# Patient Record
Sex: Female | Born: 1985
Health system: Southern US, Community
[De-identification: ages and names within clinical notes are randomized; demographics above are authoritative.]

## PROBLEM LIST (undated history)

## (undated) ENCOUNTER — Inpatient Hospital Stay (HOSPITAL_COMMUNITY): Payer: Self-pay

## (undated) DIAGNOSIS — T7840XA Allergy, unspecified, initial encounter: Secondary | ICD-10-CM

## (undated) DIAGNOSIS — L309 Dermatitis, unspecified: Secondary | ICD-10-CM

## (undated) DIAGNOSIS — H8110 Benign paroxysmal vertigo, unspecified ear: Secondary | ICD-10-CM

## (undated) DIAGNOSIS — N730 Acute parametritis and pelvic cellulitis: Secondary | ICD-10-CM

## (undated) DIAGNOSIS — F329 Major depressive disorder, single episode, unspecified: Secondary | ICD-10-CM

## (undated) DIAGNOSIS — K449 Diaphragmatic hernia without obstruction or gangrene: Secondary | ICD-10-CM

## (undated) DIAGNOSIS — D649 Anemia, unspecified: Secondary | ICD-10-CM

## (undated) DIAGNOSIS — K5090 Crohn's disease, unspecified, without complications: Secondary | ICD-10-CM

## (undated) DIAGNOSIS — N390 Urinary tract infection, site not specified: Secondary | ICD-10-CM

## (undated) DIAGNOSIS — K209 Esophagitis, unspecified without bleeding: Secondary | ICD-10-CM

## (undated) DIAGNOSIS — R011 Cardiac murmur, unspecified: Secondary | ICD-10-CM

## (undated) DIAGNOSIS — A549 Gonococcal infection, unspecified: Secondary | ICD-10-CM

## (undated) DIAGNOSIS — N200 Calculus of kidney: Secondary | ICD-10-CM

## (undated) DIAGNOSIS — A630 Anogenital (venereal) warts: Secondary | ICD-10-CM

## (undated) DIAGNOSIS — H9190 Unspecified hearing loss, unspecified ear: Secondary | ICD-10-CM

## (undated) DIAGNOSIS — A749 Chlamydial infection, unspecified: Secondary | ICD-10-CM

## (undated) DIAGNOSIS — O139 Gestational [pregnancy-induced] hypertension without significant proteinuria, unspecified trimester: Secondary | ICD-10-CM

## (undated) DIAGNOSIS — F419 Anxiety disorder, unspecified: Secondary | ICD-10-CM

## (undated) DIAGNOSIS — L509 Urticaria, unspecified: Secondary | ICD-10-CM

## (undated) DIAGNOSIS — K2970 Gastritis, unspecified, without bleeding: Secondary | ICD-10-CM

## (undated) DIAGNOSIS — F32A Depression, unspecified: Secondary | ICD-10-CM

## (undated) DIAGNOSIS — F1910 Other psychoactive substance abuse, uncomplicated: Secondary | ICD-10-CM

## (undated) HISTORY — DX: Anemia, unspecified: D64.9

## (undated) HISTORY — PX: OTHER SURGICAL HISTORY: SHX169

## (undated) HISTORY — DX: Urticaria, unspecified: L50.9

## (undated) HISTORY — DX: Allergy, unspecified, initial encounter: T78.40XA

## (undated) HISTORY — DX: Dermatitis, unspecified: L30.9

## (undated) HISTORY — DX: Other psychoactive substance abuse, uncomplicated: F19.10

---
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
## 2006-01-24 ENCOUNTER — Inpatient Hospital Stay (HOSPITAL_COMMUNITY): Admission: AD | Admit: 2006-01-24 | Discharge: 2006-01-24 | Payer: Self-pay | Admitting: Obstetrics

## 2006-02-17 ENCOUNTER — Inpatient Hospital Stay (HOSPITAL_COMMUNITY): Admission: AD | Admit: 2006-02-17 | Discharge: 2006-02-20 | Payer: Self-pay | Admitting: Obstetrics

## 2006-03-04 ENCOUNTER — Inpatient Hospital Stay (HOSPITAL_COMMUNITY): Admission: AD | Admit: 2006-03-04 | Discharge: 2006-03-05 | Payer: Self-pay | Admitting: Obstetrics

## 2006-03-18 ENCOUNTER — Inpatient Hospital Stay (HOSPITAL_COMMUNITY): Admission: AD | Admit: 2006-03-18 | Discharge: 2006-03-18 | Payer: Self-pay | Admitting: Obstetrics

## 2006-03-22 ENCOUNTER — Inpatient Hospital Stay (HOSPITAL_COMMUNITY): Admission: AD | Admit: 2006-03-22 | Discharge: 2006-03-24 | Payer: Self-pay | Admitting: Obstetrics

## 2006-05-21 ENCOUNTER — Inpatient Hospital Stay (HOSPITAL_COMMUNITY): Admission: AD | Admit: 2006-05-21 | Discharge: 2006-05-21 | Payer: Self-pay | Admitting: Obstetrics

## 2006-05-31 ENCOUNTER — Inpatient Hospital Stay (HOSPITAL_COMMUNITY): Admission: AD | Admit: 2006-05-31 | Discharge: 2006-05-31 | Payer: Self-pay | Admitting: Obstetrics

## 2006-06-05 ENCOUNTER — Inpatient Hospital Stay (HOSPITAL_COMMUNITY): Admission: AD | Admit: 2006-06-05 | Discharge: 2006-06-06 | Payer: Self-pay | Admitting: Obstetrics

## 2006-06-06 ENCOUNTER — Inpatient Hospital Stay (HOSPITAL_COMMUNITY): Admission: AD | Admit: 2006-06-06 | Discharge: 2006-06-06 | Payer: Self-pay | Admitting: Obstetrics

## 2006-06-12 ENCOUNTER — Inpatient Hospital Stay (HOSPITAL_COMMUNITY): Admission: AD | Admit: 2006-06-12 | Discharge: 2006-06-14 | Payer: Self-pay | Admitting: Obstetrics

## 2006-06-16 ENCOUNTER — Inpatient Hospital Stay (HOSPITAL_COMMUNITY): Admission: AD | Admit: 2006-06-16 | Discharge: 2006-06-16 | Payer: Self-pay | Admitting: Obstetrics

## 2006-08-10 ENCOUNTER — Emergency Department (HOSPITAL_COMMUNITY): Admission: EM | Admit: 2006-08-10 | Discharge: 2006-08-10 | Payer: Self-pay | Admitting: Emergency Medicine

## 2007-02-04 ENCOUNTER — Emergency Department (HOSPITAL_COMMUNITY): Admission: EM | Admit: 2007-02-04 | Discharge: 2007-02-05 | Payer: Self-pay | Admitting: Emergency Medicine

## 2007-05-18 ENCOUNTER — Emergency Department (HOSPITAL_COMMUNITY): Admission: EM | Admit: 2007-05-18 | Discharge: 2007-05-19 | Payer: Self-pay | Admitting: Emergency Medicine

## 2007-08-19 ENCOUNTER — Emergency Department (HOSPITAL_COMMUNITY): Admission: EM | Admit: 2007-08-19 | Discharge: 2007-08-19 | Payer: Self-pay | Admitting: *Deleted

## 2007-12-31 ENCOUNTER — Emergency Department (HOSPITAL_COMMUNITY): Admission: EM | Admit: 2007-12-31 | Discharge: 2007-12-31 | Payer: Self-pay | Admitting: Emergency Medicine

## 2008-02-10 ENCOUNTER — Emergency Department (HOSPITAL_COMMUNITY): Admission: EM | Admit: 2008-02-10 | Discharge: 2008-02-10 | Payer: Self-pay | Admitting: Emergency Medicine

## 2008-03-21 ENCOUNTER — Emergency Department (HOSPITAL_COMMUNITY): Admission: EM | Admit: 2008-03-21 | Discharge: 2008-03-21 | Payer: Self-pay | Admitting: Emergency Medicine

## 2008-04-30 ENCOUNTER — Emergency Department (HOSPITAL_COMMUNITY): Admission: EM | Admit: 2008-04-30 | Discharge: 2008-04-30 | Payer: Self-pay | Admitting: Emergency Medicine

## 2008-06-07 ENCOUNTER — Emergency Department (HOSPITAL_COMMUNITY): Admission: EM | Admit: 2008-06-07 | Discharge: 2008-06-07 | Payer: Self-pay | Admitting: Emergency Medicine

## 2008-06-10 ENCOUNTER — Emergency Department (HOSPITAL_COMMUNITY): Admission: EM | Admit: 2008-06-10 | Discharge: 2008-06-10 | Payer: Self-pay | Admitting: *Deleted

## 2008-07-22 ENCOUNTER — Emergency Department (HOSPITAL_COMMUNITY): Admission: EM | Admit: 2008-07-22 | Discharge: 2008-07-22 | Payer: Self-pay | Admitting: Emergency Medicine

## 2008-08-12 ENCOUNTER — Emergency Department (HOSPITAL_COMMUNITY): Admission: EM | Admit: 2008-08-12 | Discharge: 2008-08-12 | Payer: Self-pay | Admitting: Emergency Medicine

## 2008-11-22 ENCOUNTER — Emergency Department (HOSPITAL_COMMUNITY): Admission: EM | Admit: 2008-11-22 | Discharge: 2008-11-22 | Payer: Self-pay | Admitting: Emergency Medicine

## 2009-01-02 ENCOUNTER — Emergency Department (HOSPITAL_COMMUNITY): Admission: EM | Admit: 2009-01-02 | Discharge: 2009-01-02 | Payer: Self-pay | Admitting: Emergency Medicine

## 2009-06-17 ENCOUNTER — Emergency Department (HOSPITAL_COMMUNITY): Admission: EM | Admit: 2009-06-17 | Discharge: 2009-06-17 | Payer: Self-pay | Admitting: Emergency Medicine

## 2009-06-20 ENCOUNTER — Emergency Department (HOSPITAL_COMMUNITY): Admission: EM | Admit: 2009-06-20 | Discharge: 2009-06-21 | Payer: Self-pay | Admitting: Emergency Medicine

## 2009-07-19 ENCOUNTER — Inpatient Hospital Stay (HOSPITAL_COMMUNITY): Admission: AD | Admit: 2009-07-19 | Discharge: 2009-07-19 | Payer: Self-pay | Admitting: Obstetrics

## 2010-03-15 ENCOUNTER — Emergency Department (HOSPITAL_COMMUNITY): Admission: EM | Admit: 2010-03-15 | Discharge: 2010-03-15 | Payer: Self-pay | Admitting: Emergency Medicine

## 2010-04-01 ENCOUNTER — Inpatient Hospital Stay (HOSPITAL_COMMUNITY): Admission: EM | Admit: 2010-04-01 | Discharge: 2010-04-05 | Payer: Self-pay | Admitting: Emergency Medicine

## 2010-04-03 ENCOUNTER — Encounter (INDEPENDENT_AMBULATORY_CARE_PROVIDER_SITE_OTHER): Payer: Self-pay | Admitting: Internal Medicine

## 2010-04-06 ENCOUNTER — Emergency Department (HOSPITAL_COMMUNITY): Admission: EM | Admit: 2010-04-06 | Discharge: 2010-04-06 | Payer: Self-pay | Admitting: Emergency Medicine

## 2010-06-29 ENCOUNTER — Emergency Department (HOSPITAL_COMMUNITY): Admission: EM | Admit: 2010-06-29 | Discharge: 2010-03-26 | Payer: Self-pay | Admitting: Emergency Medicine

## 2010-08-13 ENCOUNTER — Encounter: Payer: Self-pay | Admitting: Obstetrics

## 2010-10-05 LAB — URINE CULTURE
Culture  Setup Time: 201109111102
Special Requests: NEGATIVE

## 2010-10-05 LAB — HIV ANTIBODY (ROUTINE TESTING W REFLEX): HIV: NONREACTIVE

## 2010-10-05 LAB — DIFFERENTIAL
Basophils Absolute: 0 10*3/uL (ref 0.0–0.1)
Basophils Absolute: 0 10*3/uL (ref 0.0–0.1)
Basophils Absolute: 0 10*3/uL (ref 0.0–0.1)
Basophils Absolute: 0 10*3/uL (ref 0.0–0.1)
Basophils Relative: 0 % (ref 0–1)
Basophils Relative: 1 % (ref 0–1)
Basophils Relative: 1 % (ref 0–1)
Eosinophils Absolute: 0.1 10*3/uL (ref 0.0–0.7)
Eosinophils Absolute: 0.1 10*3/uL (ref 0.0–0.7)
Eosinophils Relative: 2 % (ref 0–5)
Eosinophils Relative: 2 % (ref 0–5)
Eosinophils Relative: 3 % (ref 0–5)
Lymphocytes Relative: 33 % (ref 12–46)
Lymphocytes Relative: 35 % (ref 12–46)
Lymphocytes Relative: 38 % (ref 12–46)
Lymphs Abs: 1.2 10*3/uL (ref 0.7–4.0)
Lymphs Abs: 1.2 10*3/uL (ref 0.7–4.0)
Lymphs Abs: 2.1 10*3/uL (ref 0.7–4.0)
Monocytes Absolute: 0.4 10*3/uL (ref 0.1–1.0)
Monocytes Relative: 11 % (ref 3–12)
Monocytes Relative: 13 % — ABNORMAL HIGH (ref 3–12)
Neutro Abs: 2 10*3/uL (ref 1.7–7.7)
Neutro Abs: 2.3 10*3/uL (ref 1.7–7.7)
Neutro Abs: 3 10*3/uL (ref 1.7–7.7)
Neutrophils Relative %: 53 % (ref 43–77)
Neutrophils Relative %: 39 % — ABNORMAL LOW (ref 43–77)
Neutrophils Relative %: 46 % (ref 43–77)
Neutrophils Relative %: 49 % (ref 43–77)
Neutrophils Relative %: 50 % (ref 43–77)

## 2010-10-05 LAB — C-REACTIVE PROTEIN: CRP: 0.7 mg/dL — ABNORMAL HIGH (ref <0.6)

## 2010-10-05 LAB — CBC
HCT: 42.4 % (ref 36.0–46.0)
HCT: 35.4 % — ABNORMAL LOW (ref 36.0–46.0)
HCT: 36.2 % (ref 36.0–46.0)
HCT: 38.1 % (ref 36.0–46.0)
HCT: 40.7 % (ref 36.0–46.0)
Hemoglobin: 14 g/dL (ref 12.0–15.0)
Hemoglobin: 11.9 g/dL — ABNORMAL LOW (ref 12.0–15.0)
Hemoglobin: 13.3 g/dL (ref 12.0–15.0)
MCH: 27.2 pg (ref 26.0–34.0)
MCH: 27.1 pg (ref 26.0–34.0)
MCH: 27.6 pg (ref 26.0–34.0)
MCH: 28 pg (ref 26.0–34.0)
MCHC: 33 g/dL (ref 30.0–36.0)
MCHC: 32.7 g/dL (ref 30.0–36.0)
MCHC: 32.8 g/dL (ref 30.0–36.0)
MCHC: 33.3 g/dL (ref 30.0–36.0)
MCV: 82.5 fL (ref 78.0–100.0)
MCV: 82.8 fL (ref 78.0–100.0)
MCV: 83.1 fL (ref 78.0–100.0)
MCV: 83.4 fL (ref 78.0–100.0)
Platelets: 234 10*3/uL (ref 150–400)
Platelets: 216 10*3/uL (ref 150–400)
Platelets: 218 10*3/uL (ref 150–400)
RBC: 5.14 MIL/uL — ABNORMAL HIGH (ref 3.87–5.11)
RBC: 4.59 MIL/uL (ref 3.87–5.11)
RBC: 4.91 MIL/uL (ref 3.87–5.11)
RDW: 14.3 % (ref 11.5–15.5)
RDW: 13.4 % (ref 11.5–15.5)
RDW: 14.4 % (ref 11.5–15.5)
RDW: 14.4 % (ref 11.5–15.5)
WBC: 3.7 10*3/uL — ABNORMAL LOW (ref 4.0–10.5)
WBC: 3.2 10*3/uL — ABNORMAL LOW (ref 4.0–10.5)
WBC: 4.5 10*3/uL (ref 4.0–10.5)

## 2010-10-05 LAB — COMPREHENSIVE METABOLIC PANEL
ALT: 12 U/L (ref 0–35)
AST: 23 U/L (ref 0–37)
AST: 17 U/L (ref 0–37)
Albumin: 3.6 g/dL (ref 3.5–5.2)
Alkaline Phosphatase: 37 U/L — ABNORMAL LOW (ref 39–117)
Alkaline Phosphatase: 34 U/L — ABNORMAL LOW (ref 39–117)
BUN: 6 mg/dL (ref 6–23)
BUN: 1 mg/dL — ABNORMAL LOW (ref 6–23)
BUN: 2 mg/dL — ABNORMAL LOW (ref 6–23)
BUN: 6 mg/dL (ref 6–23)
CO2: 25 mEq/L (ref 19–32)
CO2: 24 mEq/L (ref 19–32)
CO2: 27 mEq/L (ref 19–32)
Calcium: 9.1 mg/dL (ref 8.4–10.5)
Calcium: 8.4 mg/dL (ref 8.4–10.5)
Calcium: 8.7 mg/dL (ref 8.4–10.5)
Calcium: 8.9 mg/dL (ref 8.4–10.5)
Chloride: 108 mEq/L (ref 96–112)
Chloride: 108 mEq/L (ref 96–112)
Chloride: 110 mEq/L (ref 96–112)
Chloride: 112 mEq/L (ref 96–112)
Creatinine, Ser: 1.03 mg/dL (ref 0.4–1.2)
Creatinine, Ser: 1.02 mg/dL (ref 0.4–1.2)
Creatinine, Ser: 1.16 mg/dL (ref 0.4–1.2)
Creatinine, Ser: 1.23 mg/dL — ABNORMAL HIGH (ref 0.4–1.2)
GFR calc Af Amer: >60 mL/min (ref >60)
GFR calc Af Amer: >60 mL/min (ref >60)
GFR calc non Af Amer: >60 mL/min (ref >60)
GFR calc non Af Amer: 54 mL/min — ABNORMAL LOW (ref >60)
GFR calc non Af Amer: 58 mL/min — ABNORMAL LOW (ref >60)
GFR calc non Af Amer: >60 mL/min (ref >60)
Glucose, Bld: 79 mg/dL (ref 70–99)
Glucose, Bld: 102 mg/dL — ABNORMAL HIGH (ref 70–99)
Glucose, Bld: 82 mg/dL (ref 70–99)
Glucose, Bld: 89 mg/dL (ref 70–99)
Glucose, Bld: 91 mg/dL (ref 70–99)
Potassium: 3.4 mEq/L — ABNORMAL LOW (ref 3.5–5.1)
Potassium: 3.7 mEq/L (ref 3.5–5.1)
Potassium: 3.7 mEq/L (ref 3.5–5.1)
Sodium: 140 mEq/L (ref 135–145)
Total Bilirubin: 0.7 mg/dL (ref 0.3–1.2)
Total Bilirubin: 0.4 mg/dL (ref 0.3–1.2)
Total Bilirubin: 0.5 mg/dL (ref 0.3–1.2)
Total Protein: 6.5 g/dL (ref 6.0–8.3)
Total Protein: 5.1 g/dL — ABNORMAL LOW (ref 6.0–8.3)
Total Protein: 5.4 g/dL — ABNORMAL LOW (ref 6.0–8.3)
Total Protein: 5.5 g/dL — ABNORMAL LOW (ref 6.0–8.3)

## 2010-10-05 LAB — HEMOGLOBIN A1C
Hgb A1c MFr Bld: 5.2 % (ref <5.7)
Mean Plasma Glucose: 103 mg/dL (ref <117)

## 2010-10-05 LAB — GLUCOSE, CAPILLARY
Glucose-Capillary: 110 mg/dL — ABNORMAL HIGH (ref 70–99)
Glucose-Capillary: 76 mg/dL (ref 70–99)
Glucose-Capillary: 85 mg/dL (ref 70–99)

## 2010-10-05 LAB — MAGNESIUM
Magnesium: 1.7 mg/dL (ref 1.5–2.5)
Magnesium: 1.8 mg/dL (ref 1.5–2.5)
Magnesium: 1.8 mg/dL (ref 1.5–2.5)
Magnesium: 1.9 mg/dL (ref 1.5–2.5)

## 2010-10-05 LAB — URINALYSIS, ROUTINE W REFLEX MICROSCOPIC
Bilirubin Urine: NEGATIVE
Bilirubin Urine: NEGATIVE
Bilirubin Urine: NEGATIVE
Hgb urine dipstick: NEGATIVE
Ketones, ur: NEGATIVE mg/dL
Nitrite: NEGATIVE
Nitrite: NEGATIVE
Nitrite: NEGATIVE
Specific Gravity, Urine: 1.034 — ABNORMAL HIGH (ref 1.005–1.030)
Urobilinogen, UA: 0.2 mg/dL (ref 0.0–1.0)
pH: 6 (ref 5.0–8.0)
pH: 6.5 (ref 5.0–8.0)

## 2010-10-05 LAB — URINE MICROSCOPIC-ADD ON

## 2010-10-05 LAB — CLOSTRIDIUM DIFFICILE EIA
C difficile Toxins A+B, EIA: NEGATIVE
C difficile Toxins A+B, EIA: NEGATIVE

## 2010-10-05 LAB — OVA AND PARASITE EXAMINATION

## 2010-10-05 LAB — PHOSPHORUS
Phosphorus: 3.6 mg/dL (ref 2.3–4.6)
Phosphorus: 3.7 mg/dL (ref 2.3–4.6)
Phosphorus: 4 mg/dL (ref 2.3–4.6)

## 2010-10-05 LAB — BASIC METABOLIC PANEL
CO2: 25 mEq/L (ref 19–32)
Calcium: 8.9 mg/dL (ref 8.4–10.5)
Creatinine, Ser: 0.91 mg/dL (ref 0.4–1.2)
Glucose, Bld: 105 mg/dL — ABNORMAL HIGH (ref 70–99)

## 2010-10-05 LAB — STOOL CULTURE

## 2010-10-05 LAB — POCT PREGNANCY, URINE: Preg Test, Ur: NEGATIVE

## 2010-10-05 LAB — PREGNANCY, URINE: Preg Test, Ur: NEGATIVE

## 2010-10-05 LAB — LIPASE, BLOOD: Lipase: 57 U/L (ref 11–59)

## 2010-10-05 LAB — LACTIC ACID, PLASMA: Lactic Acid, Venous: 0.6 mmol/L (ref 0.5–2.2)

## 2010-10-05 LAB — TSH: TSH: 1.591 u[IU]/mL (ref 0.350–4.500)

## 2010-10-06 LAB — URINALYSIS, ROUTINE W REFLEX MICROSCOPIC
Glucose, UA: NEGATIVE mg/dL
Protein, ur: NEGATIVE mg/dL

## 2010-10-06 LAB — URINE MICROSCOPIC-ADD ON

## 2010-10-06 LAB — WET PREP, GENITAL

## 2010-10-23 LAB — URINALYSIS, ROUTINE W REFLEX MICROSCOPIC
Glucose, UA: NEGATIVE mg/dL
Ketones, ur: 15 mg/dL — AB
pH: 6.5 (ref 5.0–8.0)

## 2010-10-23 LAB — URINE CULTURE: Colony Count: 95000

## 2010-10-23 LAB — WET PREP, GENITAL

## 2010-10-23 LAB — CBC
HCT: 37.2 % (ref 36.0–46.0)
Hemoglobin: 12.2 g/dL (ref 12.0–15.0)
RBC: 4.33 MIL/uL (ref 3.87–5.11)
WBC: 2.9 10*3/uL — ABNORMAL LOW (ref 4.0–10.5)

## 2010-10-23 LAB — URINE MICROSCOPIC-ADD ON

## 2010-10-25 LAB — POCT PREGNANCY, URINE: Preg Test, Ur: NEGATIVE

## 2010-10-25 LAB — URINALYSIS, ROUTINE W REFLEX MICROSCOPIC
Bilirubin Urine: NEGATIVE
Glucose, UA: NEGATIVE mg/dL
Ketones, ur: NEGATIVE mg/dL
Nitrite: NEGATIVE
Protein, ur: NEGATIVE mg/dL
Specific Gravity, Urine: 1.029 (ref 1.005–1.030)
Urobilinogen, UA: 1 mg/dL (ref 0.0–1.0)

## 2010-10-25 LAB — URINE MICROSCOPIC-ADD ON

## 2010-12-08 NOTE — Op Note (Signed)
Kristen Chung, HACK NO.:  [id number]   MEDICAL RECORD NO.:  [id number]          PATIENT TYPE:  AMB   LOCATION:  [place]                           FACILITY:  [facility]   PHYSICIAN:  Frederico Hamman, M.D.DATE OF BIRTH:  1985-09-30   DATE OF PROCEDURE:  12/28/2005  DATE OF DISCHARGE:                                 OPERATIVE REPORT   PREOPERATIVE DIAGNOSIS:  Incompetent cervix.   PROCEDURE:  Cervical cerclage.   SURGEON:  Frederico Hamman, M.D.   DESCRIPTION OF PROCEDURE:  After spinal anesthetic was administered, the  patient was placed in the lithotomy position.  The peritoneum and vagina  were prepped and draped.  A straight catheter with speculum placed into the  vagina.  The cervix was grasped at 12 o'clock with a sponge forceps.  A #5  Mersilene band used to place a cervical cerclage in the usual manner.  It  was tied at 6 o'clock.   The patient tolerated the procedure well.  She was taken to the recovery  room in good condition.           ______________________________  Frederico Hamman, M.D.     BAM/MEDQ  D:  12/28/2005  T:  12/28/2005  Job:  [id number]

## 2010-12-08 NOTE — Discharge Summary (Signed)
NAMENASIM, GAROFANO NO.:  [id number]   MEDICAL RECORD NO.:  [id number]          PATIENT TYPE:  INP   LOCATION:  9159                          FACILITY:  [facility]   PHYSICIAN:  Frederico Hamman, M.D.DATE OF BIRTH:  11-09-1985   DATE OF ADMISSION:  02/17/2006  DATE OF DISCHARGE:  02/20/2006                                 DISCHARGE SUMMARY   SUMMARY:  This patient is a 25 year old gravida 4, para 1, 0, 2, 1 with an  EDC of June 22, 2006.  The patient is admitted because of lower  abdominal pain and contractions.  She also has a cerclage. She received  terbutaline, but that did not stop her contractions. She was started  magnesium sulfate, 4 gram loading dose and at 22 grams per hour.  By 7:31  she had stopped contracting.  The magnesium sulfate was discontinued and she  was started Procardia 60 XL.   The patient was discharged to home on February 20, 2006 ambulatory and on a  regular diet.   DISCHARGE INSTRUCTIONS:  The patient is on total bedrest.  She is to take  Procardia 60 XL.  The patient is to see me in four weeks.   DISCHARGE DIAGNOSES:  Status post therapy for premature contractions.           ______________________________  Frederico Hamman, M.D.     BAM/MEDQ  D:  05/22/2006  T:  05/22/2006  Job:  [id number]

## 2010-12-08 NOTE — Discharge Summary (Signed)
[place]  Patient:    Kristen Chung, Kristen Chung Visit Number: [id number] MRN: [id number]          Service Type: OBS Location: 910A 9110 01 Attending Physician:  Renda Rolls Dictated by:   Bonna Gains, C.N.M. Admit Date:  07/07/2001 Discharge Date: 08/03/2001                             Discharge Summary  HISTORY OF PRESENT ILLNESS:   This is a 25 year old primigravida who presented at 66 weeks with LMP dating consistent with an 18-week scan, stating that she had spontaneous rupture of membranes with a moderate amount of clear fluid on the day of admission.  She also was experiencing some cramping.  She denied bleeding.  She had good fetal movement.  Prenatal care was at [facility] and essentially uncomplicated, but apparently had a lapse of care prior to admission for several weeks.  PAST OBSTETRICAL HISTORY:     Primigravida.  PAST GYNECOLOGICAL HISTORY:   Negative for STDs.  PAST MEDICAL HISTORY:         Noncontributory.  PAST SURGICAL HISTORY:        Noncontributory.  ALLERGIES:                    None.  MEDICATIONS:                  Prenatal vitamins.  SOCIAL HISTORY:               An adolescent, 9th grader at [employer]. Negative for substance abuse.  Significant family discord.  PRENATAL LABORATORY DATA:     A+.  Antibody screen negative.  Hemoglobin 11, platelets 227.  Rubella immune.  Hepatitis B surface antigen negative.  RPR negative.  HIV not done.  Gonorrhea negative.  Chlamydia negative.  PHYSICAL EXAMINATION:         Vital signs with temperature 98 degrees, pulse 77, and BP 122/64.  ABDOMEN:                      Soft and nontender consistent with 26-week size. No fundal tenderness.  EXTREMITIES:                  1+ DTRs.  No edema.  LABORATORY DATA:              Wet prep with a few bacteria and a few wbcs, otherwise negative.  Toco with no uterine contractions.  EFM:  The fetal heart rate was in the  140s without significant decelerations.  ASSESSMENT:                   A 25 year old primigravida at 62 weeks with spontaneous rupture of membranes, not laboring.  PLAN:                         Hospitalization.  GC, chlamydia, and GBS were done.  The plan was to give steroids, have social services consult, get her on a homebound teaching protocol, and see a nutritionist.  HOSPITAL COURSE:              On hospital day #1, the chlamydia culture came back positive.  She was treated with azithromycin 1 g p.o.  Her ultrasound showed an AFI of 4.0.  The cervical length was 4.6.  Of note, the follow-up ultrasound done  on July 30, 2001, showed AFI down to 2.4 with a cervical length of 1.7.  She received betamethasone 12.5 mg repeated once in 24 hours and was started on Unasyn IV.  Her beta streptococcus culture came back positive.  Because of the prolonged antibiotics, she was started on a PICC line for her IV antibiotics.  She received 11 days of Unasyn and was changed to ampicillin on day #12.  She remained afebrile and did not have abdominal tenderness.  Toco revealed only a rare uterine contraction.  The cervix exam was deferred.  During the hospitalization, she saw the nutritionist and the social worker and had a tour of the NICU.  On August 01, 2001, she began reporting significant lower abdominal pain.  On exam, her cervix was noted to be 4, 90, and 0 station.  That night, she went on to deliver spontaneously a female with Apgars of 2 at one minute and 7 at five minutes by Agnes Lawrence, M.D.  The infant went to the NICU as he was [redacted] weeks gestational age.  Her postpartum course was normal.  DISPOSITION:                  She was discharged on August 02, 2001.  DISCHARGE MEDICATIONS:        Prenatal vitamins one a day and iron one p.o. q.d.  FOLLOW-UP:                    Her follow-up was to be at six weeks a [employer]. Dictated by:   Bonna Gains, C.N.M. Attending  Physician:  Renda Rolls DD:  09/01/01 TD:  09/01/01 Job: [id number]

## 2010-12-08 NOTE — Group Therapy Note (Signed)
NAME:  Kristen Chung, Kristen Chung NO.:  [id number]   MEDICAL RECORD NO.:  [id number]          PATIENT TYPE:  WOC   LOCATION:  [facility]                   FACILITY:  [facility]   PHYSICIAN:  Darron Doom, MD        DATE OF BIRTH:  [birth year]   DATE OF SERVICE:  06/29/2004                                    CLINIC NOTE   CHIEF COMPLAINT:  Left breast lump.   HISTORY OF PRESENT ILLNESS:  The patient is a 25 year old G2 P0-1-1-1 who  began having some nipple pain bilaterally approximately a week ago.  Since  that time she has noted increasing pain with stretching; some onset of  dizziness, shortness of breath, and sweating; as well as new breast lump in  the left breast.  The patient was seen in the ED for this approximately 2  days ago, had a negative chest x-ray, normal pulse oximetry, normal  electrolytes, normal hemoglobin, and negative urine pregnancy test.  She was  noted to have a left breast mass at the time and she is referred here for  follow-up.   PAST MEDICAL HISTORY:  Negative.   PAST SURGICAL HISTORY:  Negative.   MEDICATIONS:  She is on naproxen.   ALLERGIES:  None known.   OBSTETRICAL HISTORY:  She is a G2 P1 with a one termination, one vaginal  delivery at 7 months after PPROM at 6 months.   GYNECOLOGICAL HISTORY:  Menarche at age 10.  Cycles every month, last 3-4  days.  She is on no birth control currently.  Her LMP is June 06, 2004.  Last Pap smear was at the [facility] in July 2005.  She does have a  history of abnormal Pap with no significant treatment done.   FAMILY HISTORY:  Significant for coronary artery disease and diabetes.   SOCIAL HISTORY:  The patient lives with her father, step-mother, foster  brother, and her 64-year-old son.  The patient also is going to school at  night, back to [employer].  She has been out of school for approximately 2  years.  She expresses a lot of anger and frustration at her current living  situation and a  long discussion was had with this patient regarding choices  and options for her.   PHYSICAL EXAMINATION:  VITAL SIGNS:  Temperature is 98.1, pulse 82, blood  pressure 132/73, weight 122.9.  GENERAL:  She is a well-developed, well-nourished black female in no acute  distress  BREASTS:  She has Tanner stage 4 breasts and she has some cystic changes.  She has everted nipples and a scant amount of clear nipple discharge is  noted on the left.  There is no supraclavicular or axillary adenopathy.  The  right breast is within normal limits.  The left breast has some cystic  change noted, as well as a firm nodule at the 12 o'clock position.  The  nodule is very small, less than a centimeter by about a half a centimeter.   IMPRESSION:  1.  Breast mass.  2.  Acute anxiety attacks.  3.  Contraceptive counseling.  PLAN:  1.  Will refer her to surgeon for evaluation of breast mass.  2.  Have given her NuvaRing samples, information, and prescription to be      used as needed for birth control.  3.  Follow up as needed.      TP/MEDQ  D:  06/29/2004  T:  06/30/2004  Job:  [id number]

## 2011-02-21 ENCOUNTER — Emergency Department (HOSPITAL_COMMUNITY)
Admission: EM | Admit: 2011-02-21 | Discharge: 2011-02-21 | Disposition: A | Discharge status: Home / Self Care | Payer: Self-pay | Attending: Emergency Medicine | Admitting: Emergency Medicine

## 2011-02-21 ENCOUNTER — Emergency Department (HOSPITAL_COMMUNITY): Payer: Self-pay

## 2011-02-21 ENCOUNTER — Encounter (HOSPITAL_COMMUNITY): Payer: Self-pay

## 2011-02-21 DIAGNOSIS — K5289 Other specified noninfective gastroenteritis and colitis: Secondary | ICD-10-CM | POA: Insufficient documentation

## 2011-02-21 DIAGNOSIS — R197 Diarrhea, unspecified: Secondary | ICD-10-CM | POA: Insufficient documentation

## 2011-02-21 DIAGNOSIS — R109 Unspecified abdominal pain: Secondary | ICD-10-CM | POA: Insufficient documentation

## 2011-02-21 LAB — URINALYSIS, ROUTINE W REFLEX MICROSCOPIC
Glucose, UA: NEGATIVE mg/dL
Hgb urine dipstick: NEGATIVE
Protein, ur: NEGATIVE mg/dL
Specific Gravity, Urine: 1.009 (ref 1.005–1.030)
pH: 6.5 (ref 5.0–8.0)

## 2011-02-21 LAB — COMPREHENSIVE METABOLIC PANEL
ALT: 17 U/L (ref 0–35)
AST: 27 U/L (ref 0–37)
Albumin: 3.6 g/dL (ref 3.5–5.2)
Alkaline Phosphatase: 59 U/L (ref 39–117)
Calcium: 9.6 mg/dL (ref 8.4–10.5)
Potassium: 3.3 mEq/L — ABNORMAL LOW (ref 3.5–5.1)
Sodium: 138 mEq/L (ref 135–145)
Total Protein: 7.2 g/dL (ref 6.0–8.3)

## 2011-02-21 LAB — URINE MICROSCOPIC-ADD ON

## 2011-02-21 LAB — CBC
Hemoglobin: 12.6 g/dL (ref 12.0–15.0)
MCH: 27.3 pg (ref 26.0–34.0)
MCHC: 31.8 g/dL (ref 30.0–36.0)
Platelets: 252 10*3/uL (ref 150–400)
RDW: 13.7 % (ref 11.5–15.5)

## 2011-02-21 LAB — DIFFERENTIAL
Basophils Absolute: 0 10*3/uL (ref 0.0–0.1)
Basophils Relative: 1 % (ref 0–1)
Eosinophils Absolute: 0.1 10*3/uL (ref 0.0–0.7)
Eosinophils Relative: 1 % (ref 0–5)
Monocytes Absolute: 0.5 10*3/uL (ref 0.1–1.0)
Neutro Abs: 2.9 10*3/uL (ref 1.7–7.7)

## 2011-02-21 MED ORDER — IOHEXOL 300 MG/ML  SOLN
100.0000 mL | Freq: Once | INTRAMUSCULAR | Status: AC | PRN
  Administered 2011-02-21: 100 mL via INTRAVENOUS

## 2011-04-12 LAB — CBC
HCT: 35.1 — ABNORMAL LOW
Hemoglobin: 11.4 — ABNORMAL LOW
MCV: 80.4
Platelets: 251
RDW: 13.2

## 2011-04-12 LAB — DIFFERENTIAL
Basophils Absolute: 0.1
Basophils Relative: 1
Lymphocytes Relative: 47 — ABNORMAL HIGH
Monocytes Absolute: 0.6
Neutro Abs: 2.6
Neutrophils Relative %: 40 — ABNORMAL LOW

## 2011-04-12 LAB — URINALYSIS, ROUTINE W REFLEX MICROSCOPIC
Nitrite: NEGATIVE
Specific Gravity, Urine: 1.021
Urobilinogen, UA: 1
pH: 7.5

## 2011-04-12 LAB — COMPREHENSIVE METABOLIC PANEL
Albumin: 3.6
Alkaline Phosphatase: 50
BUN: 10
Creatinine, Ser: 1.05
Glucose, Bld: 95
Potassium: 3.3 — ABNORMAL LOW
Total Bilirubin: 0.3
Total Protein: 6.9

## 2011-04-12 LAB — URINE MICROSCOPIC-ADD ON

## 2011-04-19 LAB — URINALYSIS, ROUTINE W REFLEX MICROSCOPIC
Glucose, UA: NEGATIVE
Ketones, ur: NEGATIVE
Nitrite: NEGATIVE
Specific Gravity, Urine: >1.03 — ABNORMAL HIGH
pH: 6

## 2011-04-19 LAB — URINE MICROSCOPIC-ADD ON

## 2011-04-19 LAB — POCT PREGNANCY, URINE
Operator id: 29502
Preg Test, Ur: NEGATIVE

## 2011-04-20 LAB — URINE MICROSCOPIC-ADD ON

## 2011-04-20 LAB — URINALYSIS, ROUTINE W REFLEX MICROSCOPIC
Bilirubin Urine: NEGATIVE
Ketones, ur: NEGATIVE
Nitrite: NEGATIVE
Protein, ur: 30 — AB
pH: 6

## 2011-04-20 LAB — PREGNANCY, URINE: Preg Test, Ur: NEGATIVE

## 2011-04-23 LAB — URINE MICROSCOPIC-ADD ON

## 2011-04-23 LAB — URINALYSIS, ROUTINE W REFLEX MICROSCOPIC
Ketones, ur: NEGATIVE
Nitrite: NEGATIVE
Protein, ur: NEGATIVE
pH: 7

## 2011-04-24 LAB — URINE MICROSCOPIC-ADD ON

## 2011-04-24 LAB — URINALYSIS, ROUTINE W REFLEX MICROSCOPIC
Bilirubin Urine: NEGATIVE
Glucose, UA: NEGATIVE
Hgb urine dipstick: NEGATIVE
Hgb urine dipstick: NEGATIVE
Ketones, ur: NEGATIVE
Protein, ur: NEGATIVE
Specific Gravity, Urine: 1.019
Urobilinogen, UA: 0.2

## 2011-04-24 LAB — RPR: RPR Ser Ql: NONREACTIVE

## 2011-04-24 LAB — POCT PREGNANCY, URINE: Preg Test, Ur: NEGATIVE

## 2011-04-24 LAB — GC/CHLAMYDIA PROBE AMP, GENITAL: Chlamydia, DNA Probe: NEGATIVE

## 2011-04-26 LAB — DIFFERENTIAL
Basophils Relative: 1 % (ref 0–1)
Eosinophils Absolute: 0 10*3/uL (ref 0.0–0.7)
Eosinophils Relative: 0 % (ref 0–5)
Monocytes Absolute: 0.2 10*3/uL (ref 0.1–1.0)
Monocytes Relative: 4 % (ref 3–12)

## 2011-04-26 LAB — POCT I-STAT, CHEM 8
Creatinine, Ser: 0.8 mg/dL (ref 0.4–1.2)
Glucose, Bld: 99 mg/dL (ref 70–99)
Hemoglobin: 13.9 g/dL (ref 12.0–15.0)
Potassium: 3.3 mEq/L — ABNORMAL LOW (ref 3.5–5.1)

## 2011-04-26 LAB — CBC
HCT: 39.2 % (ref 36.0–46.0)
Hemoglobin: 12.6 g/dL (ref 12.0–15.0)
MCHC: 32 g/dL (ref 30.0–36.0)
MCV: 82.8 fL (ref 78.0–100.0)
RBC: 4.74 MIL/uL (ref 3.87–5.11)

## 2011-04-26 LAB — POCT CARDIAC MARKERS: CKMB, poc: <1 ng/mL — ABNORMAL LOW (ref 1.0–8.0)

## 2011-05-02 ENCOUNTER — Emergency Department (HOSPITAL_COMMUNITY)
Admission: EM | Admit: 2011-05-02 | Discharge: 2011-05-03 | Disposition: A | Discharge status: Home / Self Care | Payer: Self-pay | Attending: Emergency Medicine | Admitting: Emergency Medicine

## 2011-05-02 DIAGNOSIS — Z79899 Other long term (current) drug therapy: Secondary | ICD-10-CM | POA: Insufficient documentation

## 2011-05-02 DIAGNOSIS — B3731 Acute candidiasis of vulva and vagina: Secondary | ICD-10-CM | POA: Insufficient documentation

## 2011-05-02 DIAGNOSIS — R112 Nausea with vomiting, unspecified: Secondary | ICD-10-CM | POA: Insufficient documentation

## 2011-05-02 DIAGNOSIS — B373 Candidiasis of vulva and vagina: Secondary | ICD-10-CM | POA: Insufficient documentation

## 2011-05-02 DIAGNOSIS — K6289 Other specified diseases of anus and rectum: Secondary | ICD-10-CM | POA: Insufficient documentation

## 2011-05-02 DIAGNOSIS — M545 Low back pain, unspecified: Secondary | ICD-10-CM | POA: Insufficient documentation

## 2011-05-02 DIAGNOSIS — R1084 Generalized abdominal pain: Secondary | ICD-10-CM | POA: Insufficient documentation

## 2011-05-02 DIAGNOSIS — K5090 Crohn's disease, unspecified, without complications: Secondary | ICD-10-CM | POA: Insufficient documentation

## 2011-05-02 LAB — DIFFERENTIAL
Basophils Absolute: 0.1
Basophils Relative: 2 — ABNORMAL HIGH
Eosinophils Absolute: 0.2
Eosinophils Relative: 3
Lymphocytes Relative: 43
Lymphs Abs: 2.5
Monocytes Absolute: 0.5
Monocytes Relative: 9
Neutro Abs: 2.5
Neutrophils Relative %: 44

## 2011-05-02 LAB — URINE MICROSCOPIC-ADD ON

## 2011-05-02 LAB — URINALYSIS, ROUTINE W REFLEX MICROSCOPIC
Glucose, UA: NEGATIVE
Protein, ur: NEGATIVE
Urobilinogen, UA: 1

## 2011-05-02 LAB — GC/CHLAMYDIA PROBE AMP, GENITAL
Chlamydia, DNA Probe: NEGATIVE
GC Probe Amp, Genital: NEGATIVE

## 2011-05-02 LAB — WET PREP, GENITAL
Clue Cells Wet Prep HPF POC: NONE SEEN
Trich, Wet Prep: NONE SEEN
Yeast Wet Prep HPF POC: NONE SEEN

## 2011-05-02 LAB — CBC
HCT: 34.6 — ABNORMAL LOW
Hemoglobin: 11.4 — ABNORMAL LOW
MCHC: 33
MCV: 81.6
Platelets: 260
RBC: 4.24
RDW: 13.3
WBC: 5.8

## 2011-05-03 ENCOUNTER — Encounter (HOSPITAL_COMMUNITY): Payer: Self-pay

## 2011-05-03 ENCOUNTER — Emergency Department (HOSPITAL_COMMUNITY): Payer: Self-pay

## 2011-05-03 LAB — CBC
HCT: 37.5 % (ref 36.0–46.0)
MCV: 88.2 fL (ref 78.0–100.0)
Platelets: 191 10*3/uL (ref 150–400)
RBC: 4.25 MIL/uL (ref 3.87–5.11)
WBC: 6.1 10*3/uL (ref 4.0–10.5)

## 2011-05-03 LAB — COMPREHENSIVE METABOLIC PANEL
ALT: 14 U/L (ref 0–35)
AST: 21 U/L (ref 0–37)
Albumin: 3.2 g/dL — ABNORMAL LOW (ref 3.5–5.2)
Alkaline Phosphatase: 50 U/L (ref 39–117)
CO2: 26 mEq/L (ref 19–32)
Chloride: 102 mEq/L (ref 96–112)
Creatinine, Ser: 0.79 mg/dL (ref 0.50–1.10)
GFR calc Af Amer: >90 mL/min (ref >90)
Potassium: 3.5 mEq/L (ref 3.5–5.1)

## 2011-05-03 LAB — DIFFERENTIAL
Basophils Absolute: 0 10*3/uL (ref 0.0–0.1)
Eosinophils Absolute: 0.1 10*3/uL (ref 0.0–0.7)
Lymphocytes Relative: 40 % (ref 12–46)
Lymphs Abs: 2.4 10*3/uL (ref 0.7–4.0)
Neutrophils Relative %: 52 % (ref 43–77)

## 2011-05-03 LAB — URINALYSIS, ROUTINE W REFLEX MICROSCOPIC
Glucose, UA: NEGATIVE mg/dL
Leukocytes, UA: NEGATIVE
Nitrite: NEGATIVE
pH: 8.5 — ABNORMAL HIGH (ref 5.0–8.0)

## 2011-05-03 LAB — WET PREP, GENITAL: Trich, Wet Prep: NONE SEEN

## 2011-05-03 LAB — LIPASE, BLOOD: Lipase: 45 U/L (ref 11–59)

## 2011-05-03 LAB — GC/CHLAMYDIA PROBE AMP, GENITAL: GC Probe Amp, Genital: NEGATIVE

## 2011-05-03 MED ORDER — IOHEXOL 300 MG/ML  SOLN
100.0000 mL | Freq: Once | INTRAMUSCULAR | Status: AC | PRN
  Administered 2011-05-03: 100 mL via INTRAVENOUS

## 2011-08-22 ENCOUNTER — Encounter (HOSPITAL_COMMUNITY): Payer: Self-pay | Admitting: *Deleted

## 2011-08-22 ENCOUNTER — Emergency Department (HOSPITAL_COMMUNITY)
Admission: EM | Admit: 2011-08-22 | Discharge: 2011-08-22 | Disposition: A | Discharge status: Home / Self Care | Payer: Self-pay | Attending: Emergency Medicine | Admitting: Emergency Medicine

## 2011-08-22 DIAGNOSIS — K612 Anorectal abscess: Secondary | ICD-10-CM | POA: Insufficient documentation

## 2011-08-22 DIAGNOSIS — K6289 Other specified diseases of anus and rectum: Secondary | ICD-10-CM | POA: Insufficient documentation

## 2011-08-22 DIAGNOSIS — K5090 Crohn's disease, unspecified, without complications: Secondary | ICD-10-CM | POA: Insufficient documentation

## 2011-08-22 DIAGNOSIS — K610 Anal abscess: Secondary | ICD-10-CM

## 2011-08-22 HISTORY — DX: Crohn's disease, unspecified, without complications: K50.90

## 2011-08-22 MED ORDER — AMOXICILLIN-POT CLAVULANATE 875-125 MG PO TABS: 1.0000 | ORAL_TABLET | Freq: Two times a day (BID) | ORAL | Status: AC

## 2011-08-22 MED ORDER — HYDROCODONE-ACETAMINOPHEN 5-325 MG PO TABS: 2.0000 | ORAL_TABLET | ORAL | Status: AC | PRN

## 2011-08-22 MED ORDER — DOCUSATE SODIUM 100 MG PO CAPS: 100.0000 mg | ORAL_CAPSULE | Freq: Two times a day (BID) | ORAL | Status: AC

## 2011-08-22 NOTE — ED Provider Notes (Addendum)
History     CSN: [id number]  Arrival date & time 08/22/11  1735   First MD Initiated Contact with Patient 08/22/11 1816      Chief Complaint  Patient presents with  . Recurrent Skin Infections    (Consider location/radiation/quality/duration/timing/severity/associated sxs/prior treatment) HPI Comments: Patient reports painful lesion the buttocks for the past 3 weeks. The pain comes and goes. It is near her anus. She denies any bleeding, discharge, fever, vomiting. No abdominal pain.  Patient reports history of Crohn's disease but on record review she had a nonspecific colitis in the past. No history of fitulas or abscesses. No abdominal surgeries. No blood in the stools  The history is provided by the patient.    Past Medical History  Diagnosis Date  . Crohn disease     History reviewed. No pertinent past surgical history.  History reviewed. No pertinent family history.  History  Substance Use Topics  . Smoking status: Current Everyday Smoker    Types: Cigarettes  . Smokeless tobacco: Not on file  . Alcohol Use: Yes     occ    OB History    Grav Para Term Preterm Abortions TAB SAB Ect Mult Living                  Review of Systems  Constitutional: Negative for fever, activity change and appetite change.  HENT: Negative for congestion and rhinorrhea.   Respiratory: Negative for chest tightness.   Gastrointestinal: Positive for rectal pain. Negative for nausea, vomiting, blood in stool and anal bleeding.  Genitourinary: Negative for dysuria, vaginal bleeding and vaginal discharge.  Musculoskeletal: Negative for back pain.  Skin: Negative for rash.  Neurological: Negative for headaches.    Allergies  Dilaudid  Home Medications   Current Outpatient Rx  Name Route Sig Dispense Refill  . ACETAMINOPHEN 500 MG PO TABS Oral Take 1,000 mg by mouth every 6 (six) hours as needed. For pain      BP 97/70  Pulse 79  Temp(Src) 97.7 F (36.5 C) (Oral)  Resp 18  Ht  5' 3"  (1.6 m)  SpO2 100%  Physical Exam  Constitutional: She is oriented to person, place, and time. She appears well-developed and well-nourished. No distress.  HENT:  Head: Normocephalic and atraumatic.  Mouth/Throat: Oropharynx is clear and moist. No oropharyngeal exudate.  Eyes: Conjunctivae are normal. Pupils are equal, round, and reactive to light.  Neck: Normal range of motion. Neck supple.  Cardiovascular: Normal rate, regular rhythm and normal heart sounds.   Pulmonary/Chest: Effort normal and breath sounds normal. No respiratory distress.  Abdominal: Soft. There is no tenderness. There is no rebound and no guarding.       Nurse Margarita Grizzle chaperone Small area of induration and 10:00 position perianally No fluctuance, no external hemorrhoids no fissures Area tender to palpation  Musculoskeletal: Normal range of motion.  Neurological: She is alert and oriented to person, place, and time. No cranial nerve deficit.  Skin: Skin is warm.    ED Course  Procedures (including critical care time)  Labs Reviewed - No data to display No results found.   No diagnosis found.    MDM  Perianal pain with area of induration. No fluctuance at this time.  We'll treat as early perianal abscess.  No abscess or fluid collections to drain.  Abdomen soft and nontender.  Have recheck in urgent care in 2 days  Patient left before receiving discharge instructions or prescriptions.      Annie Main  Jonanthony Nahar, MD 08/22/11 1839  Ezequiel Essex, MD 08/22/11 5198

## 2011-08-22 NOTE — ED Notes (Signed)
Reports having boil on buttock, causing severe pain. No acute distress noted at triage.

## 2011-10-10 ENCOUNTER — Encounter (HOSPITAL_COMMUNITY): Payer: Self-pay | Admitting: Adult Health

## 2011-10-10 ENCOUNTER — Emergency Department (HOSPITAL_COMMUNITY)
Admission: EM | Admit: 2011-10-10 | Discharge: 2011-10-11 | Disposition: A | Discharge status: Home / Self Care | Payer: Self-pay | Attending: Emergency Medicine | Admitting: Emergency Medicine

## 2011-10-10 DIAGNOSIS — K612 Anorectal abscess: Secondary | ICD-10-CM | POA: Insufficient documentation

## 2011-10-10 DIAGNOSIS — K5090 Crohn's disease, unspecified, without complications: Secondary | ICD-10-CM | POA: Insufficient documentation

## 2011-10-10 DIAGNOSIS — L0291 Cutaneous abscess, unspecified: Secondary | ICD-10-CM

## 2011-10-10 DIAGNOSIS — F17200 Nicotine dependence, unspecified, uncomplicated: Secondary | ICD-10-CM | POA: Insufficient documentation

## 2011-10-10 MED ORDER — LIDOCAINE HCL 2 % IJ SOLN
5.0000 mL | Freq: Once | INTRAMUSCULAR | Status: AC
  Administered 2011-10-11: 100 mg via INTRADERMAL
  Filled 2011-10-10: qty 1

## 2011-10-10 MED ORDER — IBUPROFEN 800 MG PO TABS
800.0000 mg | ORAL_TABLET | Freq: Once | ORAL | Status: AC
  Administered 2011-10-10: 800 mg via ORAL
  Filled 2011-10-10: qty 1

## 2011-10-10 NOTE — ED Notes (Signed)
Noticed a boil inbetween buttocks one month ago, seen at [facility] and sent home. Today has pain and states the boil is larger. Unable to sit down.

## 2011-10-11 MED ORDER — IBUPROFEN 800 MG PO TABS: 800.0000 mg | ORAL_TABLET | Freq: Four times a day (QID) | ORAL | Status: AC | PRN

## 2011-10-11 MED ORDER — OXYCODONE-ACETAMINOPHEN 5-325 MG PO TABS: 2.0000 | ORAL_TABLET | ORAL | Status: AC | PRN

## 2011-10-11 MED ORDER — SULFAMETHOXAZOLE-TRIMETHOPRIM 800-160 MG PO TABS: 1.0000 | ORAL_TABLET | Freq: Two times a day (BID) | ORAL | Status: AC

## 2011-10-11 NOTE — ED Provider Notes (Signed)
History     CSN: [id number]  Arrival date & time 10/10/11  1924   First MD Initiated Contact with Patient 10/10/11 2344      Chief Complaint  Patient presents with  . Recurrent Skin Infections    (Consider location/radiation/quality/duration/timing/severity/associated sxs/prior treatment) Patient is a 26 y.o. female presenting with abscess. The history is provided by the patient. No language interpreter was used.  Abscess  This is a recurrent problem. The current episode started more than one week ago. The onset was gradual. The problem has been gradually worsening. Affected Location: center buttuck. The abscess is characterized by redness, painfulness and swelling. Pertinent negatives include no fever and no vomiting. Recently, medical care has been given at this facility.   Patient reports a boil in her buttock one month ago. States that she came to [facility] and they did not drain the abscess but put her on antibiotics. Patient states that she never got the antibiotics filled.  The boil has gotten bigger and she is having trouble sitting now.   Past Medical History  Diagnosis Date  . Crohn disease     History reviewed. No pertinent past surgical history.  History reviewed. No pertinent family history.  History  Substance Use Topics  . Smoking status: Current Everyday Smoker    Types: Cigarettes  . Smokeless tobacco: Not on file  . Alcohol Use: Yes     occ    OB History    Grav Para Term Preterm Abortions TAB SAB Ect Mult Living                  Review of Systems  Constitutional: Negative for fever and diaphoresis.  Respiratory: Negative for shortness of breath.   Gastrointestinal: Negative for nausea and vomiting.  Skin:       Recurring abscess to buttocks  Neurological: Negative for dizziness, weakness and light-headedness.    Allergies  Dilaudid  Home Medications   Current Outpatient Rx  Name Route Sig Dispense Refill  . ACETAMINOPHEN 500 MG PO TABS  Oral Take 1,000 mg by mouth every 6 (six) hours as needed. For pain      BP 140/99  Pulse 56  Temp(Src) 98.8 F (37.1 C) (Oral)  Resp 16  SpO2 100%  Physical Exam  Constitutional: She is oriented to person, place, and time. She appears well-developed and well-nourished. No distress.  HENT:  Head: Normocephalic.  Eyes: Pupils are equal, round, and reactive to light.  Neck: Normal range of motion.  Cardiovascular: Normal rate.   Pulmonary/Chest: Effort normal.  Neurological: She is alert and oriented to person, place, and time.  Skin: Skin is warm and dry.       Peri-anal abscess 3cm tender with no drainage  Psychiatric: She has a normal mood and affect.    ED Course  INCISION AND DRAINAGE Date/Time: 10/10/2011 11:55 PM Performed by: Sheryle Hail Authorized by: Sheryle Hail Consent: Verbal consent obtained. Written consent not obtained. Risks and benefits: risks, benefits and alternatives were discussed Consent given by: patient Patient understanding: patient states understanding of the procedure being performed Imaging studies: imaging studies not available Patient identity confirmed: verbally with patient, arm band, provided demographic data and hospital-assigned identification number Time out: Immediately prior to procedure a "time out" was called to verify the correct patient, procedure, equipment, support staff and site/side marked as required. Type: abscess Location: peri-anal. Anesthetic total: 4 ml Scalpel size: 11 Needle gauge: 22 Incision type: single straight Complexity: simple  Drainage: purulent and serosanguinous Drainage amount: moderate Wound treatment: wound left open Packing material: 1/4 in iodoform gauze Patient tolerance: Patient tolerated the procedure well with no immediate complications.   (including critical care time)  Labs Reviewed - No data to display No results found.   No diagnosis found.    MDM  I & D perianal abscess 3cm  with serosang drainage.  Local infiltration of lidocaine. Percocet for pain.  Bactrim DS rx.  Patient will return in 2 days for recheck.          Sheryle Hail, NP 10/12/11 1037

## 2011-10-11 NOTE — Discharge Instructions (Signed)
Kristen Chung is hot compresses and squeezed this abscess 3 times a day. Come back in 2 days to have the packing removed although it might fall out in the meantime. Start antibiotics as soon as possible and finished all of them. If abscess comes back and gets larger we may have to have a surgeon to remove the abscess.    Cellulitis Cellulitis is an infection of the tissue under the skin. The infected area is usually red and tender. This is caused by germs. These germs enter the body through cuts or sores. This usually happens in the arms or lower legs. HOME CARE   Take your medicine as told. Finish it even if you start to feel better.   If the infection is on the arm or leg, keep it raised (elevated).   Use a warm cloth on the infected area several times a day.   See your doctor for a follow-up visit as told.  GET HELP RIGHT AWAY IF:   You are tired or confused.   You throw up (vomit).   You have watery poop (diarrhea).   You feel ill and have muscle aches.   You have a fever.  MAKE SURE YOU:   Understand these instructions.   Will watch your condition.   Will get help right away if you are not doing well or get worse.  Document Released: 12/26/2007 Document Revised: 06/28/2011 Document Reviewed: 06/10/2009 [facility] Patient Information 2012 [place].

## 2011-10-13 NOTE — ED Provider Notes (Signed)
Medical screening examination/treatment/procedure(s) were performed by non-physician practitioner and as supervising physician I was immediately available for consultation/collaboration.  Veryl Speak, MD 10/13/11 1145

## 2011-11-19 ENCOUNTER — Telehealth: Payer: Self-pay

## 2011-11-19 NOTE — Telephone Encounter (Signed)
Opened in error

## 2011-12-24 ENCOUNTER — Encounter (HOSPITAL_COMMUNITY): Payer: Self-pay | Admitting: *Deleted

## 2011-12-24 ENCOUNTER — Inpatient Hospital Stay (HOSPITAL_COMMUNITY)
Admission: AD | Admit: 2011-12-24 | Discharge: 2011-12-24 | Disposition: A | Discharge status: Home / Self Care | Payer: Self-pay | Source: Ambulatory Visit | Attending: Obstetrics | Admitting: Obstetrics

## 2011-12-24 DIAGNOSIS — N63 Unspecified lump in unspecified breast: Secondary | ICD-10-CM | POA: Insufficient documentation

## 2011-12-24 DIAGNOSIS — N949 Unspecified condition associated with female genital organs and menstrual cycle: Secondary | ICD-10-CM | POA: Insufficient documentation

## 2011-12-24 DIAGNOSIS — N6320 Unspecified lump in the left breast, unspecified quadrant: Secondary | ICD-10-CM

## 2011-12-24 DIAGNOSIS — N766 Ulceration of vulva: Secondary | ICD-10-CM

## 2011-12-24 HISTORY — DX: Anogenital (venereal) warts: A63.0

## 2011-12-24 HISTORY — DX: Anxiety disorder, unspecified: F41.9

## 2011-12-24 HISTORY — DX: Major depressive disorder, single episode, unspecified: F32.9

## 2011-12-24 HISTORY — DX: Gonococcal infection, unspecified: A54.9

## 2011-12-24 HISTORY — DX: Urinary tract infection, site not specified: N39.0

## 2011-12-24 HISTORY — DX: Chlamydial infection, unspecified: A74.9

## 2011-12-24 HISTORY — DX: Calculus of kidney: N20.0

## 2011-12-24 HISTORY — DX: Gestational (pregnancy-induced) hypertension without significant proteinuria, unspecified trimester: O13.9

## 2011-12-24 HISTORY — DX: Acute parametritis and pelvic cellulitis: N73.0

## 2011-12-24 HISTORY — DX: Depression, unspecified: F32.A

## 2011-12-24 HISTORY — DX: Cardiac murmur, unspecified: R01.1

## 2011-12-24 MED ORDER — LIDOCAINE 5 % EX OINT
TOPICAL_OINTMENT | Freq: Once | CUTANEOUS | Status: AC
  Administered 2011-12-24: 19:00:00 via TOPICAL
  Filled 2011-12-24: qty 35.44

## 2011-12-24 NOTE — Discharge Instructions (Signed)
Fibrocystic Breast Changes Fibrocystic breast changes is a non-cancerous(benign) condition that about half of all women have at some time in their life. It is also called benign breast disease and mammary dysplasia. It may also be called fibrocystic breast disease, but it is not really a disease. It is a common condition that occurs when women go through the hormonal changes during their menstrual cycle, between the ages of 63 to 96. Menopausal women do not have this problem, unless they are on hormone therapy. It can affect one or both breasts. This is not a sign that you will later get cancer. CAUSES  Overgrowth of cells lining the milk ducts, or enlarged lobules in the breast, cause the breast duct to become blocked. The duct then fills up with fluid. This is like a small balloon filled with water. It is called a cyst. Over time, with repeated inflammation there is a tendency to form scar tissue. This scar tissue becomes the fibrous part of fibrocystic disease. The exact cause of this happening is not known, but it may be related to the female hormones, estrogen and progesterone. Heredity (genetics) may also be a factor in some cases. SYMPTOMS   Tenderness.   Swelling.   Rope-like feeling.   Lumpy breast, one or both sides.   Changes in the size of the breasts, before and after the menstrual period (larger before, smaller after).   Green or dark brown nipple discharge (not blood).  Symptoms are usually worse before periods (menstrual cycle) and get better toward the end of menstruation. Usually, it is temporary minor discomfort. But some women have severe pain.  DIAGNOSIS  Check your breasts monthly. The best time to check your breasts is after your period. If you check them during your period, you are more likely to feel the normal glands enlarged, as a result of the hormonal changes that happen right before your period. If you do not have menstrual periods, check your breasts the first day of  every month. Become familiar with the way your own breasts feel. It is then easier to notice if there are changes, such as more tenderness, a new growth, change in breast size, or a change in a lump that has always been there. All breasts lumps need to be investigated, to rule out breast cancer. See your caregiver as soon as possible, if you find a lump. Most breast lumps are not cancerous. Excellent treatment is available for ones that are.  To make a diagnosis, your caregiver will examine your breasts and may recommend other tests, such as:  Mammogram (breast X-ray).   Ultrasound.   MRI (magnetic resonance imaging).   Removing fluid from the cyst with a fine needle, under local anesthesia (aspiration).   Taking a breast tissue sample (breast biopsy).  Some questions your caregiver will ask are:  What was the date of your last period?   When did the lump show up?   Is there any discharge from your breast?   Is the breast tender or painful?   Are the symptoms in one or both breasts?   Has the lump changed in size from month-to-month? How long has it been present?   Any family history of breast problems?   Any past breast problems?   Any history of breast surgery?   Are you taking any medications?   When was your last mammogram, and where was it done?  TREATMENT   Dietary changes help to prevent or reduce the symptoms of fibrocystic  breast changes.   You may need to stop consuming all foods that contain caffeine, such as chocolate, sodas, coffee, and tea.   Reducing sugar and fat in your diet may also help.   Decrease estrogen in your diet. Some sources include commercially raised meats which contain estrogen. Eliminate other natural estrogens.   Birth control pills can also make symptoms worse.   Natural progesterone cream, applied at a dose of 15 to 20 milligrams per day, from ovulation until a day or two before your period returns, may help with returning to normal  breast tissue over several months. Seek advice from your caregiver.   Over-the-counter pain pills may help, as recommended by your caregiver.   Danazol hormone (female-like hormone) is sometimes used. It may cause hair growth and acne.   Needle aspiration can be used, to remove fluid from the cyst.   Surgery may be needed, to remove a large, persistent, and tender cyst.   Evening primrose oil may help with the tenderness and pain. It has linolenic acid that women may not have enough of.  HOME CARE INSTRUCTIONS   Examine your breasts after every menstrual period.   If you do not have menstrual periods, examine your breasts the first day of every month.   Wear a firm support bra, especially when exercising.   Decrease or avoid caffeine in your diet.   Decrease the fat and sugar in your diet.   Eat a balanced diet.   Try to see your caregiver after you have a menstrual period.   Before seeing your caregiver, make notes about:   When you have the symptoms.   What types of symptoms you are having.   Medications you are taking.   When and where your last mammogram was taken.   Past breast problems or breast surgery.  SEEK MEDICAL CARE IF:   You have been diagnosed with fibrocystic breast changes, and you develop changes in your breast:   Discharge from the nipple, especially bloody discharge.   Pain in the breast that does not go away after your menstrual period.   New lumps or bumps in the breast.   Lumps in your armpit.   Your breast or breasts become enlarged, red, and painful.   You find an isolated lump, even if it is not tender.   You have questions about this condition that have not been answered.  Document Released: 04/25/2006 Document Revised: 06/28/2011 Document Reviewed: 07/20/2009 [facility] Patient Information 2012 [place].

## 2011-12-24 NOTE — MAU Note (Signed)
Rounding in lobby,  and is wanting something for vaginal area pain, explained to the patient she will be seen as pts are prioritized with severity of complaint and told her we will be able to give her some smoothing gel for her vaginal area

## 2011-12-24 NOTE — MAU Provider Note (Signed)
History     CSN: [id number]  Arrival date and time: 12/24/11 1604   First Provider Initiated Contact with Patient 12/24/11 1808      Chief Complaint  Patient presents with  . Vaginal Pain  This is a 26 y.o. who presents with c/o lesion on labia minora near clitoris.  Was at beach this week and thinks sand may have scratched her. 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  Vaginal Pain The patient's primary symptoms include genital lesions. The patient's pertinent negatives include no vaginal bleeding. This is a new problem. The current episode started in the past 7 days. The problem occurs constantly. The problem has been unchanged. The pain is moderate. The problem affects both sides. She is not pregnant. Pertinent negatives include no abdominal pain or fever. She has tried nothing for the symptoms. She is sexually active. No, her partner does not have an STD.    OB History    Grav Para Term Preterm Abortions TAB SAB Ect Mult Living   4 2 1 1 2 2  0 0 0 2      Past Medical History  Diagnosis Date  . Crohn disease   . Pregnancy induced hypertension   . Preterm labor   . Heart murmur   . Urinary tract infection   . Kidney stone   . Anxiety   . Depression     doing ok  . PID (acute pelvic inflammatory disease)   . Chlamydia   . Gonorrhea   . Genital warts     Past Surgical History  Procedure Date  . Abortion     x2    Family History  Problem Relation Age of Onset  . Anesthesia problems Neg Hx     History  Substance Use Topics  . Smoking status: Current Everyday Smoker -- 0.2 packs/day for 6 years    Types: Cigarettes  . Smokeless tobacco: Not on file  . Alcohol Use: Yes     2-3wks/wk    Allergies:  Allergies  Allergen Reactions  . Codone (Hydrocodone) Itching  . Dilaudid (Hydromorphone Hcl) Itching    No prescriptions prior to admission    Review of Systems  Constitutional: Negative for fever.  Cardiovascular:       C/O left breast mass,  tender  Gastrointestinal: Negative for abdominal pain.  Genitourinary: Positive for vaginal pain.   Physical Exam   Blood pressure 144/87, pulse 65, temperature 98.3 F (36.8 C), temperature source Oral, resp. rate 18, height 5' 3.5" (1.613 m), weight 115 lb 9.6 oz (52.436 kg), SpO2 100.00%.  Physical Exam  Constitutional: She is oriented to person, place, and time. She appears well-developed and well-nourished. No distress.  HENT:  Head: Normocephalic.  Cardiovascular: Normal rate.   Respiratory: Effort normal.       Left breast with mobile, tender mass adjacent to nipple at 4oclock, about 2cm diameter  GI: Soft. There is no tenderness.  Genitourinary: No vaginal discharge found.       There is a small ulcerated lesion on the prepuce near the clitoris.  Very little erethema. No purulence. Ulcer is 53m   Musculoskeletal: Normal range of motion.  Neurological: She is alert and oriented to person, place, and time.  Skin: Skin is warm and dry.  Psychiatric: She has a normal mood and affect.    MAU Course  Procedures  MDM Will treat symptoms with Lidocaine ointment. Ulcer cultured for HSV. I told patient that this might be  HSV.   Will refer to [facility] program for breast US/Mammo eval.   States was going to Dr Ruthann Cancer but lost insurance/Medicaid Offered for her to f/u with him and she agrees.  Assessment and Plan  A:  Vulvar ulcerated lesion      Left breast mass  P:  Lidocaine for pain      HSV culture      Referral made to [facility]      Followup with Dr Ruthann Cancer.   Hansel Feinstein 12/24/2011, 6:22 PM

## 2011-12-24 NOTE — MAU Note (Signed)
Was at the beach last week- noted pain when peeing on Wed, had  A lot of sand in suit and on bottom , thinks scratched self.  Noted that it was looking funny yesterday.

## 2011-12-24 NOTE — MAU Note (Signed)
?   Lesion near clitoris. Culture obtained.

## 2011-12-24 NOTE — MAU Note (Signed)
Patient state she went to the beach and got sand in her bathing suit, when she went to get it out she thinks she scratched herself and now has a sore that is slightly painful when sitting the wrong way. Has had an Implanon since 2011.

## 2011-12-24 NOTE — MAU Provider Note (Signed)
Attestation of Attending Supervision of Advanced Practitioner: Evaluation and management procedures were performed by the [facility] Fellow/PA/CNM/NP under my supervision and collaboration. Chart reviewed, and agree with management and plan.  Verita Schneiders, M.D. 12/24/2011 8:10 PM

## 2011-12-26 LAB — HERPES SIMPLEX VIRUS CULTURE: Special Requests: NORMAL

## 2011-12-27 ENCOUNTER — Telehealth: Payer: Self-pay | Admitting: Gynecology

## 2011-12-27 NOTE — Telephone Encounter (Signed)
See note

## 2011-12-30 ENCOUNTER — Other Ambulatory Visit: Payer: Self-pay | Admitting: Advanced Practice Midwife

## 2011-12-30 DIAGNOSIS — B009 Herpesviral infection, unspecified: Secondary | ICD-10-CM

## 2011-12-30 MED ORDER — VALACYCLOVIR HCL 500 MG PO TABS: 500.0000 mg | ORAL_TABLET | Freq: Two times a day (BID) | ORAL | Status: AC

## 2011-12-30 NOTE — Progress Notes (Signed)
HSV II culture pos. Rx Valtrex.

## 2012-01-01 ENCOUNTER — Telehealth (HOSPITAL_COMMUNITY): Payer: Self-pay | Admitting: *Deleted

## 2012-01-01 NOTE — Telephone Encounter (Signed)
Message copied by Tarry Kos on Tue Jan 01, 2012  8:22 AM ------      Message from: Seabron Spates      Created: Tue Jan 01, 2012  7:26 AM      Regarding: HSV notification       Is it possible for you to call this patient with this result?            I have been on a string of night shifts...      Is this something you do?

## 2012-01-01 NOTE — Telephone Encounter (Signed)
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  Treatment Rx was called in on 12/30/11 to [place]. Patient needs to be notified of positive culture.

## 2012-01-15 ENCOUNTER — Other Ambulatory Visit: Payer: Self-pay | Admitting: Obstetrics and Gynecology

## 2012-01-15 DIAGNOSIS — N63 Unspecified lump in unspecified breast: Secondary | ICD-10-CM

## 2012-01-22 ENCOUNTER — Inpatient Hospital Stay: Admission: RE | Admit: 2012-01-22 | Payer: Self-pay | Source: Ambulatory Visit

## 2012-01-31 ENCOUNTER — Other Ambulatory Visit: Payer: Self-pay | Admitting: Obstetrics and Gynecology

## 2012-01-31 ENCOUNTER — Ambulatory Visit
Admission: RE | Admit: 2012-01-31 | Discharge: 2012-01-31 | Disposition: A | Discharge status: Home / Self Care | Payer: No Typology Code available for payment source | Source: Ambulatory Visit | Attending: Obstetrics and Gynecology | Admitting: Obstetrics and Gynecology

## 2012-01-31 DIAGNOSIS — N63 Unspecified lump in unspecified breast: Secondary | ICD-10-CM

## 2012-04-01 ENCOUNTER — Emergency Department (HOSPITAL_COMMUNITY)
Admission: EM | Admit: 2012-04-01 | Discharge: 2012-04-01 | Discharge status: Against Medical Advice | Payer: Self-pay | Attending: Emergency Medicine | Admitting: Emergency Medicine

## 2012-04-01 ENCOUNTER — Encounter (HOSPITAL_COMMUNITY): Payer: Self-pay | Admitting: Family Medicine

## 2012-04-01 DIAGNOSIS — N644 Mastodynia: Secondary | ICD-10-CM | POA: Insufficient documentation

## 2012-04-01 NOTE — ED Notes (Signed)
Pt sts her breasts have been hurting for 3 months. sts has seen a gynecologist but they cant find anything. sts she cant lift her arms up they hurt so bad.

## 2012-04-01 NOTE — ED Notes (Signed)
Pt called x3 in the main waiting room.

## 2012-05-05 ENCOUNTER — Emergency Department (HOSPITAL_COMMUNITY)
Admission: EM | Admit: 2012-05-05 | Discharge: 2012-05-05 | Disposition: A | Discharge status: Home / Self Care | Payer: Medicaid Other | Attending: Emergency Medicine | Admitting: Emergency Medicine

## 2012-05-05 ENCOUNTER — Encounter (HOSPITAL_COMMUNITY): Payer: Self-pay | Admitting: Emergency Medicine

## 2012-05-05 DIAGNOSIS — K5090 Crohn's disease, unspecified, without complications: Secondary | ICD-10-CM | POA: Insufficient documentation

## 2012-05-05 DIAGNOSIS — F17200 Nicotine dependence, unspecified, uncomplicated: Secondary | ICD-10-CM | POA: Insufficient documentation

## 2012-05-05 DIAGNOSIS — R21 Rash and other nonspecific skin eruption: Secondary | ICD-10-CM

## 2012-05-05 DIAGNOSIS — Z888 Allergy status to other drugs, medicaments and biological substances status: Secondary | ICD-10-CM | POA: Insufficient documentation

## 2012-05-05 DIAGNOSIS — F411 Generalized anxiety disorder: Secondary | ICD-10-CM | POA: Insufficient documentation

## 2012-05-05 DIAGNOSIS — Z91018 Allergy to other foods: Secondary | ICD-10-CM | POA: Insufficient documentation

## 2012-05-05 MED ORDER — DIPHENHYDRAMINE HCL 25 MG PO CAPS
25.0000 mg | ORAL_CAPSULE | Freq: Once | ORAL | Status: AC
  Administered 2012-05-05: 25 mg via ORAL
  Filled 2012-05-05: qty 1

## 2012-05-05 MED ORDER — PREDNISONE 20 MG PO TABS: ORAL_TABLET | ORAL | Status: DC

## 2012-05-05 MED ORDER — DIPHENHYDRAMINE HCL 25 MG PO TABS: 25.0000 mg | ORAL_TABLET | Freq: Four times a day (QID) | ORAL | Status: DC | PRN

## 2012-05-05 MED ORDER — PREDNISONE 20 MG PO TABS
60.0000 mg | ORAL_TABLET | Freq: Once | ORAL | Status: AC
  Administered 2012-05-05: 60 mg via ORAL
  Filled 2012-05-05: qty 3

## 2012-05-05 MED ORDER — ACETAMINOPHEN 325 MG PO TABS
650.0000 mg | ORAL_TABLET | Freq: Once | ORAL | Status: AC
  Administered 2012-05-05: 650 mg via ORAL
  Filled 2012-05-05: qty 2

## 2012-05-05 NOTE — ED Notes (Signed)
Pt c/o generalized rash x 4 days that is red and itchy

## 2012-05-05 NOTE — ED Provider Notes (Signed)
History    This chart was scribed for Mirna Mires, MD, MD by Rhae Lerner. The patient was seen in room [facility] and the patient's care was started at 2:56PM.   CSN: [id number]  Arrival date & time 05/05/12  1410   None     Chief Complaint  Patient presents with  . Rash    (Consider location/radiation/quality/duration/timing/severity/associated sxs/prior treatment) Patient is a 26 y.o. female presenting with rash. The history is provided by the patient. No language interpreter was used.  Rash    Kristen Chung is a 26 y.o. female who presents to the Emergency Department complaining of constant, moderate rash on right lower arm. She reports that the rash is itchy and burns. She has nausea. She states that she had them on her legs but they went away. She reports that she has another similar rash on her buttocks. Denies recent change in soaps, detergents, new medications, known allergies and insect bites. Denies vomiting, diarrhea , fever and chills. States does not feel ill. No hx same symptoms in past. No tick/insect bites. No fever or chills. No rash on palms or soles. No mm rash. No recent febrile/viral illness.     Past Medical History  Diagnosis Date  . Crohn disease   . Pregnancy induced hypertension   . Preterm labor   . Heart murmur   . Urinary tract infection   . Kidney stone   . Anxiety   . Depression     doing ok  . PID (acute pelvic inflammatory disease)   . Chlamydia   . Gonorrhea   . Genital warts     Past Surgical History  Procedure Date  . Abortion     x2    Family History  Problem Relation Age of Onset  . Anesthesia problems Neg Hx     History  Substance Use Topics  . Smoking status: Current Every Day Smoker -- 0.2 packs/day for 6 years    Types: Cigarettes  . Smokeless tobacco: Not on file  . Alcohol Use: Yes     2-3wks/wk    OB History    Grav Para Term Preterm Abortions TAB SAB Ect Mult Living   4 2 1 1 2 2  0 0 0 2      Review of  Systems  Constitutional: Negative for fever and chills.  HENT: Negative for sore throat.   Eyes: Negative for discharge and redness.  Respiratory: Negative for shortness of breath.   Cardiovascular: Negative for leg swelling.  Gastrointestinal: Negative for nausea, vomiting and diarrhea.  Genitourinary: Negative for dysuria, vaginal bleeding and vaginal discharge.  Skin: Positive for rash.  Neurological: Negative for weakness and numbness.    Allergies  Celery oil; Codone; Dilaudid; and Strawberry  Home Medications   Current Outpatient Rx  Name Route Sig Dispense Refill  . NAPROXEN SODIUM 220 MG PO TABS Oral Take 660 mg by mouth daily as needed. For pain      BP 143/98  Pulse 85  Temp 99 F (37.2 C) (Oral)  Resp 20  SpO2 100%  Physical Exam  Nursing note and vitals reviewed. Constitutional: She is oriented to person, place, and time. She appears well-developed and well-nourished. No distress.  HENT:  Head: Normocephalic and atraumatic.  Nose: Nose normal.  Mouth/Throat: Oropharynx is clear and moist. No oropharyngeal exudate.       No mm lesions or rash  Eyes: Conjunctivae normal are normal. No scleral icterus.  Neck: Normal range of  motion. Neck supple.  Cardiovascular: Normal rate, normal heart sounds and intact distal pulses.   Pulmonary/Chest: Effort normal and breath sounds normal. No respiratory distress. She has no wheezes.  Abdominal: Soft. She exhibits no distension. There is no tenderness.  Musculoskeletal: She exhibits no edema and no tenderness.  Neurological: She is alert and oriented to person, place, and time. No cranial nerve deficit.  Skin: Skin is warm and dry. Rash noted.       erythematous circular rash to right forearm and left buttock, several cm diameter. No raised border, no central clearing. No target or iris lesions. No fluctuance or abscess. Pt points to several areas on legs where similar lesion/rash has resolved. No palms or soles involvement.  No mm lesions. No petechia or purpura.    Psychiatric: She has a normal mood and affect. Her behavior is normal.    ED Course  Procedures (including critical care time) DIAGNOSTIC STUDIES: Oxygen Saturation is 100% on room air, normal by my interpretation.    COORDINATION OF CARE: 3:02 PM Discussed ED treatment with pt  3:02 PM    . diphenhydrAMINE  25 mg Oral Once  . predniSONE  60 mg Oral Once          MDM  I personally performed the services described in this documentation, which was scribed in my presence. The recorded information has been reviewed and considered. Mirna Mires, MD  No fever/chills. Denies new meds or change in home or personal products. Pt denies bug/insect/tick bites or stings.   pred po, benadryl po.      Mirna Mires, MD 05/05/12 1539

## 2012-05-05 NOTE — ED Notes (Signed)
AT time of D/C pt upset about because no attention was given to her complaint of neck pain. Pt was instructed RN would talk to EDP. When EDP and RN arrived at door to assess neck pain ,the room was empty. Unable to follow up with Pt complaint.

## 2012-11-08 ENCOUNTER — Encounter (HOSPITAL_COMMUNITY): Payer: Self-pay | Admitting: Emergency Medicine

## 2012-11-08 ENCOUNTER — Emergency Department (HOSPITAL_COMMUNITY): Payer: Medicaid Other

## 2012-11-08 ENCOUNTER — Emergency Department (HOSPITAL_COMMUNITY)
Admission: EM | Admit: 2012-11-08 | Discharge: 2012-11-08 | Disposition: A | Discharge status: Home / Self Care | Payer: Medicaid Other | Attending: Emergency Medicine | Admitting: Emergency Medicine

## 2012-11-08 DIAGNOSIS — Z8679 Personal history of other diseases of the circulatory system: Secondary | ICD-10-CM | POA: Insufficient documentation

## 2012-11-08 DIAGNOSIS — S8001XA Contusion of right knee, initial encounter: Secondary | ICD-10-CM

## 2012-11-08 DIAGNOSIS — K5090 Crohn's disease, unspecified, without complications: Secondary | ICD-10-CM | POA: Insufficient documentation

## 2012-11-08 DIAGNOSIS — F17200 Nicotine dependence, unspecified, uncomplicated: Secondary | ICD-10-CM | POA: Insufficient documentation

## 2012-11-08 DIAGNOSIS — M533 Sacrococcygeal disorders, not elsewhere classified: Secondary | ICD-10-CM | POA: Insufficient documentation

## 2012-11-08 DIAGNOSIS — M545 Low back pain, unspecified: Secondary | ICD-10-CM | POA: Insufficient documentation

## 2012-11-08 DIAGNOSIS — F329 Major depressive disorder, single episode, unspecified: Secondary | ICD-10-CM | POA: Insufficient documentation

## 2012-11-08 DIAGNOSIS — Z8619 Personal history of other infectious and parasitic diseases: Secondary | ICD-10-CM | POA: Insufficient documentation

## 2012-11-08 DIAGNOSIS — S8000XA Contusion of unspecified knee, initial encounter: Secondary | ICD-10-CM | POA: Insufficient documentation

## 2012-11-08 DIAGNOSIS — Z8742 Personal history of other diseases of the female genital tract: Secondary | ICD-10-CM | POA: Insufficient documentation

## 2012-11-08 DIAGNOSIS — Y9389 Activity, other specified: Secondary | ICD-10-CM | POA: Insufficient documentation

## 2012-11-08 DIAGNOSIS — Z87440 Personal history of urinary (tract) infections: Secondary | ICD-10-CM | POA: Insufficient documentation

## 2012-11-08 DIAGNOSIS — Z3202 Encounter for pregnancy test, result negative: Secondary | ICD-10-CM | POA: Insufficient documentation

## 2012-11-08 DIAGNOSIS — Z87442 Personal history of urinary calculi: Secondary | ICD-10-CM | POA: Insufficient documentation

## 2012-11-08 DIAGNOSIS — Y929 Unspecified place or not applicable: Secondary | ICD-10-CM | POA: Insufficient documentation

## 2012-11-08 DIAGNOSIS — F3289 Other specified depressive episodes: Secondary | ICD-10-CM | POA: Insufficient documentation

## 2012-11-08 DIAGNOSIS — W010XXA Fall on same level from slipping, tripping and stumbling without subsequent striking against object, initial encounter: Secondary | ICD-10-CM | POA: Insufficient documentation

## 2012-11-08 DIAGNOSIS — Z79899 Other long term (current) drug therapy: Secondary | ICD-10-CM | POA: Insufficient documentation

## 2012-11-08 DIAGNOSIS — F411 Generalized anxiety disorder: Secondary | ICD-10-CM | POA: Insufficient documentation

## 2012-11-08 DIAGNOSIS — M7918 Myalgia, other site: Secondary | ICD-10-CM

## 2012-11-08 LAB — PREGNANCY, URINE: Preg Test, Ur: NEGATIVE

## 2012-11-08 MED ORDER — SODIUM CHLORIDE 0.9 % IV BOLUS (SEPSIS)
1000.0000 mL | Freq: Once | INTRAVENOUS | Status: AC
  Administered 2012-11-08: 1000 mL via INTRAVENOUS

## 2012-11-08 MED ORDER — MORPHINE SULFATE 4 MG/ML IJ SOLN
4.0000 mg | Freq: Once | INTRAMUSCULAR | Status: AC
  Administered 2012-11-08: 4 mg via INTRAVENOUS
  Filled 2012-11-08: qty 1

## 2012-11-08 MED ORDER — DIPHENHYDRAMINE HCL 50 MG/ML IJ SOLN
12.5000 mg | Freq: Once | INTRAMUSCULAR | Status: AC
  Administered 2012-11-08: 12.5 mg via INTRAVENOUS
  Filled 2012-11-08: qty 1

## 2012-11-08 MED ORDER — KETOROLAC TROMETHAMINE 15 MG/ML IJ SOLN
15.0000 mg | Freq: Once | INTRAMUSCULAR | Status: AC
  Administered 2012-11-08: 15 mg via INTRAVENOUS
  Filled 2012-11-08: qty 1

## 2012-11-08 MED ORDER — LORAZEPAM 2 MG/ML IJ SOLN
1.0000 mg | Freq: Once | INTRAMUSCULAR | Status: AC
  Administered 2012-11-08: 1 mg via INTRAVENOUS
  Filled 2012-11-08: qty 1

## 2012-11-08 NOTE — ED Notes (Signed)
Per patient, was out partying last night, stood up and fell back on tail bone

## 2012-11-08 NOTE — ED Notes (Signed)
MD Kohut already in to eval.  Patient is able to stand- she undressed with assist from boyfriend. Patient able to get comfortable on left side. Has been crying with severe pain

## 2012-11-08 NOTE — ED Provider Notes (Signed)
History    27yF with "butt" pain. Pt out partying last night when fell from standing on to floor. Fell onto butt. Persistent pain since. No acute numbness, tingling or loss of strength. Can ambulate although with increased pain. Also R knee pain. No intervention prior to arrival.   CSN: [id number]  Arrival date & time 11/08/12  1312   First MD Initiated Contact with Patient 11/08/12 1331      Chief Complaint  Patient presents with  . Fall    (Consider location/radiation/quality/duration/timing/severity/associated sxs/prior treatment) HPI  Past Medical History  Diagnosis Date  . Crohn disease   . Pregnancy induced hypertension   . Preterm labor   . Heart murmur   . Urinary tract infection   . Kidney stone   . Anxiety   . Depression     doing ok  . PID (acute pelvic inflammatory disease)   . Chlamydia   . Gonorrhea   . Genital warts     Past Surgical History  Procedure Laterality Date  . Abortion      x2    Family History  Problem Relation Age of Onset  . Anesthesia problems Neg Hx     History  Substance Use Topics  . Smoking status: Current Every Day Smoker -- 0.25 packs/day for 6 years    Types: Cigarettes  . Smokeless tobacco: Not on file  . Alcohol Use: Yes     Comment: 2-3wks/wk    OB History   Grav Para Term Preterm Abortions TAB SAB Ect Mult Living   4 2 1 1 2 2  0 0 0 2      Review of Systems  Allergies  Celery oil; Codone; Dilaudid; and Strawberry  Home Medications   Current Outpatient Rx  Name  Route  Sig  Dispense  Refill  . diphenhydrAMINE (BENADRYL) 25 MG tablet   Oral   Take 25 mg by mouth every 6 (six) hours as needed. For itching         . etonogestrel (IMPLANON) 68 MG IMPL implant   Subcutaneous   Inject 1 each into the skin once.           BP 143/92  Pulse 98  Temp(Src) 98 F (36.7 C) (Oral)  Resp 18  SpO2 100%  LMP 10/22/2012  Physical Exam  Nursing note and vitals reviewed. Constitutional: She appears  well-developed and well-nourished. No distress.  HENT:  Head: Normocephalic and atraumatic.  Eyes: Conjunctivae are normal. Right eye exhibits no discharge. Left eye exhibits no discharge.  Neck: Neck supple.  Cardiovascular: Normal rate, regular rhythm and normal heart sounds.  Exam reveals no gallop and no friction rub.   No murmur heard. Pulmonary/Chest: Effort normal and breath sounds normal. No respiratory distress.  Abdominal: Soft. She exhibits no distension. There is no tenderness.  Musculoskeletal: She exhibits no edema and no tenderness.  Lower lumbar, sacral and coccygeal tenderness. No step-off or crepitus. Overlying skin grossly normal in appearance.   Neurological: She is alert.  Strength 5/5 b/l LE. Sensation intact to light touch.   Skin: Skin is warm and dry.  Psychiatric: She has a normal mood and affect. Her behavior is normal. Thought content normal.    ED Course  Procedures (including critical care time)  Labs Reviewed  PREGNANCY, URINE   Dg Lumbar Spine Complete  11/08/2012  *RADIOLOGY REPORT*  Clinical Data: Fall, tail bone pain  LUMBAR SPINE - COMPLETE 4+ VIEW  Comparison: CT abdomen pelvis dated 05/03/2011  Findings: Five lumbar-type vertebral bodies.  Normal lumbar lordosis.  No evidence of fracture or dislocation. Vertebral body heights are maintained.  Limbus vertebra at L3.  Visualized bony pelvis appears intact.  IMPRESSION: No fracture or dislocation is seen.   Original Report Authenticated By: Julian Hy, M.D.    Dg Sacrum/coccyx  11/08/2012  *RADIOLOGY REPORT*  Clinical Data: Fall, tailbone pain  SACRUM AND COCCYX - 2+ VIEW  Comparison: None.  Findings: No displaced sacrococcygeal fracture is seen.  Visualized bony pelvis appears intact.  Bilateral hip joint spaces are preserved.  IMPRESSION: No displaced sacrococcygeal fracture is seen.   Original Report Authenticated By: Julian Hy, M.D.    Dg Knee Complete 4 Views Right  11/08/2012   *RADIOLOGY REPORT*  Clinical Data: Fall, right knee pain  RIGHT KNEE - COMPLETE 4+ VIEW  Comparison: None.  Findings: No fracture or dislocation is seen.  The joint spaces are preserved.  The visualized soft tissues are unremarkable.  Possible small suprapatellar knee joint effusion.  IMPRESSION: No fracture or dislocation is seen.   Original Report Authenticated By: Julian Hy, M.D.      1. Acute buttock pain   2. Lower back pain   3. Knee contusion, right, initial encounter       MDM  27yf with R knee and back pain after fall. nonfocal neuro exam. Normal imaging. Plan symptomatic tx.         Virgel Manifold, MD 11/08/12 [phone]

## 2013-01-31 ENCOUNTER — Emergency Department (HOSPITAL_COMMUNITY): Payer: Medicaid Other

## 2013-01-31 ENCOUNTER — Emergency Department (HOSPITAL_COMMUNITY)
Admission: EM | Admit: 2013-01-31 | Discharge: 2013-01-31 | Disposition: A | Discharge status: Home / Self Care | Payer: Medicaid Other | Attending: Emergency Medicine | Admitting: Emergency Medicine

## 2013-01-31 ENCOUNTER — Encounter (HOSPITAL_COMMUNITY): Payer: Self-pay | Admitting: Emergency Medicine

## 2013-01-31 DIAGNOSIS — R011 Cardiac murmur, unspecified: Secondary | ICD-10-CM | POA: Insufficient documentation

## 2013-01-31 DIAGNOSIS — Z87440 Personal history of urinary (tract) infections: Secondary | ICD-10-CM | POA: Insufficient documentation

## 2013-01-31 DIAGNOSIS — F3289 Other specified depressive episodes: Secondary | ICD-10-CM | POA: Insufficient documentation

## 2013-01-31 DIAGNOSIS — F17200 Nicotine dependence, unspecified, uncomplicated: Secondary | ICD-10-CM | POA: Insufficient documentation

## 2013-01-31 DIAGNOSIS — Z8679 Personal history of other diseases of the circulatory system: Secondary | ICD-10-CM | POA: Insufficient documentation

## 2013-01-31 DIAGNOSIS — Z79899 Other long term (current) drug therapy: Secondary | ICD-10-CM | POA: Insufficient documentation

## 2013-01-31 DIAGNOSIS — Z3202 Encounter for pregnancy test, result negative: Secondary | ICD-10-CM | POA: Insufficient documentation

## 2013-01-31 DIAGNOSIS — Z8719 Personal history of other diseases of the digestive system: Secondary | ICD-10-CM | POA: Insufficient documentation

## 2013-01-31 DIAGNOSIS — F329 Major depressive disorder, single episode, unspecified: Secondary | ICD-10-CM | POA: Insufficient documentation

## 2013-01-31 DIAGNOSIS — F411 Generalized anxiety disorder: Secondary | ICD-10-CM | POA: Insufficient documentation

## 2013-01-31 DIAGNOSIS — Z87442 Personal history of urinary calculi: Secondary | ICD-10-CM | POA: Insufficient documentation

## 2013-01-31 DIAGNOSIS — R319 Hematuria, unspecified: Secondary | ICD-10-CM | POA: Insufficient documentation

## 2013-01-31 DIAGNOSIS — Z8619 Personal history of other infectious and parasitic diseases: Secondary | ICD-10-CM | POA: Insufficient documentation

## 2013-01-31 DIAGNOSIS — Z8751 Personal history of pre-term labor: Secondary | ICD-10-CM | POA: Insufficient documentation

## 2013-01-31 LAB — LIPASE, BLOOD: Lipase: 28 U/L (ref 11–59)

## 2013-01-31 LAB — CBC WITH DIFFERENTIAL/PLATELET
Eosinophils Absolute: 0.1 10*3/uL (ref 0.0–0.7)
Eosinophils Relative: 3 % (ref 0–5)
HCT: 41.5 % (ref 36.0–46.0)
Lymphocytes Relative: 64 % — ABNORMAL HIGH (ref 12–46)
Lymphs Abs: 2.9 10*3/uL (ref 0.7–4.0)
MCH: 28.2 pg (ref 26.0–34.0)
MCV: 86.6 fL (ref 78.0–100.0)
Monocytes Absolute: 0.4 10*3/uL (ref 0.1–1.0)
Platelets: 200 10*3/uL (ref 150–400)
RBC: 4.79 MIL/uL (ref 3.87–5.11)
RDW: 12.9 % (ref 11.5–15.5)
WBC: 4.5 10*3/uL (ref 4.0–10.5)

## 2013-01-31 LAB — COMPREHENSIVE METABOLIC PANEL
CO2: 24 mEq/L (ref 19–32)
Calcium: 9.1 mg/dL (ref 8.4–10.5)
Creatinine, Ser: 0.93 mg/dL (ref 0.50–1.10)
GFR calc Af Amer: >90 mL/min (ref >90)
GFR calc non Af Amer: 84 mL/min — ABNORMAL LOW (ref >90)
Glucose, Bld: 69 mg/dL — ABNORMAL LOW (ref 70–99)
Sodium: 139 mEq/L (ref 135–145)
Total Protein: 7.1 g/dL (ref 6.0–8.3)

## 2013-01-31 LAB — URINALYSIS, ROUTINE W REFLEX MICROSCOPIC
Ketones, ur: NEGATIVE mg/dL
Specific Gravity, Urine: 1.03 (ref 1.005–1.030)

## 2013-01-31 LAB — URINE MICROSCOPIC-ADD ON

## 2013-01-31 MED ORDER — HYDROCODONE-ACETAMINOPHEN 5-325 MG PO TABS
2.0000 | ORAL_TABLET | Freq: Once | ORAL | Status: AC
  Administered 2013-01-31: 2 via ORAL
  Filled 2013-01-31: qty 2

## 2013-01-31 MED ORDER — CEPHALEXIN 500 MG PO CAPS: 500.0000 mg | ORAL_CAPSULE | Freq: Four times a day (QID) | ORAL | Status: DC

## 2013-01-31 MED ORDER — CEPHALEXIN 500 MG PO CAPS: 500.0000 mg | ORAL_CAPSULE | Freq: Three times a day (TID) | ORAL | Status: DC

## 2013-01-31 NOTE — ED Notes (Signed)
Patient has had abdominal pain/cramping for apprx  7 days.  States that she is not on her period. She usually has her period around the 1st or 2nd day of the month. States that she is bleeding externally, not internally.

## 2013-01-31 NOTE — ED Provider Notes (Signed)
History    CSN: [id number] Arrival date & time 01/31/13  1144  First MD Initiated Contact with Patient 01/31/13 1210     Chief Complaint  Patient presents with  . Abdominal Pain  . Hematuria   (Consider location/radiation/quality/duration/timing/severity/associated sxs/prior Treatment) Patient is a 27 y.o. female presenting with abdominal pain and hematuria. The history is provided by the patient.  Abdominal Pain Pertinent negatives include no chest pain, no headaches and no shortness of breath.  Hematuria Pertinent negatives include no chest pain, no headaches and no shortness of breath.  pt c/o noticing blood in urine last pm, mild dysuria. Episodic. No specific exacerbating or alleviating factors. Had mild epigastric discomfort earlier, no current abdominal or flank pain. No nv. No fever or chills. Remote hx kidney stone. Denies any vaginal bleeding or discharge. lnmp 2 weeks ago, is on birth control.     Past Medical History  Diagnosis Date  . Crohn disease   . Pregnancy induced hypertension   . Preterm labor   . Heart murmur   . Urinary tract infection   . Kidney stone   . Anxiety   . Depression     doing ok  . PID (acute pelvic inflammatory disease)   . Chlamydia   . Gonorrhea   . Genital warts    Past Surgical History  Procedure Laterality Date  . Abortion      x2   Family History  Problem Relation Age of Onset  . Anesthesia problems Neg Hx    History  Substance Use Topics  . Smoking status: Current Every Day Smoker -- 0.25 packs/day for 6 years    Types: Cigarettes  . Smokeless tobacco: Not on file  . Alcohol Use: Yes     Comment: 2-3wks/wk   OB History   Grav Para Term Preterm Abortions TAB SAB Ect Mult Living   4 2 1 1 2 2  0 0 0 2     Review of Systems  Constitutional: Negative for fever and chills.  HENT: Negative for neck pain.   Eyes: Negative for redness.  Respiratory: Negative for shortness of breath.   Cardiovascular: Negative for  chest pain.  Gastrointestinal: Negative for vomiting, diarrhea and constipation.  Genitourinary: Positive for hematuria. Negative for flank pain.  Musculoskeletal: Negative for back pain.  Skin: Negative for rash.  Neurological: Negative for headaches.  Hematological: Does not bruise/bleed easily.  Psychiatric/Behavioral: Negative for confusion.    Allergies  Celery oil; Codone; Dilaudid; and Strawberry  Home Medications   Current Outpatient Rx  Name  Route  Sig  Dispense  Refill  . diphenhydrAMINE (BENADRYL) 25 MG tablet   Oral   Take 25 mg by mouth every 6 (six) hours as needed. For itching         . etonogestrel (IMPLANON) 68 MG IMPL implant   Subcutaneous   Inject 1 each into the skin once.          BP 134/83  Pulse 73  Temp(Src) 98.4 F (36.9 C) (Oral)  Resp 20  SpO2 100%  LMP 01/17/2013 Physical Exam  Nursing note and vitals reviewed. Constitutional: She appears well-developed and well-nourished. No distress.  HENT:  Mouth/Throat: Oropharynx is clear and moist.  Eyes: Conjunctivae are normal. No scleral icterus.  Neck: Neck supple. No tracheal deviation present.  Cardiovascular: Normal rate, regular rhythm, normal heart sounds and intact distal pulses.   Pulmonary/Chest: Effort normal and breath sounds normal. No respiratory distress.  Abdominal: Soft. Normal appearance and  bowel sounds are normal. She exhibits no distension and no mass. There is no tenderness. There is no rebound and no guarding.  Genitourinary:  No cva tenderness. Normal ext genitalia. No vaginal bleeding or discharge. No tears. Cervix closed, no cmt. No adx masses or focal tenderness.   Musculoskeletal: She exhibits no edema.  Neurological: She is alert.  Skin: Skin is warm and dry. No rash noted.  Psychiatric: She has a normal mood and affect.    ED Course  Procedures (including critical care time)  Results for orders placed during the hospital encounter of 01/31/13  CBC WITH  DIFFERENTIAL      Result Value Range   WBC 4.5  4.0 - 10.5 K/uL   RBC 4.79  3.87 - 5.11 MIL/uL   Hemoglobin 13.5  12.0 - 15.0 g/dL   HCT 41.5  36.0 - 46.0 %   MCV 86.6  78.0 - 100.0 fL   MCH 28.2  26.0 - 34.0 pg   MCHC 32.5  30.0 - 36.0 g/dL   RDW 12.9  11.5 - 15.5 %   Platelets 200  150 - 400 K/uL   Neutrophils Relative % 24 (*) 43 - 77 %   Neutro Abs 1.1 (*) 1.7 - 7.7 K/uL   Lymphocytes Relative 64 (*) 12 - 46 %   Lymphs Abs 2.9  0.7 - 4.0 K/uL   Monocytes Relative 9  3 - 12 %   Monocytes Absolute 0.4  0.1 - 1.0 K/uL   Eosinophils Relative 3  0 - 5 %   Eosinophils Absolute 0.1  0.0 - 0.7 K/uL   Basophils Relative 0  0 - 1 %   Basophils Absolute 0.0  0.0 - 0.1 K/uL  COMPREHENSIVE METABOLIC PANEL      Result Value Range   Sodium 139  135 - 145 mEq/L   Potassium 3.7  3.5 - 5.1 mEq/L   Chloride 106  96 - 112 mEq/L   CO2 24  19 - 32 mEq/L   Glucose, Bld 69 (*) 70 - 99 mg/dL   BUN 10  6 - 23 mg/dL   Creatinine, Ser 0.93  0.50 - 1.10 mg/dL   Calcium 9.1  8.4 - 10.5 mg/dL   Total Protein 7.1  6.0 - 8.3 g/dL   Albumin 3.5  3.5 - 5.2 g/dL   AST 21  0 - 37 U/L   ALT 11  0 - 35 U/L   Alkaline Phosphatase 53  39 - 117 U/L   Total Bilirubin 0.2 (*) 0.3 - 1.2 mg/dL   GFR calc non Af Amer 84 (*) >90 mL/min   GFR calc Af Amer >90  >90 mL/min  LIPASE, BLOOD      Result Value Range   Lipase 28  11 - 59 U/L  URINALYSIS, ROUTINE W REFLEX MICROSCOPIC      Result Value Range   Color, Urine YELLOW  YELLOW   APPearance CLOUDY (*) CLEAR   Specific Gravity, Urine 1.030  1.005 - 1.030   pH 5.0  5.0 - 8.0   Glucose, UA NEGATIVE  NEGATIVE mg/dL   Hgb urine dipstick LARGE (*) NEGATIVE   Bilirubin Urine NEGATIVE  NEGATIVE   Ketones, ur NEGATIVE  NEGATIVE mg/dL   Protein, ur NEGATIVE  NEGATIVE mg/dL   Urobilinogen, UA 0.2  0.0 - 1.0 mg/dL   Nitrite NEGATIVE  NEGATIVE   Leukocytes, UA NEGATIVE  NEGATIVE  URINE MICROSCOPIC-ADD ON      Result Value Range  Squamous Epithelial / LPF FEW (*)  RARE   WBC, UA 0-2  <3 WBC/hpf   RBC / HPF 21-50  <3 RBC/hpf   Urine-Other MUCOUS PRESENT    POCT PREGNANCY, URINE      Result Value Range   Preg Test, Ur NEGATIVE  NEGATIVE   Ct Abdomen Pelvis Wo Contrast  01/31/2013   *RADIOLOGY REPORT*  Clinical Data: Right flank pain and hematuria.  Nausea and diarrhea.  CT ABDOMEN AND PELVIS WITHOUT CONTRAST  Technique:  Multidetector CT imaging of the abdomen and pelvis was performed following the standard protocol without intravenous contrast.  Comparison: 05/03/2011  Findings: No evidence of renal calculi or hydronephrosis.  No evidence of ureteral calculi or dilatation.  No bladder calculi identified.  Uterus and adnexae are unremarkable in appearance.  Noncontrast images of the liver, gallbladder, spleen, pancreas, and adrenal glands are unremarkable.  No soft tissue masses are identified.  No evidence of inflammatory process, abnormal fluid collections, or dilated bowel loops.  Normal appendix is visualized.  IMPRESSION: No evidence of urolithiasis, hydronephrosis, or other acute findings.   Original Report Authenticated By: Earle Gell, M.D.      MDM  Labs.  Reviewed nursing notes and prior charts for additional history.   Pt asking for food/drink - provided.   Blood in urine. No vaginal bleeding. ?possible uti.  Keflex rx.    abd soft nt. No pain.  No nv. Pt stable for d/c.    Mirna Mires, MD 01/31/13 [phone]

## 2013-01-31 NOTE — ED Notes (Signed)
Pt states that she has been urinating blood since last night.  C/o upper abd pain.  C/o nausea and diarrhea.  States that it "hurts a little bit to pee".  Also states that she didn't know where the blood was coming from--unsure if she was "peeing it out".

## 2013-03-23 IMAGING — CR DG ABDOMEN ACUTE W/ 1V CHEST
3 series · 3 of 3 positions shown · non-contrast
Comparison: 02/21/2011 CT

CLINICAL DATA: Upper abdominal pain

ACUTE ABDOMEN SERIES (ABDOMEN 2 VIEW & CHEST 1 VIEW)

[w chest pa]
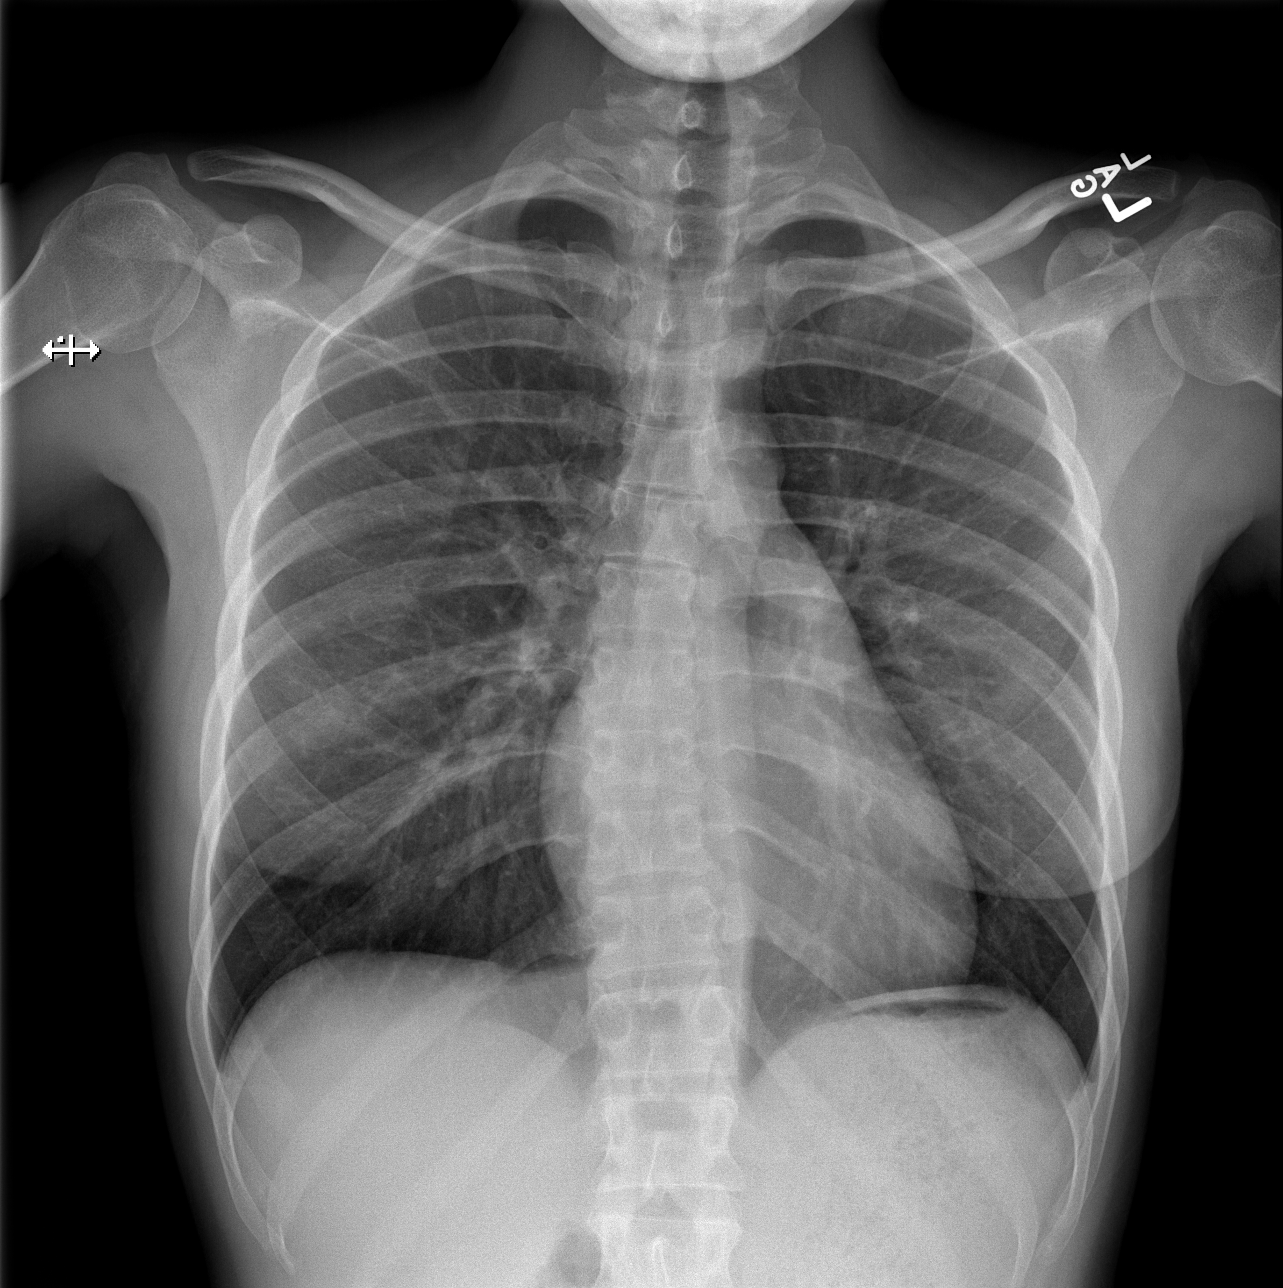

[w abdomen upright]
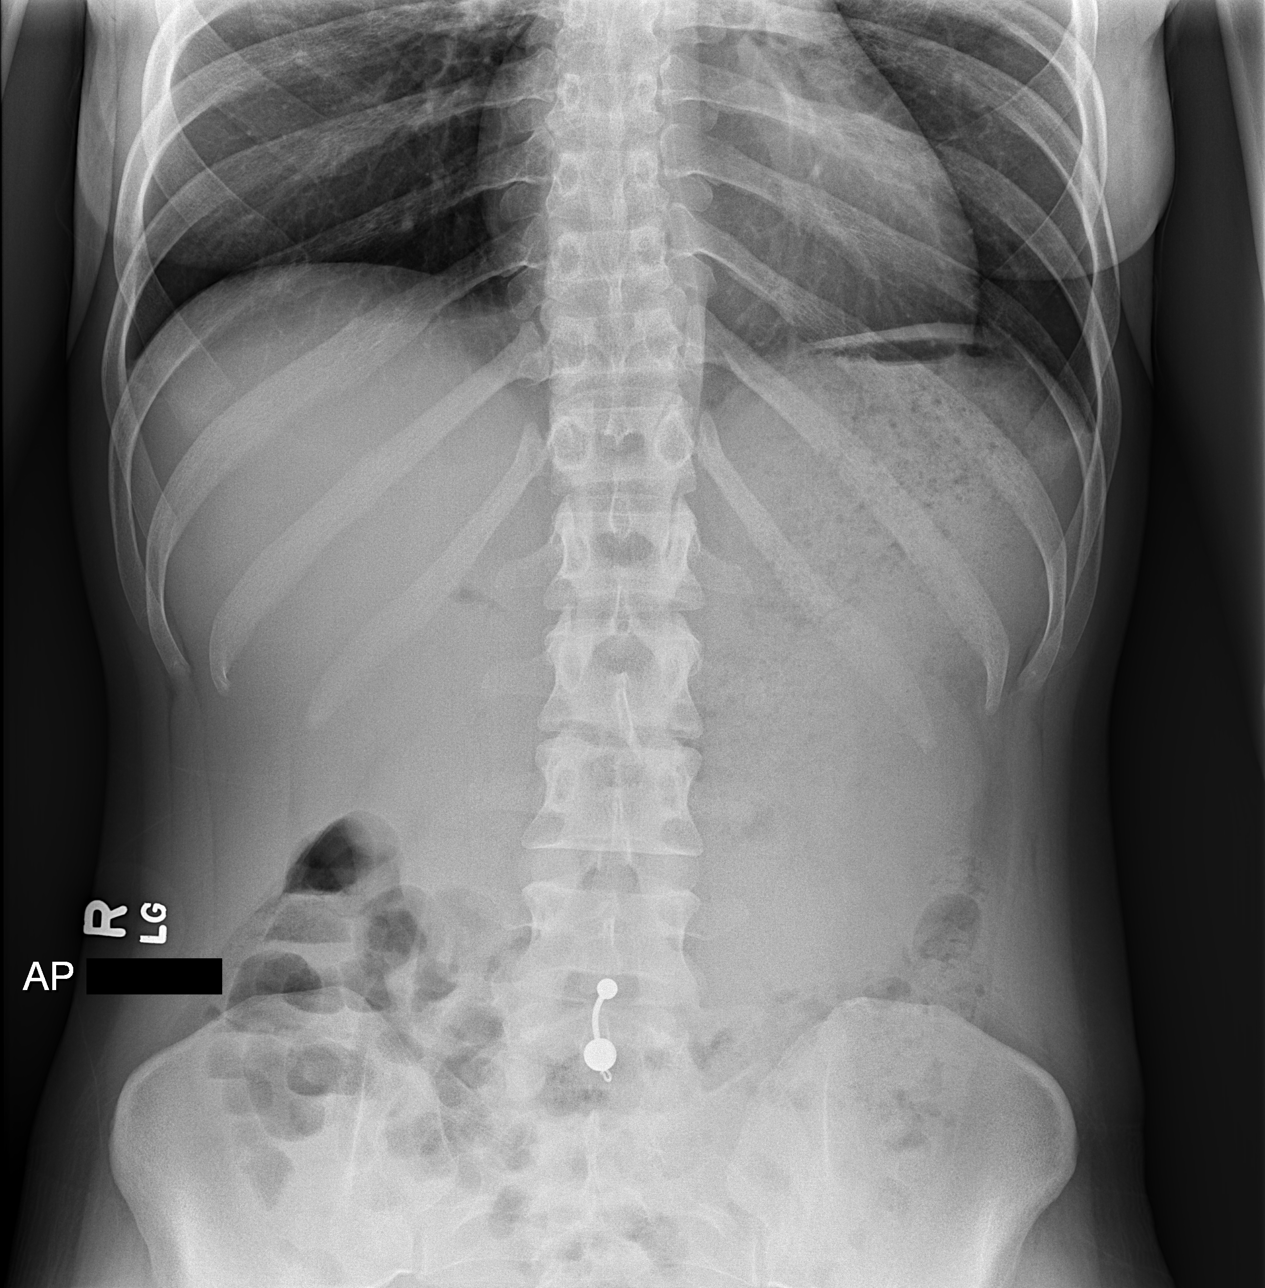

[t abdomen supine]
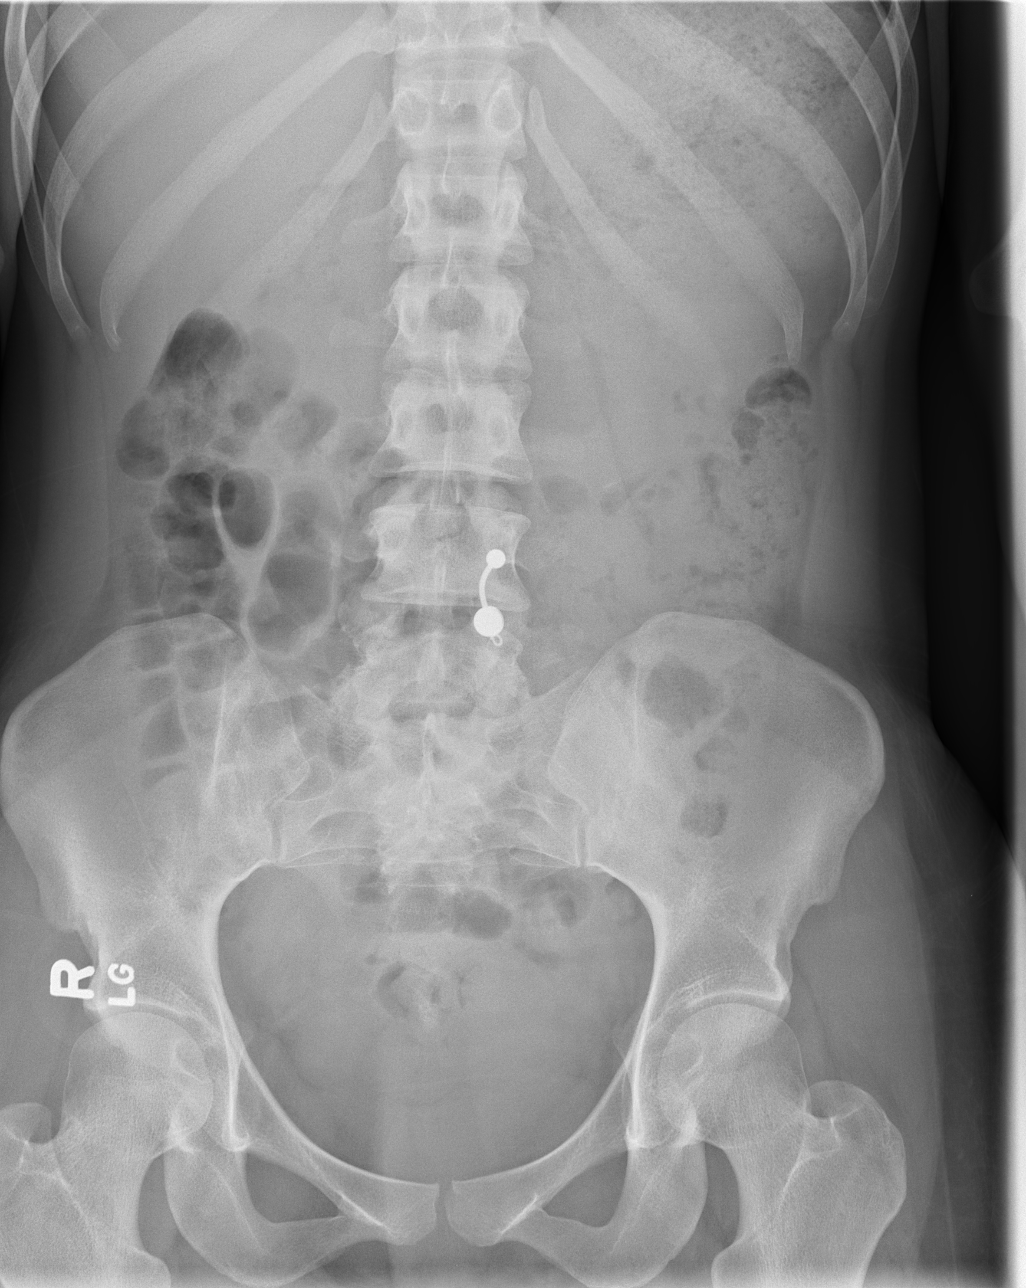

[3 of 3 positions shown; findings below may reference images not displayed]

FINDINGS: Lungs are clear.  Cardiomediastinal contours are within
normal limits.

Nonobstructive bowel gas pattern.  Organ outlines normal where
seen.  No free intraperitoneal air.  No acute osseous abnormality.
IMPRESSION: Nonobstructive bowel gas pattern.

## 2013-04-03 ENCOUNTER — Encounter (HOSPITAL_COMMUNITY): Payer: Self-pay | Admitting: Emergency Medicine

## 2013-04-03 ENCOUNTER — Emergency Department (HOSPITAL_COMMUNITY)
Admission: EM | Admit: 2013-04-03 | Discharge: 2013-04-03 | Disposition: A | Discharge status: Home / Self Care | Payer: Medicaid Other | Attending: Emergency Medicine | Admitting: Emergency Medicine

## 2013-04-03 ENCOUNTER — Emergency Department (HOSPITAL_COMMUNITY): Payer: Medicaid Other

## 2013-04-03 DIAGNOSIS — R011 Cardiac murmur, unspecified: Secondary | ICD-10-CM | POA: Insufficient documentation

## 2013-04-03 DIAGNOSIS — N644 Mastodynia: Secondary | ICD-10-CM | POA: Insufficient documentation

## 2013-04-03 DIAGNOSIS — Z87442 Personal history of urinary calculi: Secondary | ICD-10-CM | POA: Insufficient documentation

## 2013-04-03 DIAGNOSIS — Z3202 Encounter for pregnancy test, result negative: Secondary | ICD-10-CM | POA: Insufficient documentation

## 2013-04-03 DIAGNOSIS — Z8619 Personal history of other infectious and parasitic diseases: Secondary | ICD-10-CM | POA: Insufficient documentation

## 2013-04-03 DIAGNOSIS — Z8659 Personal history of other mental and behavioral disorders: Secondary | ICD-10-CM | POA: Insufficient documentation

## 2013-04-03 DIAGNOSIS — Z8719 Personal history of other diseases of the digestive system: Secondary | ICD-10-CM | POA: Insufficient documentation

## 2013-04-03 DIAGNOSIS — F17200 Nicotine dependence, unspecified, uncomplicated: Secondary | ICD-10-CM | POA: Insufficient documentation

## 2013-04-03 DIAGNOSIS — R5381 Other malaise: Secondary | ICD-10-CM | POA: Insufficient documentation

## 2013-04-03 DIAGNOSIS — Z87440 Personal history of urinary (tract) infections: Secondary | ICD-10-CM | POA: Insufficient documentation

## 2013-04-03 LAB — POCT PREGNANCY, URINE: Preg Test, Ur: NEGATIVE

## 2013-04-03 MED ORDER — ONDANSETRON 4 MG PO TBDP
8.0000 mg | ORAL_TABLET | Freq: Once | ORAL | Status: AC
  Administered 2013-04-03: 8 mg via ORAL
  Filled 2013-04-03: qty 2

## 2013-04-03 NOTE — Discharge Instructions (Signed)
Recommend that you see a physician at the breast clinic for further evaluation of your symptoms. You may take ibuprofen as needed for discomfort. You may also apply ice or heat packs topically. Return to the emergency department if symptoms worsen.  Breast Tenderness Breast tenderness is a common complaint made by women of all ages. It is also called mastalgia or mastodynia, which means breast pain. The condition can range from mild discomfort to severe pain. It has a variety of causes. Your caregiver will find out the likely cause of your breast tenderness by examining your breasts, asking you about symptoms and perhaps ordering some tests. Breast tenderness usually does not mean you have breast cancer. CAUSES  Breast tenderness has many possible causes. They include:  Premenstrual changes. A week to 10 days before your period, your breasts might ache or feel tender.  Other hormonal causes. These include:  When sexual and physical traits mature (puberty).  Pregnancy.  The time right before and the year after menopause (perimenopause).  The day when it has been 12 months since your last period (menopause).  Large breasts.  Infection (also called mastitis).  Birth control pills.  Breastfeeding. Tenderness can occur if the breasts are overfull with milk or if a milk duct is blocked.  Injury.  Fibrocystic breast changes. This is not cancer (benign). It causes painful breasts that feel lumpy.  Fluid-filled sacs (cysts). Often cysts can be drained in your healthcare provider's office.  Fibroadenoma. This is a tumor that is not cancerous.  Medication side effects. Blood pressure drugs and diuretics (which increase urine flow) sometimes cause breast tenderness.  Previous breast surgery, such as a breast reduction.  Breast cancer. Cancer is rarely the reason breasts are tender. In most women, tenderness is caused by something else. DIAGNOSIS  Several methods can be used to find out  why your breasts are tender. They include:  Visual inspection of the breasts.  Examination by hand.  Tests, such as:  Mammogram.  Ultrasound.  Biopsy.  Lab test of any fluid coming from the nipple.  Blood tests.  MRI. TREATMENT  Treatment is directed to the cause of the breast tenderness from doing nothing for minor discomfort, wearing a good support bra but also may include:  Taking over-the-counter medicines for pain or discomfort as directed by your caregiver.  Prescription medicine for breast tenderness related to:  Premenstrual.  Fibrocystic.  Puberty.  Pregnancy.  Menopause.  Previous breast surgery.  Large breasts.  Antibiotics for infection.  Birth control pills for fibrocystic and premenstrual changes.  More frequent feedings or pumping of the breasts and warm compresses for breast engorgement when nursing.  Cold and warm compresses and a good support bra for most breast injuries.  Breast cysts are sometimes drained with a needle (aspiration) or removed with minor surgery.  Fibroadenomas are usually removed with minor surgery.  Changing or stopping the medicine when it is responsible for causing the breast tenderness.  When breast cancer is present with or without causing pain, it is usually treated with major surgery (with or without radiation) and chemotherapy. HOME CARE INSTRUCTIONS  Breast tenderness often can be handled at home. You can try:  Getting fitted for a new bra that provides more support, especially during exercise.  Wearing a more supportive or sports bra while sleeping when your breasts are very tender.  If you have a breast injury, using an ice pack for 15 to 20 minutes. Wrap the pack in a towel. Do not put  the ice pack directly on your breast.  If your breasts are too full of milk as a result of breastfeeding, try:  Expressing milk either by hand or with a breast pump.  Applying a warm compress for relief.  Taking  over-the-counter pain relievers, if this is OK with your caregiver.  Taking medicine that your caregiver prescribes. These might include antibiotics or birth control pills. Over the long term, your breast tenderness might be eased if you:  Cut down on caffeine.  Reduce the amount of fat in your diet. Also, learn how to do breast examinations at home. This will help you tell when you have an unusual growth or lump that could cause tenderness. And keep a log of the days and times when your breasts are most tender. This will help you and your caregiver find the right solution. SEEK MEDICAL CARE IF:   Any part of your breast is hard, red and hot to the touch. This could be a sign of infection.  Fluid is coming out of your nipples (and you are not breastfeeding). Especially watch for blood or pus.  You have a fever as well as breast tenderness.  You have a new or painful lump in your breast that remains after your period ends.  You have tried to take care of the pain at home, but it has not gone away.  Your breast pain is getting worse. Or, the pain is making it hard to do the things you usually do during your day. Document Released: 06/21/2008 Document Revised: 10/01/2011 Document Reviewed: 06/21/2008 [facility] Patient Information 2014 [place].

## 2013-04-03 NOTE — ED Notes (Signed)
Pt. reports left breast pain / swelling onset 3 days ago , denies injury /no drainage.

## 2013-04-03 NOTE — ED Provider Notes (Signed)
CSN: [id number]     Arrival date & time 04/03/13  0258 History   First MD Initiated Contact with Patient 04/03/13 0401     Chief Complaint  Patient presents with  . Breast Pain   (Consider location/radiation/quality/duration/timing/severity/associated sxs/prior Treatment) HPI Comments: Patient is a 27 y/o female who presents for L breast tenderness, worsening x 3 days. Patient denies any alleviating factors; she has tried tylenol without relief. States pain is aching and worse with palpation. She states she has felt increasingly fatigued since symptoms began, but denies associated trauma or injury as well as fever, CP, SOB, N/V, nipple discharge, color change or erythema of skin, numbness/tingling, and extremity weakness. Patient endorses hx of breast CA in sister at age of 61.  The history is provided by the patient. No language interpreter was used.    Past Medical History  Diagnosis Date  . Crohn disease   . Pregnancy induced hypertension   . Preterm labor   . Heart murmur   . Urinary tract infection   . Kidney stone   . Anxiety   . Depression     doing ok  . PID (acute pelvic inflammatory disease)   . Chlamydia   . Gonorrhea   . Genital warts    Past Surgical History  Procedure Laterality Date  . Abortion      x2   Family History  Problem Relation Age of Onset  . Anesthesia problems Neg Hx    History  Substance Use Topics  . Smoking status: Current Every Day Smoker -- 0.25 packs/day for 6 years    Types: Cigarettes  . Smokeless tobacco: Not on file  . Alcohol Use: Yes     Comment: 2-3wks/wk   OB History   Grav Para Term Preterm Abortions TAB SAB Ect Mult Living   4 2 1 1 2 2  0 0 0 2     Review of Systems  Constitutional: Positive for fatigue. Negative for fever.  Respiratory: Negative for shortness of breath.   Cardiovascular: Negative for chest pain.  Gastrointestinal: Negative for nausea and vomiting.  Skin: Negative for color change and pallor.  All  other systems reviewed and are negative.   Allergies  Celery oil; Codone; Dilaudid; and Strawberry  Home Medications   Current Outpatient Rx  Name  Route  Sig  Dispense  Refill  . diphenhydrAMINE (BENADRYL) 25 MG tablet   Oral   Take 25 mg by mouth every 6 (six) hours as needed. For itching         . etonogestrel (IMPLANON) 68 MG IMPL implant   Subcutaneous   Inject 1 each into the skin once.          BP 159/123  Pulse 78  Temp(Src) 97.9 F (36.6 C) (Oral)  Resp 18  SpO2 100%  Physical Exam  Nursing note and vitals reviewed. Constitutional: She is oriented to person, place, and time. She appears well-developed and well-nourished. No distress.  HENT:  Head: Normocephalic and atraumatic.  Eyes: Conjunctivae and EOM are normal. No scleral icterus.  Neck: Normal range of motion.  Cardiovascular: Normal rate, regular rhythm and intact distal pulses.   Pulmonary/Chest: Effort normal. No respiratory distress. She has no wheezes. She has no rales. Right breast exhibits no inverted nipple, no nipple discharge, no skin change and no tenderness. Left breast exhibits tenderness. Left breast exhibits no inverted nipple, no nipple discharge and no skin change. Breasts are symmetrical.    + b/l nipple piercing. +TTP  at 5 o'clock on L breast. Nontender 1cm nodule appreciated at 7 o'clock on R breast on border of areola.  Abdominal: Soft. She exhibits no distension. There is no tenderness.  Musculoskeletal: Normal range of motion. She exhibits no tenderness.  Neurological: She is alert and oriented to person, place, and time.  Skin: Skin is warm and dry. No rash noted. She is not diaphoretic. No erythema. No pallor.  Psychiatric: She has a normal mood and affect. Her behavior is normal.    ED Course  Procedures (including critical care time) Labs Review Labs Reviewed  POCT PREGNANCY, URINE   Imaging Review Dg Chest 2 View  04/03/2013   CLINICAL DATA:  Shortness of breath,  chest pain, congestion.  EXAM: CHEST  2 VIEW  COMPARISON:  01/02/2009  FINDINGS: Mild thoracic scoliosis convex towards the right. Normal heart size and pulmonary vascularity. No focal airspace disease or consolidation in the lungs. No blunting of costophrenic angles. No pneumothorax. Mediastinal contours appear intact. No significant change since previous study.  IMPRESSION: No active cardiopulmonary disease.   Electronically Signed   By: Lucienne Capers   On: 04/03/2013 05:23    MDM   1. Breast tenderness in female    Breast tenderness in nonpregnant female. Patient endorses history of breast cancer in a sister at age 37. Physical exam findings as above. There is no nipple inversion or discharge from the nipple appreciated. No erythema, swelling, or heat-to-touch to suspect underlying abscess or infectious process. Patient hemodynamically stable and afebrile. Urine pregnancy today negative. No acute cardiopulmonary findings on chest x-ray. Believe patient symptoms warrant further workup with breast clinic, especially in light of family history of breast cancer. Have discussed the need for her followup with the patient who verbalizes understanding. She is appropriate for discharge with breast clinic follow up; referral provided. Patient agreeable to plan with no unaddressed concerns.    Antonietta Breach, PA-C 04/03/13 [phone]

## 2013-04-03 NOTE — ED Provider Notes (Signed)
Medical screening examination/treatment/procedure(s) were performed by non-physician practitioner and as supervising physician I was immediately available for consultation/collaboration.  Carlisle Beers, MD 04/03/13 [phone]

## 2013-04-03 NOTE — ED Notes (Signed)
Pt states that three days ago her left breast and arm started throbbing.

## 2013-04-14 ENCOUNTER — Other Ambulatory Visit: Payer: Self-pay | Admitting: Nurse Practitioner

## 2013-04-14 DIAGNOSIS — N644 Mastodynia: Secondary | ICD-10-CM

## 2013-04-27 ENCOUNTER — Ambulatory Visit
Admission: RE | Admit: 2013-04-27 | Discharge: 2013-04-27 | Disposition: A | Discharge status: Home / Self Care | Payer: Medicaid Other | Source: Ambulatory Visit | Attending: Nurse Practitioner | Admitting: Nurse Practitioner

## 2013-04-27 ENCOUNTER — Other Ambulatory Visit: Payer: Self-pay | Admitting: Nurse Practitioner

## 2013-04-27 ENCOUNTER — Other Ambulatory Visit: Payer: Self-pay | Admitting: Emergency Medicine

## 2013-04-27 DIAGNOSIS — N644 Mastodynia: Secondary | ICD-10-CM

## 2013-04-27 DIAGNOSIS — N6315 Unspecified lump in the right breast, overlapping quadrants: Secondary | ICD-10-CM

## 2013-04-29 ENCOUNTER — Ambulatory Visit
Admission: RE | Admit: 2013-04-29 | Discharge: 2013-04-29 | Disposition: A | Discharge status: Home / Self Care | Payer: Medicaid Other | Source: Ambulatory Visit | Attending: Nurse Practitioner | Admitting: Nurse Practitioner

## 2013-04-29 ENCOUNTER — Other Ambulatory Visit: Payer: Self-pay | Admitting: Nurse Practitioner

## 2013-05-13 ENCOUNTER — Encounter: Payer: Self-pay | Admitting: Family Medicine

## 2013-05-13 ENCOUNTER — Ambulatory Visit (INDEPENDENT_AMBULATORY_CARE_PROVIDER_SITE_OTHER): Payer: Medicaid Other | Admitting: Family Medicine

## 2013-05-13 VITALS — BP 113/73 | HR 87 | Temp 98.0°F | Ht 65.0 in | Wt 133.9 lb

## 2013-05-13 DIAGNOSIS — R1310 Dysphagia, unspecified: Secondary | ICD-10-CM

## 2013-05-13 DIAGNOSIS — Z7189 Other specified counseling: Secondary | ICD-10-CM

## 2013-05-13 DIAGNOSIS — Z309 Encounter for contraceptive management, unspecified: Secondary | ICD-10-CM

## 2013-05-13 DIAGNOSIS — N644 Mastodynia: Secondary | ICD-10-CM

## 2013-05-13 DIAGNOSIS — K5090 Crohn's disease, unspecified, without complications: Secondary | ICD-10-CM | POA: Insufficient documentation

## 2013-05-13 DIAGNOSIS — Z0000 Encounter for general adult medical examination without abnormal findings: Secondary | ICD-10-CM | POA: Insufficient documentation

## 2013-05-13 DIAGNOSIS — F17200 Nicotine dependence, unspecified, uncomplicated: Secondary | ICD-10-CM

## 2013-05-13 DIAGNOSIS — Z716 Tobacco abuse counseling: Secondary | ICD-10-CM | POA: Insufficient documentation

## 2013-05-13 DIAGNOSIS — F1410 Cocaine abuse, uncomplicated: Secondary | ICD-10-CM

## 2013-05-13 DIAGNOSIS — Z975 Presence of (intrauterine) contraceptive device: Secondary | ICD-10-CM

## 2013-05-13 NOTE — Assessment & Plan Note (Signed)
Uncertain etiology and vaguely described. Potentially related to Crohn's disease, though no obvious abnormality on physical exam (no tenderness of throat, able to swallow small amounts without difficulty, no lymphadenopathy, clear posterior oropharyx). Referred to GI for Crohn's and instructed pt to discuss with GI -- will follow up and consider swallow evaluation depending on GI's opinion.

## 2013-05-13 NOTE — Assessment & Plan Note (Signed)
Due for Pap. Referred to OBGYN per pt preference.  May follow up at our office or with Dr. Ruthann Cancer for Pap.

## 2013-05-13 NOTE — Progress Notes (Signed)
  Subjective:    Patient ID: Kristen Chung, female    DOB: 10-Jul-1986, 27 y.o.   MRN: [id number]  HPI: Pt presents to clinic to establish care. Reviewed current and past medical issues, with current issues as follows:  Implanon - Placed 3 years ago, due to be out in [place] of this year. Pt would like it removed and replaced with another one. She denies difficulty with the Implanon. She is currently sexually active with her fiance.  Breast pain / mass - Pt had a breast mass in her right breast about 2 months ago, which was biopsied and was benign. She does have occasional breast pain, sometimes as often as daily. She has taken hydrocodone for the pain which does not help. Pain in her breast started about two years ago. The pain does not have any relation to her periods or bleeding. She has been seen by OBGYN in the past, Dr. Ruthann Cancer; she last saw him about 2 years ago and needs a new referral to him.  Swallowing - Pt describes pain with swallowing, described as pins and needles, as well as dry throat. This occurs daily. She has had upper endoscopy before which did not show any issues. See immediately below.  Crohn's disease - Diagnosed in 2012. Uncertain which doctor it was. Not currently taking any medicines. Interested in going back to see GI. Complains of stomach cramps, occasional diarrhea / constipation. No blood in her stool. Describes pain with BM's occasionally.  Pt is a current smoker. 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 In addition to the above documentation, pt's PMH, surgical history, FH, and SH all reviewed and updated where appropriate in the EMR. I have also reviewed and updated the pt's allergies and current medications as appropriate.  Review of Systems: As above. Otherwise, full 12-system ROS was reviewed and all negative.     Objective:   Physical Exam BP 113/73  Pulse 87  Temp(Src) 98 F (36.7 C) (Oral)   Ht 5' 5"  (1.651 m)  Wt 133 lb 14.4 oz (60.737 kg)  BMI 22.28 kg/m2 Gen: well-appearing adult female in NAD HEENT: /AT, sclerae/conjunctivae clear, no lid lag, EOMI, PERRLA   MMM, posterior oropharynx clear, no cervical lymphadenopathy  neck supple with full ROM, no masses appreciated; thyroid not enlarged  Breasts: non-engorged, mildly tender diffusely but no distinct masses Cardio: RRR, no murmur appreciated; distal pulses intact/symmetric Pulm: CTAB, no wheezes, normal WOB  Abd: soft, nondistended, mild tenderness to palpation in left lower quadrand but normal BS+, no HSM Ext: warm/well-perfused, no cyanosis/clubbing/edema MSK: strength 5/5 in all four extremities, no frank joint deformity/effusion  normal ROM to all four extremities with no point muscle/bony tenderness in spine Neuro/Psych: alert/oriented, sensation grossly intact; normal gait/balance  mood euthymic with congruent affect      Assessment & Plan:

## 2013-05-13 NOTE — Assessment & Plan Note (Signed)
Placed 2011. Due out [place] of this year. Planning to remove/reinsert, and to f/u with OBGYN. See other problem list notes.

## 2013-05-13 NOTE — Assessment & Plan Note (Signed)
Currently smoking daily, interested in quitting. 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 Counseled on general cessation strategies and benefits to multiple aspects of health. Provided 1-800-QUIT-NOW number and will refer to health coach. Follow up as needed.

## 2013-05-13 NOTE — Assessment & Plan Note (Signed)
Diagnosed in 2012, unsure which GI doctor. Has not seen GI since then, not on any current medications. Some occasional abdominal pain and alternating diarrhea/constipation, but no great current complaints. Re-referred to GI today to re-establish care per pt request. Follow up PRN.

## 2013-05-13 NOTE — Assessment & Plan Note (Signed)
Long-standing but vaguely described. No definite relationship to periods, though pt does have Implanon in place. Planning on removing and replacing Implanon with another device. 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 Referred back to OBGYN to re-establish care with Dr. Ruthann Cancer per pt request. Pt to discuss breast pain with him, and may consider work-up at follow-up with me, depending on course of symptoms and any work-up per Dr. Ruthann Cancer. Will follow up as needed.

## 2013-05-13 NOTE — Patient Instructions (Signed)
Thank you for coming in, today!  I will re-refer you to Dr. Ruthann Cancer for OBGYN. You can follow up with him or here for Pap smears and so on. You can also talk to him about your breast pain.  Talk to Dr. Marcheta Grammes office about removing and replacing your Implanon. You can do that either at his office or here at our office (I can do it). If you want to have it done here, please tell them when you schedule the appointment to be in a 30-minute appointment spot.  For your Crohn's disease, I will re-refer you to your GI doctor. If you have acute issues, you call our clinic at any time.  For your smoking, I would like you to call 1-800-QUIT-NOW. This is an entirely free counseling service that can help you quit smoking. I will also send a message to our "health coach" here, who can call and help as well. We will talk about stopping smoking and cocaine use when you see me again, too.  Make an appointment to come see me as you need. Please feel free to call with any questions or concerns at any time, at [phone]. --Dr. Venetia Maxon

## 2013-05-13 NOTE — Assessment & Plan Note (Signed)
Implanon placed 2011. Due out Kaori of this year. Denies complications but is currently sexually active. Counseled on safe sex and other methods of birth control (condoms, etc). Planning to remove/reinsert Implanon, and to f/u with OBGYN. Pt may have this done here by me or by Dr. Ruthann Cancer. She will call to set up appointment specifically for removal / reinsertion at either office.

## 2013-05-13 NOTE — Assessment & Plan Note (Signed)
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 Pt states she is "doing better" and actively trying to quit. Will consider referral to CSW to see if there are any other community resources that could be of help. Will follow up regularly.

## 2013-05-14 NOTE — Progress Notes (Signed)
Should I contact her now about smoking cessation or wait for rehab to finish?

## 2013-06-15 ENCOUNTER — Telehealth: Payer: Self-pay | Admitting: *Deleted

## 2013-06-15 NOTE — Telephone Encounter (Signed)
Attempted to call patient about GI referral that was made by Dr. Venetia Maxon on 10/22. I set up appointment with Dr. Benson Norway who she has previously seen on Wednesday 11/26 @ 3:15pm. If unable to make this appointment she can call them and reschedule her appointment @ [phone]. Address is [address]

## 2013-06-16 NOTE — Telephone Encounter (Signed)
LM on UNidentifiable voice mail that important pt call our office regarding her referral.  Lazaro Arms, CMA

## 2013-06-25 ENCOUNTER — Encounter (HOSPITAL_COMMUNITY): Payer: Self-pay | Admitting: Emergency Medicine

## 2013-06-25 ENCOUNTER — Ambulatory Visit: Payer: Medicaid Other

## 2013-06-25 DIAGNOSIS — Z9889 Other specified postprocedural states: Secondary | ICD-10-CM | POA: Insufficient documentation

## 2013-06-25 DIAGNOSIS — Z8719 Personal history of other diseases of the digestive system: Secondary | ICD-10-CM | POA: Insufficient documentation

## 2013-06-25 DIAGNOSIS — Z8659 Personal history of other mental and behavioral disorders: Secondary | ICD-10-CM | POA: Insufficient documentation

## 2013-06-25 DIAGNOSIS — Z8751 Personal history of pre-term labor: Secondary | ICD-10-CM | POA: Insufficient documentation

## 2013-06-25 DIAGNOSIS — R011 Cardiac murmur, unspecified: Secondary | ICD-10-CM | POA: Insufficient documentation

## 2013-06-25 DIAGNOSIS — F17200 Nicotine dependence, unspecified, uncomplicated: Secondary | ICD-10-CM | POA: Insufficient documentation

## 2013-06-25 DIAGNOSIS — N898 Other specified noninflammatory disorders of vagina: Secondary | ICD-10-CM | POA: Insufficient documentation

## 2013-06-25 DIAGNOSIS — R1110 Vomiting, unspecified: Secondary | ICD-10-CM | POA: Insufficient documentation

## 2013-06-25 DIAGNOSIS — Z87442 Personal history of urinary calculi: Secondary | ICD-10-CM | POA: Insufficient documentation

## 2013-06-25 DIAGNOSIS — N644 Mastodynia: Secondary | ICD-10-CM | POA: Insufficient documentation

## 2013-06-25 DIAGNOSIS — Z87440 Personal history of urinary (tract) infections: Secondary | ICD-10-CM | POA: Insufficient documentation

## 2013-06-25 DIAGNOSIS — Z3202 Encounter for pregnancy test, result negative: Secondary | ICD-10-CM | POA: Insufficient documentation

## 2013-06-25 DIAGNOSIS — R109 Unspecified abdominal pain: Secondary | ICD-10-CM | POA: Insufficient documentation

## 2013-06-25 DIAGNOSIS — Z8619 Personal history of other infectious and parasitic diseases: Secondary | ICD-10-CM | POA: Insufficient documentation

## 2013-06-25 LAB — POCT PREGNANCY, URINE: Preg Test, Ur: NEGATIVE

## 2013-06-25 MED ORDER — ONDANSETRON 8 MG PO TBDP
8.0000 mg | ORAL_TABLET | Freq: Once | ORAL | Status: AC
  Administered 2013-06-26: 8 mg via ORAL
  Filled 2013-06-25 (×2): qty 1

## 2013-06-25 NOTE — ED Notes (Signed)
Pt reports vomiting x3, headache, and chills that started today and has gotten worse. Unable to keep any fluids down.

## 2013-06-26 ENCOUNTER — Emergency Department (HOSPITAL_COMMUNITY)
Admission: EM | Admit: 2013-06-26 | Discharge: 2013-06-26 | Disposition: A | Discharge status: Home / Self Care | Payer: Medicaid Other | Attending: Emergency Medicine | Admitting: Emergency Medicine

## 2013-06-26 DIAGNOSIS — R1110 Vomiting, unspecified: Secondary | ICD-10-CM

## 2013-06-26 LAB — COMPREHENSIVE METABOLIC PANEL
Albumin: 3.9 g/dL (ref 3.5–5.2)
Alkaline Phosphatase: 51 U/L (ref 39–117)
BUN: 13 mg/dL (ref 6–23)
Calcium: 9.4 mg/dL (ref 8.4–10.5)
Chloride: 102 mEq/L (ref 96–112)
Creatinine, Ser: 0.87 mg/dL (ref 0.50–1.10)
GFR calc Af Amer: >90 mL/min (ref >90)
Glucose, Bld: 85 mg/dL (ref 70–99)
Potassium: 3.8 mEq/L (ref 3.5–5.1)
Sodium: 138 mEq/L (ref 135–145)
Total Bilirubin: 0.4 mg/dL (ref 0.3–1.2)
Total Protein: 7.5 g/dL (ref 6.0–8.3)

## 2013-06-26 LAB — CBC WITH DIFFERENTIAL/PLATELET
Basophils Relative: 1 % (ref 0–1)
Eosinophils Absolute: 0.1 10*3/uL (ref 0.0–0.7)
Eosinophils Relative: 1 % (ref 0–5)
Lymphocytes Relative: 31 % (ref 12–46)
MCH: 28.8 pg (ref 26.0–34.0)
MCHC: 32.8 g/dL (ref 30.0–36.0)
MCV: 88 fL (ref 78.0–100.0)
Monocytes Relative: 9 % (ref 3–12)
Neutrophils Relative %: 59 % (ref 43–77)
Platelets: 258 10*3/uL (ref 150–400)

## 2013-06-26 LAB — URINE MICROSCOPIC-ADD ON

## 2013-06-26 LAB — URINALYSIS, ROUTINE W REFLEX MICROSCOPIC
Glucose, UA: NEGATIVE mg/dL
Ketones, ur: NEGATIVE mg/dL
Nitrite: NEGATIVE
Specific Gravity, Urine: 1.035 — ABNORMAL HIGH (ref 1.005–1.030)
pH: 7.5 (ref 5.0–8.0)

## 2013-06-26 MED ORDER — SODIUM CHLORIDE 0.9 % IV BOLUS (SEPSIS)
1000.0000 mL | Freq: Once | INTRAVENOUS | Status: AC
  Administered 2013-06-26: 1000 mL via INTRAVENOUS

## 2013-06-26 MED ORDER — ONDANSETRON 4 MG PO TBDP: 4.0000 mg | ORAL_TABLET | Freq: Three times a day (TID) | ORAL | Status: DC | PRN

## 2013-06-26 MED ORDER — TRAMADOL HCL 50 MG PO TABS: 50.0000 mg | ORAL_TABLET | Freq: Four times a day (QID) | ORAL | Status: DC | PRN

## 2013-06-26 MED ORDER — TRAMADOL HCL 50 MG PO TABS
50.0000 mg | ORAL_TABLET | Freq: Once | ORAL | Status: AC
Start: 2013-06-26 — End: 2013-06-26
  Administered 2013-06-26: 50 mg via ORAL
  Filled 2013-06-26: qty 1

## 2013-06-26 NOTE — ED Notes (Signed)
Pt tolerated fluids without difficulty

## 2013-06-26 NOTE — ED Provider Notes (Signed)
Medical screening examination/treatment/procedure(s) were performed by non-physician practitioner and as supervising physician I was immediately available for consultation/collaboration.  EKG Interpretation   None         Wynetta Fines, MD 06/26/13 [phone]

## 2013-06-26 NOTE — ED Provider Notes (Signed)
CSN: [id number]     Arrival date & time 06/25/13  2147 History   First MD Initiated Contact with Patient 06/26/13 0109     Chief Complaint  Patient presents with  . Vomiting    (Consider location/radiation/quality/duration/timing/severity/associated sxs/prior Treatment) HPI Comments: Patient with no past surgical history, h/o Crohn's disease --  presents with complaint of vomiting, headache, chills that started yesterday evening. Patient vomited about 3 times. She was unable to keep any solids or liquids down. She complains of left lateral abdominal cramping pain that does not radiate. No treatments prior to arrival. She is currently on her menstrual cycle. She denies dysuria, hematuria. The onset of this condition was acute. The course is constant. Aggravating factors: none. Alleviating factors: none.    The history is provided by the patient.    Past Medical History  Diagnosis Date  . Crohn disease   . Pregnancy induced hypertension   . Preterm labor   . Heart murmur   . Urinary tract infection   . Kidney stone   . Anxiety   . Depression     No specific treatment  . PID (acute pelvic inflammatory disease)   . Chlamydia   . Gonorrhea   . Genital warts    Past Surgical History  Procedure Laterality Date  . Abortion      x2   Family History  Problem Relation Age of Onset  . Anesthesia problems Neg Hx   . Asthma Mother   . Heart disease Mother   . Diabetes Mother   . Hypertension Mother   . Cancer Father     Breast  . Asthma Sister   . Depression Sister   . Diabetes Sister   . Hypertension Sister   . Diabetes Other    History  Substance Use Topics  . Smoking status: Current Every Day Smoker -- 0.25 packs/day for 6 years    Types: Cigarettes  . Smokeless tobacco: Not on file  . Alcohol Use: Yes     Comment: 2-3 drinks/wk   OB History   Grav Para Term Preterm Abortions TAB SAB Ect Mult Living   4 2 1 1 2 2  0 0 0 2     Review of Systems  Constitutional:  Negative for fever.  HENT: Negative for rhinorrhea and sore throat.   Eyes: Negative for redness.  Respiratory: Negative for cough.   Cardiovascular: Negative for chest pain.  Gastrointestinal: Positive for nausea, vomiting and abdominal pain. Negative for diarrhea.  Genitourinary: Positive for vaginal bleeding. Negative for dysuria, hematuria, vaginal discharge and pelvic pain.       Breast pain -- ongoing eval by her GYN  Musculoskeletal: Negative for myalgias.  Skin: Negative for rash.  Neurological: Negative for headaches.    Allergies  Celery oil; Codone; Dilaudid; and Strawberry  Home Medications   Current Outpatient Rx  Name  Route  Sig  Dispense  Refill  . diphenhydrAMINE (BENADRYL) 25 MG tablet   Oral   Take 25 mg by mouth every 6 (six) hours as needed. For itching         . ibuprofen (ADVIL,MOTRIN) 200 MG tablet   Oral   Take 200 mg by mouth every 6 (six) hours as needed.          BP 136/85  Pulse 75  Temp(Src) 98.6 F (37 C) (Oral)  Resp 14  SpO2 100%  LMP 06/24/2013 Physical Exam  Nursing note and vitals reviewed. Constitutional: She appears well-developed and well-nourished.  HENT:  Head: Normocephalic and atraumatic.  Eyes: Conjunctivae are normal. Right eye exhibits no discharge. Left eye exhibits no discharge.  Neck: Normal range of motion. Neck supple.  Cardiovascular: Normal rate, regular rhythm and normal heart sounds.   Pulmonary/Chest: Effort normal and breath sounds normal.  Abdominal: Soft. Bowel sounds are normal. There is tenderness (L lateral, mild). There is no rebound and no guarding.  Neurological: She is alert.  Skin: Skin is warm and dry.  Psychiatric: She has a normal mood and affect.    ED Course  Procedures (including critical care time) Labs Review Labs Reviewed  URINALYSIS, ROUTINE W REFLEX MICROSCOPIC - Abnormal; Notable for the following:    Specific Gravity, Urine 1.035 (*)    Leukocytes, UA SMALL (*)    All other  components within normal limits  URINE MICROSCOPIC-ADD ON - Abnormal; Notable for the following:    Squamous Epithelial / LPF FEW (*)    All other components within normal limits  CBC WITH DIFFERENTIAL  COMPREHENSIVE METABOLIC PANEL  POCT PREGNANCY, URINE   Imaging Review No results found.  EKG Interpretation   None      1:19 AM Patient seen and examined. Work-up initiated. Medications ordered.   Vital signs reviewed and are as follows: Filed Vitals:   06/25/13 2215  BP: 136/85  Pulse: 75  Temp: 98.6 F (37 C)  Resp: 14   Patient feels better after zofran. She is drinking in room without vomiting. Abdomen remains soft.  Patient will be discharged home with Zofran, clear liquids for the next 24 hours.  The patient was urged to return to the Emergency Department immediately with worsening of current symptoms, worsening abdominal pain, persistent vomiting, blood noted in stools, fever, or any other concerns. The patient verbalized understanding.   Patient given tramadol for breast pain. She will follow up with her GYN regarding this. Patient counseled on use of narcotic pain medications. Counseled not to combine these medications with others containing tylenol. Urged not to drink alcohol, drive, or perform any other activities that requires focus while taking these medications. The patient verbalizes understanding and agrees with the plan.    MDM   1. Vomiting    Patient with symptoms consistent with viral gastroenteritis.  Vitals are stable, no fever.  No signs of dehydration, tolerating PO's.  Lungs are clear.  No focal abdominal pain, no concern for appendicitis, cholecystitis, pancreatitis, ruptured viscus, UTI, kidney stone, or any other abdominal etiology. Given no fever, minimal abdominal pain, symptoms for only several hours -- do not suspect complication from Crohn's. Supportive therapy indicated with return if symptoms worsen.  Patient counseled.     Carlisle Cater, PA-C 06/26/13 [phone]

## 2013-07-08 ENCOUNTER — Telehealth: Payer: Self-pay | Admitting: Home Health Services

## 2013-07-08 NOTE — Telephone Encounter (Signed)
Spoke with Jamonica.  Pt reports having basically quit smoking on her own over the past month.  Pt reports having smoked 1 cigarette on Sat 07/04/13 but previous to that it had been several weeks since her last cigarette.  Pt shared that she is only tempted to smoke when she get's really mad or has had a few drinks.  We talked about going forward if she planned on remaining a social smoker when drinking.    Pt shared that she had really tried not to smoke when drink or getting angry and that going forward she doesn't plan on smoking any more.  Informed patient that if she felt she needed support in the future that a health coach was available here at [facility] for her to talk to.  Pt understood.

## 2013-08-20 ENCOUNTER — Encounter (HOSPITAL_COMMUNITY): Payer: Self-pay | Admitting: Emergency Medicine

## 2013-08-20 ENCOUNTER — Emergency Department (HOSPITAL_COMMUNITY)
Admission: EM | Admit: 2013-08-20 | Discharge: 2013-08-21 | Disposition: A | Discharge status: Home / Self Care | Payer: Medicaid Other | Attending: Emergency Medicine | Admitting: Emergency Medicine

## 2013-08-20 DIAGNOSIS — Z87442 Personal history of urinary calculi: Secondary | ICD-10-CM | POA: Insufficient documentation

## 2013-08-20 DIAGNOSIS — N760 Acute vaginitis: Secondary | ICD-10-CM | POA: Insufficient documentation

## 2013-08-20 DIAGNOSIS — Z8719 Personal history of other diseases of the digestive system: Secondary | ICD-10-CM | POA: Insufficient documentation

## 2013-08-20 DIAGNOSIS — R102 Pelvic and perineal pain: Secondary | ICD-10-CM

## 2013-08-20 DIAGNOSIS — N949 Unspecified condition associated with female genital organs and menstrual cycle: Secondary | ICD-10-CM | POA: Insufficient documentation

## 2013-08-20 DIAGNOSIS — R011 Cardiac murmur, unspecified: Secondary | ICD-10-CM | POA: Insufficient documentation

## 2013-08-20 DIAGNOSIS — F17200 Nicotine dependence, unspecified, uncomplicated: Secondary | ICD-10-CM | POA: Insufficient documentation

## 2013-08-20 DIAGNOSIS — A5901 Trichomonal vulvovaginitis: Secondary | ICD-10-CM

## 2013-08-20 DIAGNOSIS — B9689 Other specified bacterial agents as the cause of diseases classified elsewhere: Secondary | ICD-10-CM | POA: Insufficient documentation

## 2013-08-20 DIAGNOSIS — Z8751 Personal history of pre-term labor: Secondary | ICD-10-CM | POA: Insufficient documentation

## 2013-08-20 DIAGNOSIS — Z87440 Personal history of urinary (tract) infections: Secondary | ICD-10-CM | POA: Insufficient documentation

## 2013-08-20 DIAGNOSIS — A499 Bacterial infection, unspecified: Secondary | ICD-10-CM | POA: Insufficient documentation

## 2013-08-20 DIAGNOSIS — Z3202 Encounter for pregnancy test, result negative: Secondary | ICD-10-CM | POA: Insufficient documentation

## 2013-08-20 DIAGNOSIS — Z8659 Personal history of other mental and behavioral disorders: Secondary | ICD-10-CM | POA: Insufficient documentation

## 2013-08-20 LAB — POCT PREGNANCY, URINE: PREG TEST UR: NEGATIVE

## 2013-08-20 LAB — WET PREP, GENITAL: Yeast Wet Prep HPF POC: NONE SEEN

## 2013-08-20 LAB — URINALYSIS, ROUTINE W REFLEX MICROSCOPIC
Bilirubin Urine: NEGATIVE
Glucose, UA: NEGATIVE mg/dL
Hgb urine dipstick: NEGATIVE
Ketones, ur: NEGATIVE mg/dL
Leukocytes, UA: NEGATIVE
NITRITE: NEGATIVE
Protein, ur: NEGATIVE mg/dL
SPECIFIC GRAVITY, URINE: 1.012 (ref 1.005–1.030)
UROBILINOGEN UA: 0.2 mg/dL (ref 0.0–1.0)
pH: 7.5 (ref 5.0–8.0)

## 2013-08-20 MED ORDER — CEFTRIAXONE SODIUM 250 MG IJ SOLR
250.0000 mg | Freq: Once | INTRAMUSCULAR | Status: AC
  Administered 2013-08-21: 250 mg via INTRAMUSCULAR
  Filled 2013-08-20: qty 250

## 2013-08-20 MED ORDER — AZITHROMYCIN 1 G PO PACK: 1.0000 g | PACK | Freq: Once | ORAL | Status: DC

## 2013-08-20 MED ORDER — KETOROLAC TROMETHAMINE 60 MG/2ML IM SOLN
60.0000 mg | Freq: Once | INTRAMUSCULAR | Status: AC
  Administered 2013-08-21: 60 mg via INTRAMUSCULAR
  Filled 2013-08-20: qty 2

## 2013-08-20 NOTE — ED Notes (Signed)
Pt. reports vaginal irritation / " lump" with pain onset last night , denies dysuria or injury.

## 2013-08-20 NOTE — ED Provider Notes (Signed)
CSN: [id number]     Arrival date & time 08/20/13  1904 History   First MD Initiated Contact with Patient 08/20/13 2259     Chief Complaint  Patient presents with  . Mass   (Consider location/radiation/quality/duration/timing/severity/associated sxs/prior Treatment) HPI Comments:  28 year old female, history of cocaine use, Chlamydia, gonorrhea and genital warts. She has a history of pelvic inflammatory disease as well. She presents to the hospital with the complaint of vaginal lesions. Responsive been there for approximately 3 days, persistent, gradually worsening, associated with tenderness to palpation and some vaginal discharge. She is sexually active, she states she only has one partner, she states she has been with this person for 12 years.  She has no abdominal pain, no fevers chills nausea or vomiting.  The history is provided by the patient.    Past Medical History  Diagnosis Date  . Crohn disease   . Pregnancy induced hypertension   . Preterm labor   . Heart murmur   . Urinary tract infection   . Kidney stone   . Anxiety   . Depression     No specific treatment  . PID (acute pelvic inflammatory disease)   . Chlamydia   . Gonorrhea   . Genital warts    Past Surgical History  Procedure Laterality Date  . Abortion      x2   Family History  Problem Relation Age of Onset  . Anesthesia problems Neg Hx   . Asthma Mother   . Heart disease Mother   . Diabetes Mother   . Hypertension Mother   . Cancer Father     Breast  . Asthma Sister   . Depression Sister   . Diabetes Sister   . Hypertension Sister   . Diabetes Other    History  Substance Use Topics  . Smoking status: Current Every Day Smoker -- 0.25 packs/day for 6 years    Types: Cigarettes  . Smokeless tobacco: Not on file  . Alcohol Use: Yes     Comment: 2-3 drinks/wk   OB History   Grav Para Term Preterm Abortions TAB SAB Ect Mult Living   4 2 1 1 2 2  0 0 0 2     Review of Systems  All other  systems reviewed and are negative.    Allergies  Celery oil; Codone; Dilaudid; and Strawberry  Home Medications   Current Outpatient Rx  Name  Route  Sig  Dispense  Refill  . acyclovir (ZOVIRAX) 400 MG tablet   Oral   Take 2 tablets (800 mg total) by mouth 5 (five) times daily.   50 tablet   0   . metroNIDAZOLE (FLAGYL) 500 MG tablet   Oral   Take 1 tablet (500 mg total) by mouth 2 (two) times daily.   14 tablet   0   . naproxen (NAPROSYN) 500 MG tablet   Oral   Take 1 tablet (500 mg total) by mouth 2 (two) times daily with a meal.   30 tablet   0    BP 142/103  Pulse 64  Temp(Src) 98.2 F (36.8 C) (Oral)  Resp 17  Ht 5' 4"  (1.626 m)  Wt 148 lb (67.132 kg)  BMI 25.39 kg/m2  SpO2 98%  LMP 08/08/2013 Physical Exam  Nursing note and vitals reviewed. Constitutional: She appears well-developed and well-nourished. No distress.  HENT:  Head: Normocephalic and atraumatic.  Mouth/Throat: Oropharynx is clear and moist. No oropharyngeal exudate.  Eyes: Conjunctivae and EOM are  normal. Pupils are equal, round, and reactive to light. Right eye exhibits no discharge. Left eye exhibits no discharge. No scleral icterus.  Neck: Normal range of motion. Neck supple. No JVD present. No thyromegaly present.  Cardiovascular: Normal rate, regular rhythm, normal heart sounds and intact distal pulses.  Exam reveals no gallop and no friction rub.   No murmur heard. Pulmonary/Chest: Effort normal and breath sounds normal. No respiratory distress. She has no wheezes. She has no rales.  Abdominal: Soft. Bowel sounds are normal. She exhibits no distension and no mass. There is no tenderness.  No abdominal tenderness to palpation  Genitourinary:  Chaperone present for exam, patient has a pierced clitoral hood, underneath the hood there are 2 very small shallow erythematous tender ulcers, there is foul-smelling purulent discharge coming from the vaginal orifice  Copious amount whitish creamy  d/c, no CMT, no adnexal ttp or masses.  No cervical lesions  Musculoskeletal: Normal range of motion. She exhibits no edema and no tenderness.  Lymphadenopathy:    She has no cervical adenopathy.  Neurological: She is alert. Coordination normal.  Skin: Skin is warm and dry. No rash noted. No erythema.  Psychiatric: She has a normal mood and affect. Her behavior is normal.    ED Course  Procedures (including critical care time) Labs Review Labs Reviewed  WET PREP, GENITAL - Abnormal; Notable for the following:    Trich, Wet Prep MODERATE (*)    Clue Cells Wet Prep HPF POC MANY (*)    WBC, Wet Prep HPF POC MODERATE (*)    All other components within normal limits  URINALYSIS, ROUTINE W REFLEX MICROSCOPIC - Abnormal; Notable for the following:    APPearance CLOUDY (*)    All other components within normal limits  GC/CHLAMYDIA PROBE AMP  POCT PREGNANCY, URINE   Imaging Review No results found.  EKG Interpretation   None       MDM   1. Vaginal pain   2. Trichomonas vaginitis   3. Bacterial vaginosis    The exam is consistent with a possible herpetic infection, this is not surprising since the patient has a history of multiple sexual transmitted infections in the past. She will be tested and treated for the usual suspects, will start acyclovir as well, Toradol ordered for the patient's ulcer pain  Patient informed of her results, medications given to treat possible PID, home with Flagyl, acyclovir, Naprosyn and referred to STD clinic testing.  Johnna Acosta, MD 08/21/13 [phone]

## 2013-08-21 LAB — GC/CHLAMYDIA PROBE AMP
CT Probe RNA: NEGATIVE
GC Probe RNA: NEGATIVE

## 2013-08-21 MED ORDER — ACYCLOVIR 400 MG PO TABS: 800.0000 mg | ORAL_TABLET | Freq: Every day | ORAL | Status: DC

## 2013-08-21 MED ORDER — METRONIDAZOLE 500 MG PO TABS: 500.0000 mg | ORAL_TABLET | Freq: Two times a day (BID) | ORAL | Status: DC

## 2013-08-21 MED ORDER — NAPROXEN 500 MG PO TABS: 500.0000 mg | ORAL_TABLET | Freq: Two times a day (BID) | ORAL | Status: DC

## 2013-08-21 MED ORDER — AZITHROMYCIN 250 MG PO TABS
1000.0000 mg | ORAL_TABLET | Freq: Once | ORAL | Status: AC
  Administered 2013-08-21: 1000 mg via ORAL
  Filled 2013-08-21: qty 4

## 2013-08-21 MED ORDER — LIDOCAINE HCL (PF) 1 % IJ SOLN
INTRAMUSCULAR | Status: AC
  Administered 2013-08-21: 1 mL
  Filled 2013-08-21: qty 5

## 2013-08-21 NOTE — Discharge Instructions (Signed)
Pelvic Infection  If you have been diagnosed with a pelvic infection such as a sexually transmitted disease, you will need to be treated with antibiotics. Please take the medicines as prescribed. Some of these tests do not come back for 1-2 days in which case if they turn positive you will receive a phone call to let you know. If you are contacted and do have an infection consistent with a sexually transmitted disease, then you will need to tell any and all sexual partners that you have had in the last 6 months no so that they can be tested and treated as well. If you should develop severe or worsening pain in your abdomen or the pelvis or develop severe fevers,nausea or vomiting that prevent you from taking your medications, return to the emergency department immediately. Otherwise contact your local physician or county health department for a follow up appointment to complete STD testing including HIV and syphilis.  See the list of phone numbers below.  RESOURCE GUIDE  Dental Problems  Patients with Medicaid: [facility] Phone:  [phone]                                                  Phone:  [phone]  If unable to pay or uninsured, contact:  [facility] or [facility]. to become qualified for the adult dental clinic.  Chronic Pain Problems Contact [facility]  [phone] Patients need to be referred by their primary care doctor.  Insufficient Money for Medicine Contact United Way:  call "211" or [place] [phone].  No Primary Care Doctor Call [facility]  [phone] Other agencies that provide inexpensive medical care    [place]  [phone]    [facility]  [place]  [phone]    [facility]  [phone]    [facility]  [place]  [place]  [phone] [place]   [phone] (emergency services [phone])  Substance Abuse Resources [facility]  [phone] [facility] [phone] [facility] [place] [phone] [facility] [phone] Residential & Outpatient Substance Abuse Program  [phone]  Abuse/Neglect [place] [phone] [place] [phone] (After Hours)  Emergency [place] [phone]  [place] at the [place] [phone] [place] [phone]  MRSA Hotline #:   (717)567-8658    [facility] Dept. [address] [place]  [place] Phone:  [phone]                                   Phone:  [phone]                 Phone:  [place] Phone:  [place] [phone] (After Hours)

## 2013-10-26 ENCOUNTER — Emergency Department (HOSPITAL_COMMUNITY)
Admission: EM | Admit: 2013-10-26 | Discharge: 2013-10-27 | Disposition: A | Discharge status: Home / Self Care | Payer: Medicaid Other | Attending: Emergency Medicine | Admitting: Emergency Medicine

## 2013-10-26 DIAGNOSIS — Z8751 Personal history of pre-term labor: Secondary | ICD-10-CM | POA: Insufficient documentation

## 2013-10-26 DIAGNOSIS — R6883 Chills (without fever): Secondary | ICD-10-CM | POA: Insufficient documentation

## 2013-10-26 DIAGNOSIS — Z87440 Personal history of urinary (tract) infections: Secondary | ICD-10-CM | POA: Insufficient documentation

## 2013-10-26 DIAGNOSIS — Z87442 Personal history of urinary calculi: Secondary | ICD-10-CM | POA: Insufficient documentation

## 2013-10-26 DIAGNOSIS — IMO0001 Reserved for inherently not codable concepts without codable children: Secondary | ICD-10-CM

## 2013-10-26 DIAGNOSIS — K089 Disorder of teeth and supporting structures, unspecified: Secondary | ICD-10-CM | POA: Insufficient documentation

## 2013-10-26 DIAGNOSIS — F17200 Nicotine dependence, unspecified, uncomplicated: Secondary | ICD-10-CM | POA: Insufficient documentation

## 2013-10-26 DIAGNOSIS — H6690 Otitis media, unspecified, unspecified ear: Secondary | ICD-10-CM | POA: Insufficient documentation

## 2013-10-26 DIAGNOSIS — H6691 Otitis media, unspecified, right ear: Secondary | ICD-10-CM

## 2013-10-26 DIAGNOSIS — Z8742 Personal history of other diseases of the female genital tract: Secondary | ICD-10-CM | POA: Insufficient documentation

## 2013-10-26 DIAGNOSIS — K0889 Other specified disorders of teeth and supporting structures: Secondary | ICD-10-CM

## 2013-10-26 DIAGNOSIS — R011 Cardiac murmur, unspecified: Secondary | ICD-10-CM | POA: Insufficient documentation

## 2013-10-26 DIAGNOSIS — Z8619 Personal history of other infectious and parasitic diseases: Secondary | ICD-10-CM | POA: Insufficient documentation

## 2013-10-26 DIAGNOSIS — K029 Dental caries, unspecified: Secondary | ICD-10-CM | POA: Insufficient documentation

## 2013-10-26 DIAGNOSIS — R030 Elevated blood-pressure reading, without diagnosis of hypertension: Secondary | ICD-10-CM | POA: Insufficient documentation

## 2013-10-26 DIAGNOSIS — Z8659 Personal history of other mental and behavioral disorders: Secondary | ICD-10-CM | POA: Insufficient documentation

## 2013-10-26 DIAGNOSIS — M542 Cervicalgia: Secondary | ICD-10-CM | POA: Insufficient documentation

## 2013-10-26 NOTE — ED Notes (Signed)
Pt states that she had a filling fall out on the R lower side and her teeth felt better but now her R jaw and her external ear are hurting as well. Alert and oriented. Ambulatory.

## 2013-10-27 MED ORDER — IBUPROFEN 800 MG PO TABS
800.0000 mg | ORAL_TABLET | Freq: Once | ORAL | Status: AC
  Administered 2013-10-27: 800 mg via ORAL
  Filled 2013-10-27: qty 1

## 2013-10-27 MED ORDER — IBUPROFEN 600 MG PO TABS: 600.0000 mg | ORAL_TABLET | Freq: Four times a day (QID) | ORAL | Status: DC | PRN

## 2013-10-27 MED ORDER — OXYCODONE-ACETAMINOPHEN 5-325 MG PO TABS: 1.0000 | ORAL_TABLET | Freq: Four times a day (QID) | ORAL | Status: DC | PRN

## 2013-10-27 MED ORDER — ONDANSETRON 4 MG PO TBDP
4.0000 mg | ORAL_TABLET | Freq: Once | ORAL | Status: AC
  Administered 2013-10-27: 4 mg via ORAL
  Filled 2013-10-27: qty 1

## 2013-10-27 MED ORDER — AMOXICILLIN 500 MG PO CAPS: 500.0000 mg | ORAL_CAPSULE | Freq: Three times a day (TID) | ORAL | Status: DC

## 2013-10-27 NOTE — Discharge Instructions (Signed)
Take medications as directed. Follow up with dentist as listed above in 24-48 hours.    Dental Pain A tooth ache may be caused by cavities (tooth decay). Cavities expose the nerve of the tooth to air and hot or cold temperatures. It may come from an infection or abscess (also called a boil or furuncle) around your tooth. It is also often caused by dental caries (tooth decay). This causes the pain you are having. DIAGNOSIS  Your caregiver can diagnose this problem by exam. TREATMENT   If caused by an infection, it may be treated with medications which kill germs (antibiotics) and pain medications as prescribed by your caregiver. Take medications as directed.  Only take over-the-counter or prescription medicines for pain, discomfort, or fever as directed by your caregiver.  Whether the tooth ache today is caused by infection or dental disease, you should see your dentist as soon as possible for further care. SEEK MEDICAL CARE IF: The exam and treatment you received today has been provided on an emergency basis only. This is not a substitute for complete medical or dental care. If your problem worsens or new problems (symptoms) appear, and you are unable to meet with your dentist, call or return to this location. SEEK IMMEDIATE MEDICAL CARE IF:   You have a fever.  You develop redness and swelling of your face, jaw, or neck.  You are unable to open your mouth.  You have severe pain uncontrolled by pain medicine. MAKE SURE YOU:   Understand these instructions.  Will watch your condition.  Will get help right away if you are not doing well or get worse. Document Released: 07/09/2005 Document Revised: 10/01/2011 Document Reviewed: 02/25/2008 [facility] Patient Information 2014 [place].  Otitis Media, Adult Otitis media is redness, soreness, and puffiness (swelling) in the space just behind your eardrum (middle ear). It may be caused by allergies or infection. It often happens  along with a cold. HOME CARE  Take your medicine as told. Finish it even if you start to feel better.  Only take over-the-counter or prescription medicines for pain, discomfort, or fever as told by your doctor.  Follow up with your doctor as told. GET HELP IF:  You have otitis media only in one ear or bleeding from your nose or both.  You notice a lump on your neck.  You are not getting better in 3 5 days.  You feel worse instead of better. GET HELP RIGHT AWAY IF:   You have pain that is not helped with medicine.  You have puffiness, redness, or pain around your ear.  You get a stiff neck.  You cannot move part of your face (paralysis).  You notice that the bone behind your ear hurts when you touch it. MAKE SURE YOU:   Understand these instructions.  Will watch your condition.  Will get help right away if you are not doing well or get worse. Document Released: 12/26/2007 Document Revised: 03/11/2013 Document Reviewed: 02/03/2013 [facility] Patient Information 2014 [place], [place].

## 2013-10-27 NOTE — ED Notes (Signed)
Dental Box set up at bedside per PA request.

## 2013-10-27 NOTE — ED Provider Notes (Signed)
CSN: [id number]     Arrival date & time 10/26/13  2148 History   First MD Initiated Contact with Patient 10/27/13 0031     Chief Complaint  Patient presents with  . Facial Pain  . Otalgia     (Consider location/radiation/quality/duration/timing/severity/associated sxs/prior Treatment) Patient is a 28 y.o. female presenting with ear pain.  Otalgia  28 yo female presents with Right ear pain right sided neck pain x 1 week. Pain is sharp throbbing 10/10 pain that is constant. Better when laying on left side. Admits to chills, diminished hearing on right ear, drainage from right ear, painful swallowing. Denies any sore throat, cough, congestion, or dizziness. Admits to nausea, Denies vomiting. Denies similar episodes in past. PMH significant for chron's, but denies taking any medications for it.  Past Medical History  Diagnosis Date  . Crohn disease   . Pregnancy induced hypertension   . Preterm labor   . Heart murmur   . Urinary tract infection   . Kidney stone   . Anxiety   . Depression     No specific treatment  . PID (acute pelvic inflammatory disease)   . Chlamydia   . Gonorrhea   . Genital warts    Past Surgical History  Procedure Laterality Date  . Abortion      x2   Family History  Problem Relation Age of Onset  . Anesthesia problems Neg Hx   . Asthma Mother   . Heart disease Mother   . Diabetes Mother   . Hypertension Mother   . Cancer Father     Breast  . Asthma Sister   . Depression Sister   . Diabetes Sister   . Hypertension Sister   . Diabetes Other    History  Substance Use Topics  . Smoking status: Current Every Day Smoker -- 0.25 packs/day for 6 years    Types: Cigarettes  . Smokeless tobacco: Not on file  . Alcohol Use: Yes     Comment: 2-3 drinks/wk   OB History   Grav Para Term Preterm Abortions TAB SAB Ect Mult Living   4 2 1 1 2 2  0 0 0 2     Review of Systems  HENT: Positive for ear pain.   All other systems reviewed and are  negative.      Allergies  Celery oil; Codone; Dilaudid; and Strawberry  Home Medications   Current Outpatient Rx  Name  Route  Sig  Dispense  Refill  . ibuprofen (ADVIL,MOTRIN) 200 MG tablet   Oral   Take 400 mg by mouth every 6 (six) hours as needed (pain).         Marland Kitchen amoxicillin (AMOXIL) 500 MG capsule   Oral   Take 1 capsule (500 mg total) by mouth 3 (three) times daily.   30 capsule   0   . ibuprofen (ADVIL,MOTRIN) 600 MG tablet   Oral   Take 1 tablet (600 mg total) by mouth every 6 (six) hours as needed.   30 tablet   0   . oxyCODONE-acetaminophen (PERCOCET/ROXICET) 5-325 MG per tablet   Oral   Take 1 tablet by mouth every 6 (six) hours as needed for severe pain.   12 tablet   0    BP 142/102  Pulse 72  Temp(Src) 98.4 F (36.9 C) (Oral)  Resp 18  SpO2 100%  LMP 09/29/2013 Physical Exam  Nursing note and vitals reviewed. Constitutional: She is oriented to person, place, and time. She appears  well-developed and well-nourished. No distress.  HENT:  Head: Normocephalic and atraumatic.  Right Ear: Ear canal normal. No drainage or swelling. Tympanic membrane is injected and erythematous. A middle ear effusion is present.  Left Ear: Ear canal normal. No drainage or swelling. Tympanic membrane is injected. Tympanic membrane is not erythematous.  No middle ear effusion.  Nose: Nose normal. Right sinus exhibits no maxillary sinus tenderness and no frontal sinus tenderness. Left sinus exhibits no maxillary sinus tenderness and no frontal sinus tenderness.  Mouth/Throat: Uvula is midline, oropharynx is clear and moist and mucous membranes are normal. No trismus in the jaw. Dental caries present. No uvula swelling. No oropharyngeal exudate, posterior oropharyngeal edema or posterior oropharyngeal erythema.    No evidence of gingival swelling or obvious abscess formation.   Eyes: Conjunctivae are normal. Right eye exhibits no discharge. Left eye exhibits no discharge.  No scleral icterus.  Neck: Phonation normal. Neck supple. No JVD present. No rigidity. No tracheal deviation, no edema and no erythema present.  Cardiovascular: Normal rate and regular rhythm.  Exam reveals no gallop and no friction rub.   No murmur heard. Pulmonary/Chest: Effort normal. No stridor. No respiratory distress. She has no wheezes. She has no rhonchi. She has no rales.  Musculoskeletal: She exhibits no edema.  Lymphadenopathy:    She has no cervical adenopathy.  Neurological: She is alert and oriented to person, place, and time.  Skin: Skin is warm and dry. She is not diaphoretic.  Psychiatric: She has a normal mood and affect. Her behavior is normal.    ED Course  Dental Date/Time: 10/27/2013 3:53 AM Performed by: Sherrie George Authorized by: Sherrie George Consent: Verbal consent obtained. Risks and benefits: risks, benefits and alternatives were discussed Consent given by: patient Patient understanding: patient states understanding of the procedure being performed Patient identity confirmed: verbally with patient and arm band Time out: Immediately prior to procedure a "time out" was called to verify the correct patient, procedure, equipment, support staff and site/side marked as required. Preparation: Patient was prepped and draped in the usual sterile fashion. Local anesthesia used: yes Local anesthetic: bupivacaine 0.25% without epinephrine Anesthetic total: 2 ml Patient tolerance: Patient tolerated the procedure well with no immediate complications. Comments: Inferior Alveolar block    (including critical care time) Labs Review Labs Reviewed - No data to display Imaging Review No results found.   EKG Interpretation None      MDM   Final diagnoses:  Otitis media of right ear  Tooth pain  Elevated BP  Patient afebrile.  BP elevated, similar to prior visit on 08/20/13. Patient presents with dental and ear pain. Exam shows no obvious abscess  formation of tooth fracture. No evidence of deep tissue infection. No concern for ludwigs angina or peritonsilar abscess.   Patient's pain controlled with local dental nerve block. Patient started on antibiotics and advised to follow up with dentist in 24-48 hour for further management of tooth pain. Patient agrees with plan. Discharged in good condition.    Meds given in ED:  Medications  ibuprofen (ADVIL,MOTRIN) tablet 800 mg (800 mg Oral Given 10/27/13 0216)  ondansetron (ZOFRAN-ODT) disintegrating tablet 4 mg (4 mg Oral Given 10/27/13 0217)    Discharge Medication List as of 10/27/2013  3:53 AM    START taking these medications   Details  amoxicillin (AMOXIL) 500 MG capsule Take 1 capsule (500 mg total) by mouth 3 (three) times daily., Starting 10/27/2013, Until Discontinued, Print    !!  ibuprofen (ADVIL,MOTRIN) 600 MG tablet Take 1 tablet (600 mg total) by mouth every 6 (six) hours as needed., Starting 10/27/2013, Until Discontinued, Print    oxyCODONE-acetaminophen (PERCOCET/ROXICET) 5-325 MG per tablet Take 1 tablet by mouth every 6 (six) hours as needed for severe pain., Starting 10/27/2013, Until Discontinued, Print     !! - Potential duplicate medications found. Please discuss with provider.          Sherrie George, PA-C 10/28/13 2349

## 2013-10-28 NOTE — ED Provider Notes (Signed)
Medical screening examination/treatment/procedure(s) were conducted as a shared visit with non-physician practitioner(s) and myself.  I personally evaluated the patient during the encounter.  Right dental pain radiating to her face and ear. On exam tender right lower third molar with mild trismus. No facial swelling or palpable fluctuance.  MLP performed dental block with relief of symptoms. Plan outpatient followup with prescriptions provided / referrals provided.  Teressa Lower, MD 10/28/13 [phone]

## 2014-03-16 ENCOUNTER — Emergency Department (HOSPITAL_COMMUNITY)
Admission: EM | Admit: 2014-03-16 | Discharge: 2014-03-16 | Disposition: A | Discharge status: Home / Self Care | Payer: Medicaid Other | Attending: Emergency Medicine | Admitting: Emergency Medicine

## 2014-03-16 DIAGNOSIS — Z8659 Personal history of other mental and behavioral disorders: Secondary | ICD-10-CM | POA: Insufficient documentation

## 2014-03-16 DIAGNOSIS — Z8742 Personal history of other diseases of the female genital tract: Secondary | ICD-10-CM | POA: Insufficient documentation

## 2014-03-16 DIAGNOSIS — M549 Dorsalgia, unspecified: Secondary | ICD-10-CM | POA: Insufficient documentation

## 2014-03-16 DIAGNOSIS — Z3202 Encounter for pregnancy test, result negative: Secondary | ICD-10-CM | POA: Insufficient documentation

## 2014-03-16 DIAGNOSIS — Z87442 Personal history of urinary calculi: Secondary | ICD-10-CM | POA: Insufficient documentation

## 2014-03-16 DIAGNOSIS — R011 Cardiac murmur, unspecified: Secondary | ICD-10-CM | POA: Insufficient documentation

## 2014-03-16 DIAGNOSIS — F17200 Nicotine dependence, unspecified, uncomplicated: Secondary | ICD-10-CM | POA: Insufficient documentation

## 2014-03-16 DIAGNOSIS — Z87440 Personal history of urinary (tract) infections: Secondary | ICD-10-CM | POA: Insufficient documentation

## 2014-03-16 DIAGNOSIS — Z8619 Personal history of other infectious and parasitic diseases: Secondary | ICD-10-CM | POA: Insufficient documentation

## 2014-03-16 DIAGNOSIS — N12 Tubulo-interstitial nephritis, not specified as acute or chronic: Secondary | ICD-10-CM | POA: Insufficient documentation

## 2014-03-16 LAB — POC URINE PREG, ED: Preg Test, Ur: NEGATIVE

## 2014-03-16 LAB — BASIC METABOLIC PANEL
Anion gap: 12 (ref 5–15)
BUN: 11 mg/dL (ref 6–23)
CALCIUM: 8.9 mg/dL (ref 8.4–10.5)
CHLORIDE: 104 meq/L (ref 96–112)
CO2: 23 meq/L (ref 19–32)
CREATININE: 0.93 mg/dL (ref 0.50–1.10)
GFR calc non Af Amer: 83 mL/min — ABNORMAL LOW (ref >90)
Glucose, Bld: 70 mg/dL (ref 70–99)
Potassium: 3.6 mEq/L — ABNORMAL LOW (ref 3.7–5.3)
SODIUM: 139 meq/L (ref 137–147)

## 2014-03-16 LAB — URINALYSIS, ROUTINE W REFLEX MICROSCOPIC
Bilirubin Urine: NEGATIVE
GLUCOSE, UA: NEGATIVE mg/dL
HGB URINE DIPSTICK: NEGATIVE
KETONES UR: NEGATIVE mg/dL
Nitrite: NEGATIVE
PH: 6 (ref 5.0–8.0)
Protein, ur: NEGATIVE mg/dL
SPECIFIC GRAVITY, URINE: 1.025 (ref 1.005–1.030)
Urobilinogen, UA: 0.2 mg/dL (ref 0.0–1.0)

## 2014-03-16 LAB — URINE MICROSCOPIC-ADD ON

## 2014-03-16 LAB — CBC WITH DIFFERENTIAL/PLATELET
Basophils Absolute: 0 10*3/uL (ref 0.0–0.1)
Basophils Relative: 0 % (ref 0–1)
EOS PCT: 1 % (ref 0–5)
Eosinophils Absolute: 0.1 10*3/uL (ref 0.0–0.7)
HCT: 38.3 % (ref 36.0–46.0)
Hemoglobin: 12.3 g/dL (ref 12.0–15.0)
LYMPHS PCT: 22 % (ref 12–46)
Lymphs Abs: 1.5 10*3/uL (ref 0.7–4.0)
MCH: 28.8 pg (ref 26.0–34.0)
MCHC: 32.1 g/dL (ref 30.0–36.0)
MCV: 89.7 fL (ref 78.0–100.0)
MONO ABS: 0.8 10*3/uL (ref 0.1–1.0)
Monocytes Relative: 12 % (ref 3–12)
NEUTROS ABS: 4.4 10*3/uL (ref 1.7–7.7)
Neutrophils Relative %: 65 % (ref 43–77)
PLATELETS: 232 10*3/uL (ref 150–400)
RBC: 4.27 MIL/uL (ref 3.87–5.11)
RDW: 12.7 % (ref 11.5–15.5)
WBC: 6.8 10*3/uL (ref 4.0–10.5)

## 2014-03-16 MED ORDER — CIPROFLOXACIN HCL 500 MG PO TABS: 500.0000 mg | ORAL_TABLET | Freq: Two times a day (BID) | ORAL | Status: DC

## 2014-03-16 MED ORDER — OXYCODONE-ACETAMINOPHEN 5-325 MG PO TABS: 1.0000 | ORAL_TABLET | ORAL | Status: DC | PRN

## 2014-03-16 MED ORDER — PHENAZOPYRIDINE HCL 200 MG PO TABS: 200.0000 mg | ORAL_TABLET | Freq: Three times a day (TID) | ORAL | Status: DC

## 2014-03-16 MED ORDER — ONDANSETRON 4 MG PO TBDP: 4.0000 mg | ORAL_TABLET | Freq: Three times a day (TID) | ORAL | Status: DC | PRN

## 2014-03-16 MED ORDER — OXYCODONE-ACETAMINOPHEN 5-325 MG PO TABS
1.0000 | ORAL_TABLET | Freq: Once | ORAL | Status: AC
  Administered 2014-03-16: 1 via ORAL
  Filled 2014-03-16: qty 1

## 2014-03-16 MED ORDER — KETOROLAC TROMETHAMINE 30 MG/ML IJ SOLN
30.0000 mg | Freq: Once | INTRAMUSCULAR | Status: AC
  Administered 2014-03-16: 30 mg via INTRAMUSCULAR
  Filled 2014-03-16: qty 1

## 2014-03-16 MED ORDER — DIPHENHYDRAMINE HCL 25 MG PO CAPS
25.0000 mg | ORAL_CAPSULE | Freq: Once | ORAL | Status: AC
  Administered 2014-03-16: 25 mg via ORAL
  Filled 2014-03-16: qty 1

## 2014-03-16 NOTE — ED Notes (Signed)
Patient in NAD at time of d/c, ambulatory with no difficulty

## 2014-03-16 NOTE — ED Notes (Signed)
C/o left falnk pain onset 1 week ago, states the pain is worse today. C/o urinary freq. And only able to urinate small amt.

## 2014-03-16 NOTE — ED Provider Notes (Signed)
CSN: [id number]     Arrival date & time 03/16/14  1151 History   First MD Initiated Contact with Patient 03/16/14 1526     Chief Complaint  Patient presents with  . Back Pain     (Consider location/radiation/quality/duration/timing/severity/associated sxs/prior Treatment) HPI Comments: Patient is a [id number] 28 year old female presenting to the emergency department for one week for right sided flank pain with associated dysuria, urinary frequency and urgency, decreased urine. Endorses associated nausea and intermittent episodes of mild throbbing headache. Alleviating factors: none. Aggravating factors: urination. Medications tried prior to arrival: Ibuprofen. Denies any fevers, vomiting, abdominal pain, vaginal bleeding or discharge, constipation or diarrhea. Last menstrual period was August 1.    Patient is a 28 y.o. female presenting with back pain.  Back Pain   Past Medical History  Diagnosis Date  . Crohn disease   . Pregnancy induced hypertension   . Preterm labor   . Heart murmur   . Urinary tract infection   . Kidney stone   . Anxiety   . Depression     No specific treatment  . PID (acute pelvic inflammatory disease)   . Chlamydia   . Gonorrhea   . Genital warts    Past Surgical History  Procedure Laterality Date  . Abortion      x2   Family History  Problem Relation Age of Onset  . Anesthesia problems Neg Hx   . Asthma Mother   . Heart disease Mother   . Diabetes Mother   . Hypertension Mother   . Cancer Father     Breast  . Asthma Sister   . Depression Sister   . Diabetes Sister   . Hypertension Sister   . Diabetes Other    History  Substance Use Topics  . Smoking status: Current Every Day Smoker -- 0.25 packs/day for 6 years    Types: Cigarettes  . Smokeless tobacco: Not on file  . Alcohol Use: Yes     Comment: 2-3 drinks/wk   OB History   Grav Para Term Preterm Abortions TAB SAB Ect Mult Living   4 2 1 1 2 2  0 0 0 2     Review of Systems   Musculoskeletal: Positive for back pain.  All other systems reviewed and are negative.     Allergies  Celery oil; Codone; Dilaudid; and Strawberry  Home Medications   Prior to Admission medications   Medication Sig Start Date End Date Taking? Authorizing Provider  ibuprofen (ADVIL,MOTRIN) 200 MG tablet Take 400 mg by mouth every 6 (six) hours as needed (pain).   Yes Historical Provider, MD  ciprofloxacin (CIPRO) 500 MG tablet Take 1 tablet (500 mg total) by mouth every 12 (twelve) hours. 03/16/14   Najeh Credit L Yovanna Cogan, PA-C  ondansetron (ZOFRAN ODT) 4 MG disintegrating tablet Take 1 tablet (4 mg total) by mouth every 8 (eight) hours as needed for nausea or vomiting. 03/16/14   Stephani Police Kali Ambler, PA-C  oxyCODONE-acetaminophen (PERCOCET/ROXICET) 5-325 MG per tablet Take 1 tablet by mouth every 4 (four) hours as needed for severe pain. May take 2 tablets PO q 6 hours for severe pain - Do not take with Tylenol as this tablet already contains tylenol 03/16/14   Kartel Wolbert L Stefana Lodico, PA-C  phenazopyridine (PYRIDIUM) 200 MG tablet Take 1 tablet (200 mg total) by mouth 3 (three) times daily. 03/16/14   Katelynne Revak L Latifah Padin, PA-C   BP 149/112  Pulse 76  Temp(Src) 98.4 F (36.9 C) (Oral)  Resp 17  SpO2 100% Physical Exam  Nursing note and vitals reviewed. Constitutional: She is oriented to person, place, and time. She appears well-developed and well-nourished. No distress.  HENT:  Head: Normocephalic and atraumatic.  Right Ear: External ear normal.  Left Ear: External ear normal.  Nose: Nose normal.  Mouth/Throat: Oropharynx is clear and moist.  Eyes: Conjunctivae are normal.  Neck: Normal range of motion. Neck supple.  Cardiovascular: Normal rate, regular rhythm and normal heart sounds.   Pulmonary/Chest: Effort normal.  Abdominal: Soft. Normal appearance and bowel sounds are normal. She exhibits no distension. There is no tenderness. There is CVA tenderness (right). There is  no rigidity, no rebound and no guarding.  Musculoskeletal: Normal range of motion.  Neurological: She is alert and oriented to person, place, and time.  Skin: Skin is warm and dry. She is not diaphoretic.  Psychiatric: She has a normal mood and affect.    ED Course  Procedures (including critical care time) Medications  oxyCODONE-acetaminophen (PERCOCET/ROXICET) 5-325 MG per tablet 1 tablet (1 tablet Oral Given 03/16/14 1433)  ketorolac (TORADOL) 30 MG/ML injection 30 mg (30 mg Intramuscular Given 03/16/14 1559)    Labs Review Labs Reviewed  URINALYSIS, ROUTINE W REFLEX MICROSCOPIC - Abnormal; Notable for the following:    APPearance CLOUDY (*)    Leukocytes, UA SMALL (*)    All other components within normal limits  URINE MICROSCOPIC-ADD ON - Abnormal; Notable for the following:    Squamous Epithelial / LPF MANY (*)    Bacteria, UA FEW (*)    All other components within normal limits  BASIC METABOLIC PANEL - Abnormal; Notable for the following:    Potassium 3.6 (*)    GFR calc non Af Amer 83 (*)    All other components within normal limits  CBC WITH DIFFERENTIAL  POC URINE PREG, ED    Imaging Review No results found.   EKG Interpretation None      MDM   Final diagnoses:  Pyelonephritis    Filed Vitals:   03/16/14 1406  BP: 149/112  Pulse: 76  Temp: 98.4 F (36.9 C)  Resp: 17   Afebrile, NAD, non-toxic appearing, AAOx4.   Patient with uncomplicated pyelonephritis. No indications for hospitalization. Will treat symptomatically as an outpatient. Advised PCP f/u. Return precautions discussed. Patient is agreeable to plan. Patient is stable at time of discharge      Harlow Mares, PA-C 03/16/14 1650

## 2014-03-16 NOTE — Discharge Instructions (Signed)
Please follow up with your primary care physician in 1-2 days. If you do not have one please call the [facility] number listed above. Please take your antibiotic until completion. Please take pain medication and/or muscle relaxants as prescribed and as needed for pain. Please do not drive on narcotic pain medication or on muscle relaxants. Please read all discharge instructions and return precautions.    Pyelonephritis, Adult Pyelonephritis is a kidney infection. A kidney infection can happen quickly, or it can last for a long time. HOME CARE   Take your medicine (antibiotics) as told. Finish it even if you start to feel better.  Keep all doctor visits as told.  Drink enough fluids to keep your pee (urine) clear or pale yellow.  Only take medicine as told by your doctor. GET HELP RIGHT AWAY IF:   You have a fever or lasting symptoms for more than 2-3 days.  You have a fever and your symptoms suddenly get worse.  You cannot take your medicine or drink fluids as told.  You have chills and shaking.  You feel very weak or pass out (faint).  You do not feel better after 2 days. MAKE SURE YOU:  Understand these instructions.  Will watch your condition.  Will get help right away if you are not doing well or get worse. Document Released: 08/16/2004 Document Revised: 01/08/2012 Document Reviewed: 12/27/2010 [facility] Patient Information 2015 [place], [place]. This information is not intended to replace advice given to you by your health care provider. Make sure you discuss any questions you have with your health care provider.

## 2014-03-19 NOTE — ED Provider Notes (Signed)
Medical screening examination/treatment/procedure(s) were performed by non-physician practitioner and as supervising physician I was immediately available for consultation/collaboration.     Veryl Speak, MD 03/19/14 [phone]

## 2014-05-24 ENCOUNTER — Encounter (HOSPITAL_COMMUNITY): Payer: Self-pay | Admitting: Emergency Medicine

## 2014-09-06 ENCOUNTER — Encounter (HOSPITAL_COMMUNITY): Payer: Self-pay | Admitting: Emergency Medicine

## 2014-09-06 ENCOUNTER — Emergency Department (HOSPITAL_COMMUNITY)
Admission: EM | Admit: 2014-09-06 | Discharge: 2014-09-06 | Disposition: A | Discharge status: Home / Self Care | Payer: Medicaid Other | Attending: Emergency Medicine | Admitting: Emergency Medicine

## 2014-09-06 DIAGNOSIS — Z8619 Personal history of other infectious and parasitic diseases: Secondary | ICD-10-CM | POA: Insufficient documentation

## 2014-09-06 DIAGNOSIS — Z8659 Personal history of other mental and behavioral disorders: Secondary | ICD-10-CM | POA: Insufficient documentation

## 2014-09-06 DIAGNOSIS — Z8742 Personal history of other diseases of the female genital tract: Secondary | ICD-10-CM | POA: Insufficient documentation

## 2014-09-06 DIAGNOSIS — K088 Other specified disorders of teeth and supporting structures: Secondary | ICD-10-CM | POA: Insufficient documentation

## 2014-09-06 DIAGNOSIS — Z720 Tobacco use: Secondary | ICD-10-CM | POA: Insufficient documentation

## 2014-09-06 DIAGNOSIS — K007 Teething syndrome: Secondary | ICD-10-CM | POA: Insufficient documentation

## 2014-09-06 DIAGNOSIS — Z87442 Personal history of urinary calculi: Secondary | ICD-10-CM | POA: Insufficient documentation

## 2014-09-06 DIAGNOSIS — R011 Cardiac murmur, unspecified: Secondary | ICD-10-CM | POA: Insufficient documentation

## 2014-09-06 DIAGNOSIS — G479 Sleep disorder, unspecified: Secondary | ICD-10-CM | POA: Insufficient documentation

## 2014-09-06 DIAGNOSIS — K0889 Other specified disorders of teeth and supporting structures: Secondary | ICD-10-CM

## 2014-09-06 DIAGNOSIS — Z79899 Other long term (current) drug therapy: Secondary | ICD-10-CM | POA: Insufficient documentation

## 2014-09-06 DIAGNOSIS — Z87440 Personal history of urinary (tract) infections: Secondary | ICD-10-CM | POA: Insufficient documentation

## 2014-09-06 MED ORDER — HYDROCODONE-ACETAMINOPHEN 5-325 MG PO TABS: 1.0000 | ORAL_TABLET | Freq: Four times a day (QID) | ORAL | Status: DC | PRN

## 2014-09-06 MED ORDER — PENICILLIN V POTASSIUM 500 MG PO TABS: 500.0000 mg | ORAL_TABLET | Freq: Four times a day (QID) | ORAL | Status: DC

## 2014-09-06 NOTE — Discharge Instructions (Signed)

## 2014-09-06 NOTE — ED Notes (Signed)
Per patient states right lower dental pain, now right facial swelling

## 2014-09-06 NOTE — ED Provider Notes (Signed)
CSN: [id number]     Arrival date & time 09/06/14  1412 History   None    Chief Complaint  Patient presents with  . Dental Pain     (Consider location/radiation/quality/duration/timing/severity/associated sxs/prior Treatment) HPI Comments: Patient presents emergency department with chief complaint of dental pain. She states that she lost a filling a couple of days ago, and now has noticed right lower jaw swelling. She reports pain to her jaw. She reports swelling to her face. She denies any fevers chills. She states that there is a bad taste in her mouth. She denies taking anything for her symptoms. She does not have a dentist. She denies any difficulty swallowing.  The history is provided by the patient. No language interpreter was used.    Past Medical History  Diagnosis Date  . Crohn disease   . Pregnancy induced hypertension   . Preterm labor   . Heart murmur   . Urinary tract infection   . Kidney stone   . Anxiety   . Depression     No specific treatment  . PID (acute pelvic inflammatory disease)   . Chlamydia   . Gonorrhea   . Genital warts    Past Surgical History  Procedure Laterality Date  . Abortion      x2   Family History  Problem Relation Age of Onset  . Anesthesia problems Neg Hx   . Asthma Mother   . Heart disease Mother   . Diabetes Mother   . Hypertension Mother   . Cancer Father     Breast  . Asthma Sister   . Depression Sister   . Diabetes Sister   . Hypertension Sister   . Diabetes Other    History  Substance Use Topics  . Smoking status: Current Every Day Smoker -- 0.25 packs/day for 6 years    Types: Cigarettes  . Smokeless tobacco: Not on file  . Alcohol Use: Yes     Comment: 2-3 drinks/wk   OB History    Gravida Para Term Preterm AB TAB SAB Ectopic Multiple Living   4 2 1 1 2 2  0 0 0 2     Review of Systems  Constitutional: Negative for fever and chills.  HENT: Positive for dental problem. Negative for drooling.   Neurological:  Negative for speech difficulty.  Psychiatric/Behavioral: Positive for sleep disturbance.      Allergies  Celery oil; Codone; Dilaudid; and Strawberry  Home Medications   Prior to Admission medications   Medication Sig Start Date End Date Taking? Authorizing Provider  ciprofloxacin (CIPRO) 500 MG tablet Take 1 tablet (500 mg total) by mouth every 12 (twelve) hours. 03/16/14   Jennifer L Piepenbrink, PA-C  HYDROcodone-acetaminophen (NORCO/VICODIN) 5-325 MG per tablet Take 1-2 tablets by mouth every 6 (six) hours as needed for moderate pain or severe pain. 09/06/14   Montine Circle, PA-C  ibuprofen (ADVIL,MOTRIN) 200 MG tablet Take 400 mg by mouth every 6 (six) hours as needed (pain).    Historical Provider, MD  ondansetron (ZOFRAN ODT) 4 MG disintegrating tablet Take 1 tablet (4 mg total) by mouth every 8 (eight) hours as needed for nausea or vomiting. 03/16/14   Stephani Police Piepenbrink, PA-C  oxyCODONE-acetaminophen (PERCOCET/ROXICET) 5-325 MG per tablet Take 1 tablet by mouth every 4 (four) hours as needed for severe pain. May take 2 tablets PO q 6 hours for severe pain - Do not take with Tylenol as this tablet already contains tylenol 03/16/14   Anderson Malta L  Piepenbrink, PA-C  penicillin v potassium (VEETID) 500 MG tablet Take 1 tablet (500 mg total) by mouth 4 (four) times daily. 09/06/14   Montine Circle, PA-C  phenazopyridine (PYRIDIUM) 200 MG tablet Take 1 tablet (200 mg total) by mouth 3 (three) times daily. 03/16/14   Jennifer L Piepenbrink, PA-C   BP 147/100 mmHg  Pulse 81  Temp(Src) 97.9 F (36.6 C) (Oral)  Resp 18  SpO2 100% Physical Exam  Constitutional: She is oriented to person, place, and time. She appears well-developed and well-nourished.  HENT:  Head: Normocephalic and atraumatic.  Mouth/Throat:    Poor dentition throughout.  Affected tooth as diagrammed.  No signs of peritonsillar or tonsillar abscess.  No signs of gingival abscess. Oropharynx is clear and without  exudates.  Uvula is midline.  Airway is intact. No signs of Ludwig's angina with palpation of oral and sublingual mucosa.   Eyes: Conjunctivae and EOM are normal.  Neck: Normal range of motion.  Cardiovascular: Normal rate.   Pulmonary/Chest: Effort normal.  Abdominal: She exhibits no distension.  Musculoskeletal: Normal range of motion.  Neurological: She is alert and oriented to person, place, and time.  Skin: Skin is dry.  Psychiatric: She has a normal mood and affect. Her behavior is normal. Judgment and thought content normal.  Nursing note and vitals reviewed.   ED Course  Procedures (including critical care time) Labs Review Labs Reviewed - No data to display  Imaging Review No results found.   EKG Interpretation None      MDM   Final diagnoses:  Pain, dental    Patient with toothache.  No gross abscess.  Exam unconcerning for Ludwig's angina or spread of infection.  Will treat with penicillin and pain medicine.  Urged patient to follow-up with dentist.       Montine Circle, PA-C 09/06/14 Kimberly, MD 09/06/14 [phone]

## 2014-10-09 ENCOUNTER — Encounter (HOSPITAL_COMMUNITY): Payer: Self-pay | Admitting: *Deleted

## 2014-10-09 ENCOUNTER — Inpatient Hospital Stay (HOSPITAL_COMMUNITY)
Admission: AD | Admit: 2014-10-09 | Discharge: 2014-10-09 | Disposition: A | Discharge status: Home / Self Care | Payer: Self-pay | Source: Ambulatory Visit | Attending: Obstetrics | Admitting: Obstetrics

## 2014-10-09 DIAGNOSIS — R1030 Lower abdominal pain, unspecified: Secondary | ICD-10-CM

## 2014-10-09 DIAGNOSIS — W19XXXA Unspecified fall, initial encounter: Secondary | ICD-10-CM | POA: Insufficient documentation

## 2014-10-09 DIAGNOSIS — Y9341 Activity, dancing: Secondary | ICD-10-CM | POA: Insufficient documentation

## 2014-10-09 DIAGNOSIS — I10 Essential (primary) hypertension: Secondary | ICD-10-CM | POA: Insufficient documentation

## 2014-10-09 DIAGNOSIS — F17210 Nicotine dependence, cigarettes, uncomplicated: Secondary | ICD-10-CM | POA: Insufficient documentation

## 2014-10-09 DIAGNOSIS — R109 Unspecified abdominal pain: Secondary | ICD-10-CM | POA: Insufficient documentation

## 2014-10-09 DIAGNOSIS — B9689 Other specified bacterial agents as the cause of diseases classified elsewhere: Secondary | ICD-10-CM | POA: Insufficient documentation

## 2014-10-09 DIAGNOSIS — A5901 Trichomonal vulvovaginitis: Secondary | ICD-10-CM | POA: Insufficient documentation

## 2014-10-09 DIAGNOSIS — N760 Acute vaginitis: Secondary | ICD-10-CM | POA: Insufficient documentation

## 2014-10-09 LAB — CBC
HCT: 38.3 % (ref 36.0–46.0)
Hemoglobin: 12.3 g/dL (ref 12.0–15.0)
MCH: 28 pg (ref 26.0–34.0)
MCHC: 32.1 g/dL (ref 30.0–36.0)
MCV: 87 fL (ref 78.0–100.0)
PLATELETS: 274 10*3/uL (ref 150–400)
RBC: 4.4 MIL/uL (ref 3.87–5.11)
RDW: 12.7 % (ref 11.5–15.5)
WBC: 3.5 10*3/uL — ABNORMAL LOW (ref 4.0–10.5)

## 2014-10-09 LAB — URINALYSIS, ROUTINE W REFLEX MICROSCOPIC
Glucose, UA: NEGATIVE mg/dL
Ketones, ur: 15 mg/dL — AB
Leukocytes, UA: NEGATIVE
NITRITE: NEGATIVE
PH: 6 (ref 5.0–8.0)
Protein, ur: NEGATIVE mg/dL
Specific Gravity, Urine: >1.03 — ABNORMAL HIGH (ref 1.005–1.030)
UROBILINOGEN UA: 0.2 mg/dL (ref 0.0–1.0)

## 2014-10-09 LAB — WET PREP, GENITAL: YEAST WET PREP: NONE SEEN

## 2014-10-09 LAB — URINE MICROSCOPIC-ADD ON

## 2014-10-09 MED ORDER — METRONIDAZOLE 500 MG PO TABS: 500.0000 mg | ORAL_TABLET | Freq: Two times a day (BID) | ORAL | Status: DC

## 2014-10-09 MED ORDER — AMLODIPINE BESYLATE 5 MG PO TABS: 5.0000 mg | ORAL_TABLET | Freq: Every day | ORAL | Status: DC

## 2014-10-09 MED ORDER — AMLODIPINE BESYLATE 5 MG PO TABS
5.0000 mg | ORAL_TABLET | Freq: Every day | ORAL | Status: DC
  Administered 2014-10-09: 5 mg via ORAL
  Filled 2014-10-09 (×2): qty 1

## 2014-10-09 MED ORDER — DEXTROSE 5 % IN LACTATED RINGERS IV BOLUS
1000.0000 mL | Freq: Once | INTRAVENOUS | Status: AC
  Administered 2014-10-09: 1000 mL via INTRAVENOUS

## 2014-10-09 MED ORDER — KETOROLAC TROMETHAMINE 60 MG/2ML IM SOLN
60.0000 mg | Freq: Once | INTRAMUSCULAR | Status: AC
  Administered 2014-10-09: 60 mg via INTRAMUSCULAR
  Filled 2014-10-09: qty 2

## 2014-10-09 NOTE — MAU Provider Note (Signed)
History     CSN: [id number]  Arrival date and time: 10/09/14 5102   First Provider Initiated Contact with Patient 10/09/14 1920      Chief Complaint  Patient presents with  . Vaginal Bleeding   HPIpt is not pregnant- G4P2 SABwanting to get pregnant- not using anything for birth control. 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 Pt states she was in pain when she got home from work at 3:30am.  Pt's LMP was 09/26/2014 and pt started bleeding Again after the fall.  Pt denies any vaginal pain but has lower abd pain and some pressure with urination. Pt has not taken anything for pain. Pt feels nauseated and dizzy.  Pt has not had anything to drink or eat since 10:30am.  Note 10/09/2014 6:55 PM    Expand All Collapse All   Fell off pole while dancing last night. Had some spotting but now bleeding is a lot more. Last period 09/26/14. Not on any birth control. Has cramping in lower belly and back. Feels dizzy and lightheaded. Did not take anything for pain       Past Medical History  Diagnosis Date  . Crohn disease   . Pregnancy induced hypertension   . Preterm labor   . Heart murmur   . Urinary tract infection   . Kidney stone   . Anxiety   . Depression     No specific treatment  . PID (acute pelvic inflammatory disease)   . Chlamydia   . Gonorrhea   . Genital warts     Past Surgical History  Procedure Laterality Date  . Abortion      x2    Family History  Problem Relation Age of Onset  . Anesthesia problems Neg Hx   . Asthma Mother   . Heart disease Mother   . Diabetes Mother   . Hypertension Mother   . Cancer Father     Breast  . Asthma Sister   . Depression Sister   . Diabetes Sister   . Hypertension Sister   . Diabetes Other     History  Substance Use Topics  . Smoking status: Current Every Day Smoker -- 0.25 packs/day for 6 years    Types: Cigarettes  . Smokeless tobacco: Not on file   . Alcohol Use: Yes     Comment: 2-3 drinks/wk    Allergies:  Allergies  Allergen Reactions  . Celery Oil Itching  . Codone [Hydrocodone] Itching  . Dilaudid [Hydromorphone Hcl] Itching  . Strawberry Itching    Prescriptions prior to admission  Medication Sig Dispense Refill Last Dose  . ciprofloxacin (CIPRO) 500 MG tablet Take 1 tablet (500 mg total) by mouth every 12 (twelve) hours. 20 tablet 0   . HYDROcodone-acetaminophen (NORCO/VICODIN) 5-325 MG per tablet Take 1-2 tablets by mouth every 6 (six) hours as needed for moderate pain or severe pain. 10 tablet 0   . ibuprofen (ADVIL,MOTRIN) 200 MG tablet Take 400 mg by mouth every 6 (six) hours as needed (pain).   03/15/2014 at PM  . ondansetron (ZOFRAN ODT) 4 MG disintegrating tablet Take 1 tablet (4 mg total) by mouth every 8 (eight) hours as needed for nausea or vomiting. 20 tablet 0   . oxyCODONE-acetaminophen (PERCOCET/ROXICET) 5-325 MG per tablet Take 1 tablet by mouth every 4 (four) hours as needed for severe pain. May take 2 tablets PO q 6 hours for severe  pain - Do not take with Tylenol as this tablet already contains tylenol 15 tablet 0   . penicillin v potassium (VEETID) 500 MG tablet Take 1 tablet (500 mg total) by mouth 4 (four) times daily. 40 tablet 0   . phenazopyridine (PYRIDIUM) 200 MG tablet Take 1 tablet (200 mg total) by mouth 3 (three) times daily. 6 tablet 0     Review of Systems  Constitutional: Negative for fever and chills.  Gastrointestinal: Positive for nausea and abdominal pain. Negative for vomiting, diarrhea and constipation.  Genitourinary: Negative for dysuria.   Physical Exam   Blood pressure 145/105, pulse 73, temperature 98.1 F (36.7 C), temperature source Oral, resp. rate 18, height 5' 3"  (1.6 m), weight 152 lb (68.947 kg).  Physical Exam  Nursing note and vitals reviewed. Constitutional: She is oriented to person, place, and time. She appears well-developed and well-nourished. No distress.   HENT:  Head: Normocephalic.  Eyes: Pupils are equal, round, and reactive to light.  Neck: Normal range of motion. Neck supple.  Respiratory: Effort normal.  GI: Soft. She exhibits no distension. There is tenderness. There is no rebound and no guarding.  Diffuse mild lower abd tenderness with palpation- no rebound  Genitourinary:  Vaginal mucosa slightly reddenedSmall amount of dark red blood from os- no hematoma or ecchymosis seen; cervix clean, NT; uterus and adnexa mildly tender- soft; no palpable enlargement  Musculoskeletal: Normal range of motion.  Neurological: She is alert and oriented to person, place, and time.  Skin: Skin is warm and dry.  Psychiatric: She has a normal mood and affect.    MAU Course  Procedures Results for orders placed or performed during the hospital encounter of 10/09/14 (from the past 24 hour(s))  Urinalysis, Routine w reflex microscopic     Status: Abnormal   Collection Time: 10/09/14  6:45 PM  Result Value Ref Range   Color, Urine YELLOW YELLOW   APPearance CLEAR CLEAR   Specific Gravity, Urine >1.030 (H) 1.005 - 1.030   pH 6.0 5.0 - 8.0   Glucose, UA NEGATIVE NEGATIVE mg/dL   Hgb urine dipstick LARGE (A) NEGATIVE   Bilirubin Urine SMALL (A) NEGATIVE   Ketones, ur 15 (A) NEGATIVE mg/dL   Protein, ur NEGATIVE NEGATIVE mg/dL   Urobilinogen, UA 0.2 0.0 - 1.0 mg/dL   Nitrite NEGATIVE NEGATIVE   Leukocytes, UA NEGATIVE NEGATIVE  Urine microscopic-add on     Status: Abnormal   Collection Time: 10/09/14  6:45 PM  Result Value Ref Range   Squamous Epithelial / LPF FEW (A) RARE   WBC, UA 0-2 <3 WBC/hpf   RBC / HPF 3-6 <3 RBC/hpf   Bacteria, UA RARE RARE   Urine-Other MUCOUS PRESENT   Wet prep, genital     Status: Abnormal   Collection Time: 10/09/14  7:30 PM  Result Value Ref Range   Yeast Wet Prep HPF POC NONE SEEN NONE SEEN   Trich, Wet Prep FEW (A) NONE SEEN   Clue Cells Wet Prep HPF POC FEW (A) NONE SEEN   WBC, Wet Prep HPF POC FEW (A) NONE  SEEN  CBC     Status: Abnormal   Collection Time: 10/09/14  7:50 PM  Result Value Ref Range   WBC 3.5 (L) 4.0 - 10.5 K/uL   RBC 4.40 3.87 - 5.11 MIL/uL   Hemoglobin 12.3 12.0 - 15.0 g/dL   HCT 38.3 36.0 - 46.0 %   MCV 87.0 78.0 - 100.0 fL   MCH 28.0  26.0 - 34.0 pg   MCHC 32.1 30.0 - 36.0 g/dL   RDW 12.7 11.5 - 15.5 %   Platelets 274 150 - 400 K/uL  GC and chlamydia pending Toradol 54m IM given D5LR IV given 1 liter Norvasc 5 mg given Pt felt much better after Toradol and IVF; pt tolerated Norvasc without any side effects Discussed importance of blood pressure control Pt does not have insurance at present and does not have physician Assessment and Plan  Abdominal trauma- pt to return if increase bleeding or pain Trichomonas vaginitis- flagyl 5047mBID for 7 days for both Trich and BV; partner to be treated Bacterial vaginosis Hypertension- Norvasc 53m86m daily- start tomorrow afternoon then 2nd day in the morning and take daily F/u with [facility] to call for an appointment [facility]/2016, 7:34 PM

## 2014-10-09 NOTE — MAU Note (Signed)
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  Had some spotting but now bleeding is a lot more.  Last period 09/26/14.  Not on any birth control.  Has cramping in lower belly and back.  Feels dizzy and lightheaded. Did not take anything for pain.

## 2014-10-11 LAB — GC/CHLAMYDIA PROBE AMP (~~LOC~~) NOT AT ARMC
CHLAMYDIA, DNA PROBE: NEGATIVE
NEISSERIA GONORRHEA: NEGATIVE

## 2014-12-01 ENCOUNTER — Inpatient Hospital Stay (HOSPITAL_COMMUNITY)
Admission: AD | Admit: 2014-12-01 | Discharge: 2014-12-01 | Disposition: A | Discharge status: Home / Self Care | Payer: Self-pay | Source: Ambulatory Visit | Attending: Obstetrics & Gynecology | Admitting: Obstetrics & Gynecology

## 2014-12-01 ENCOUNTER — Encounter (HOSPITAL_COMMUNITY): Payer: Self-pay

## 2014-12-01 DIAGNOSIS — F17210 Nicotine dependence, cigarettes, uncomplicated: Secondary | ICD-10-CM | POA: Insufficient documentation

## 2014-12-01 DIAGNOSIS — R110 Nausea: Secondary | ICD-10-CM | POA: Insufficient documentation

## 2014-12-01 DIAGNOSIS — R1030 Lower abdominal pain, unspecified: Secondary | ICD-10-CM | POA: Insufficient documentation

## 2014-12-01 DIAGNOSIS — N760 Acute vaginitis: Secondary | ICD-10-CM | POA: Insufficient documentation

## 2014-12-01 DIAGNOSIS — I10 Essential (primary) hypertension: Secondary | ICD-10-CM | POA: Insufficient documentation

## 2014-12-01 DIAGNOSIS — R102 Pelvic and perineal pain: Secondary | ICD-10-CM | POA: Insufficient documentation

## 2014-12-01 DIAGNOSIS — B9689 Other specified bacterial agents as the cause of diseases classified elsewhere: Secondary | ICD-10-CM | POA: Insufficient documentation

## 2014-12-01 DIAGNOSIS — A499 Bacterial infection, unspecified: Secondary | ICD-10-CM

## 2014-12-01 LAB — CBC
HEMATOCRIT: 35.9 % — AB (ref 36.0–46.0)
HEMOGLOBIN: 11.8 g/dL — AB (ref 12.0–15.0)
MCH: 28.5 pg (ref 26.0–34.0)
MCHC: 32.9 g/dL (ref 30.0–36.0)
MCV: 86.7 fL (ref 78.0–100.0)
Platelets: 255 10*3/uL (ref 150–400)
RBC: 4.14 MIL/uL (ref 3.87–5.11)
RDW: 13.5 % (ref 11.5–15.5)
WBC: 4.6 10*3/uL (ref 4.0–10.5)

## 2014-12-01 LAB — WET PREP, GENITAL
Trich, Wet Prep: NONE SEEN
Yeast Wet Prep HPF POC: NONE SEEN

## 2014-12-01 LAB — RAPID URINE DRUG SCREEN, HOSP PERFORMED
Amphetamines: NOT DETECTED
BARBITURATES: NOT DETECTED
BENZODIAZEPINES: NOT DETECTED
Cocaine: POSITIVE — AB
OPIATES: NOT DETECTED
Tetrahydrocannabinol: POSITIVE — AB

## 2014-12-01 LAB — URINALYSIS, ROUTINE W REFLEX MICROSCOPIC
Bilirubin Urine: NEGATIVE
Glucose, UA: NEGATIVE mg/dL
Hgb urine dipstick: NEGATIVE
Ketones, ur: NEGATIVE mg/dL
LEUKOCYTES UA: NEGATIVE
NITRITE: NEGATIVE
Protein, ur: NEGATIVE mg/dL
Urobilinogen, UA: 0.2 mg/dL (ref 0.0–1.0)
pH: 5.5 (ref 5.0–8.0)

## 2014-12-01 LAB — HCG, QUANTITATIVE, PREGNANCY: HCG, BETA CHAIN, QUANT, S: 2 m[IU]/mL (ref <5)

## 2014-12-01 MED ORDER — PROMETHAZINE HCL 25 MG PO TABS: 25.0000 mg | ORAL_TABLET | Freq: Four times a day (QID) | ORAL | Status: DC | PRN

## 2014-12-01 MED ORDER — METRONIDAZOLE 500 MG PO TABS: 500.0000 mg | ORAL_TABLET | Freq: Two times a day (BID) | ORAL | Status: AC

## 2014-12-01 NOTE — MAU Note (Signed)
Was given BP medicine the last time she was here. First said was taking it, then said taking it every other day because she didn't like the way it made her feel.  Has not followed up with a dr, doesn't have money.  Explained the risks of hyptension,- says "I feel fine", explained that is why they call it the silent killer, this is serious and she needs to get it under control.

## 2014-12-01 NOTE — MAU Provider Note (Signed)
History     CSN: [id number]  Arrival date and time: 12/01/14 1607   First Provider Initiated Contact with Patient 12/01/14 1703      Chief Complaint  Patient presents with  . Emesis  . Abdominal Pain  . Possible Pregnancy   Emesis  This is a new problem. The current episode started 1 to 4 weeks ago. The problem occurs less than 2 times per day. The problem has been gradually improving. There has been no fever (99 degrees). Associated symptoms include abdominal pain and chills. Pertinent negatives include no chest pain, coughing, diarrhea (only one stool per day), dizziness, fever, headaches or myalgias. She has tried nothing for the symptoms.   This is a 29 y.o. female who presents with c/o vomiting, chills, fever and lower abdominal pain for 2 weeks. States has one loose stool per day. Had a + pregnancy test Sunday but neg on Monday. Denies bleeding is a few days late for cycle.    RN note: Started 2-3 wks ago.Vomiting, low grade fever (highest 99), chills. Started having pain in lower abd. Stopped eating and sleeping. +HPT on Sunday, Mon had neg HPT. Still having nausea and some vomiting Having at least one loose stool a day.          OB History    Gravida Para Term Preterm AB TAB SAB Ectopic Multiple Living   4 2 1 1 2 2  0 0 0 2      Past Medical History  Diagnosis Date  . Crohn disease   . Pregnancy induced hypertension   . Preterm labor   . Heart murmur   . Urinary tract infection   . Kidney stone   . Anxiety   . Depression     No specific treatment  . PID (acute pelvic inflammatory disease)   . Chlamydia   . Gonorrhea   . Genital warts     Past Surgical History  Procedure Laterality Date  . Abortion      x2    Family History  Problem Relation Age of Onset  . Anesthesia problems Neg Hx   . Asthma Mother   . Heart disease Mother   . Diabetes Mother   . Hypertension Mother   . Cancer Father     Breast  . Asthma Sister   . Depression Sister    . Diabetes Sister   . Hypertension Sister   . Diabetes Other     History  Substance Use Topics  . Smoking status: Current Every Day Smoker -- 0.25 packs/day for 6 years    Types: Cigarettes  . Smokeless tobacco: Not on file  . Alcohol Use: Yes     Comment: 2-3 drinks/wk    Allergies:  Allergies  Allergen Reactions  . Celery Oil Itching  . Codone [Hydrocodone] Itching  . Dilaudid [Hydromorphone Hcl] Itching  . Strawberry Itching    Prescriptions prior to admission  Medication Sig Dispense Refill Last Dose  . amLODipine (NORVASC) 5 MG tablet Take 1 tablet (5 mg total) by mouth daily. 30 tablet 0 Past Week at Unknown time  . ibuprofen (ADVIL,MOTRIN) 200 MG tablet Take 400 mg by mouth every 6 (six) hours as needed (pain).   11/30/2014 at Unknown time  . ondansetron (ZOFRAN ODT) 4 MG disintegrating tablet Take 1 tablet (4 mg total) by mouth every 8 (eight) hours as needed for nausea or vomiting. 20 tablet 0 Past Week at Unknown time  . HYDROcodone-acetaminophen (NORCO/VICODIN) 5-325 MG per tablet  Take 1-2 tablets by mouth every 6 (six) hours as needed for moderate pain or severe pain. (Patient not taking: Reported on 12/01/2014) 10 tablet 0 Not Taking at Unknown time  . metroNIDAZOLE (FLAGYL) 500 MG tablet Take 1 tablet (500 mg total) by mouth 2 (two) times daily. (Patient not taking: Reported on 12/01/2014) 14 tablet 0 Completed Course at Unknown time    Review of Systems  Constitutional: Positive for chills and malaise/fatigue. Negative for fever.  Respiratory: Negative for cough.   Cardiovascular: Negative for chest pain.  Gastrointestinal: Positive for nausea, vomiting and abdominal pain. Negative for diarrhea (only one stool per day) and constipation.  Genitourinary: Negative for dysuria.  Musculoskeletal: Negative for myalgias.  Neurological: Negative for dizziness, focal weakness and headaches.   Physical Exam   Blood pressure 124/77, pulse 77, temperature 97.9 F (36.6  C), temperature source Oral, resp. rate 20, height 5' 4"  (1.626 m), weight 148 lb (67.132 kg), last menstrual period 10/27/2014.  Physical Exam  Constitutional: She is oriented to person, place, and time. She appears well-developed and well-nourished. No distress.  HENT:  Head: Normocephalic.  Cardiovascular: Normal rate, regular rhythm and normal heart sounds.  Exam reveals no gallop and no friction rub.   No murmur heard. Respiratory: Effort normal and breath sounds normal. No respiratory distress. She has no wheezes. She has no rales.  GI: Soft. She exhibits no distension and no mass. There is tenderness (mild diffuse tenderness). There is no rebound and no guarding.  Genitourinary: Vagina normal. No vaginal discharge found.  Cervix closed Uterus small and nontender Adnexae nontender   Musculoskeletal: Normal range of motion.  Neurological: She is alert and oriented to person, place, and time.  Skin: Skin is warm and dry.  Psychiatric: She has a normal mood and affect.    MAU Course  Procedures  MDM Pelvic exam and cultures Quant HCG  Results for orders placed or performed during the hospital encounter of 12/01/14 (from the past 72 hour(s))  GC/Chlamydia probe amp ([place])     Status: None   Collection Time: 12/01/14 12:00 AM  Result Value Ref Range   Chlamydia Negative     Comment: Normal Reference Range - Negative   Neisseria gonorrhea Negative     Comment: Normal Reference Range - Negative  Urinalysis, Routine w reflex microscopic     Status: Abnormal   Collection Time: 12/01/14  4:40 PM  Result Value Ref Range   Color, Urine YELLOW YELLOW   APPearance CLEAR CLEAR   Specific Gravity, Urine >1.030 (H) 1.005 - 1.030   pH 5.5 5.0 - 8.0   Glucose, UA NEGATIVE NEGATIVE mg/dL   Hgb urine dipstick NEGATIVE NEGATIVE   Bilirubin Urine NEGATIVE NEGATIVE   Ketones, ur NEGATIVE NEGATIVE mg/dL   Protein, ur NEGATIVE NEGATIVE mg/dL   Urobilinogen, UA 0.2 0.0 - 1.0 mg/dL    Nitrite NEGATIVE NEGATIVE   Leukocytes, UA NEGATIVE NEGATIVE    Comment: MICROSCOPIC NOT DONE ON URINES WITH NEGATIVE PROTEIN, BLOOD, LEUKOCYTES, NITRITE, OR GLUCOSE <1000 mg/dL.  Urine rapid drug screen (hosp performed)     Status: Abnormal   Collection Time: 12/01/14  4:40 PM  Result Value Ref Range   Opiates NONE DETECTED NONE DETECTED   Cocaine POSITIVE (A) NONE DETECTED   Benzodiazepines NONE DETECTED NONE DETECTED   Amphetamines NONE DETECTED NONE DETECTED   Tetrahydrocannabinol POSITIVE (A) NONE DETECTED   Barbiturates NONE DETECTED NONE DETECTED    Comment:  DRUG SCREEN FOR MEDICAL PURPOSES ONLY.  IF CONFIRMATION IS NEEDED FOR ANY PURPOSE, NOTIFY LAB WITHIN 5 DAYS.        LOWEST DETECTABLE LIMITS FOR URINE DRUG SCREEN Drug Class       Cutoff (ng/mL) Amphetamine      1000 Barbiturate      200 Benzodiazepine   580 Tricyclics       998 Opiates          300 Cocaine          300 THC              50   CBC     Status: Abnormal   Collection Time: 12/01/14  5:15 PM  Result Value Ref Range   WBC 4.6 4.0 - 10.5 K/uL   RBC 4.14 3.87 - 5.11 MIL/uL   Hemoglobin 11.8 (L) 12.0 - 15.0 g/dL   HCT 35.9 (L) 36.0 - 46.0 %   MCV 86.7 78.0 - 100.0 fL   MCH 28.5 26.0 - 34.0 pg   MCHC 32.9 30.0 - 36.0 g/dL   RDW 13.5 11.5 - 15.5 %   Platelets 255 150 - 400 K/uL  hCG, quantitative, pregnancy     Status: None   Collection Time: 12/01/14  5:15 PM  Result Value Ref Range   hCG, Beta Chain, Quant, S 2 <5 mIU/mL    Comment:          GEST. AGE      CONC.  (mIU/mL)   <=1 WEEK        5 - 50     2 WEEKS       50 - 500     3 WEEKS       100 - 10,000     4 WEEKS     1,000 - 30,000     5 WEEKS     3,500 - 115,000   6-8 WEEKS     12,000 - 270,000    12 WEEKS     15,000 - 220,000        FEMALE AND NON-PREGNANT FEMALE:     LESS THAN 5 mIU/mL   HIV antibody     Status: None   Collection Time: 12/01/14  5:15 PM  Result Value Ref Range   HIV Screen 4th Generation wRfx Non Reactive Non  Reactive    Comment: (NOTE) Performed At: [facility] [place], [place] [id number] Lindon Romp MD [id number]   Wet prep, genital     Status: Abnormal   Collection Time: 12/01/14  5:20 PM  Result Value Ref Range   Yeast Wet Prep HPF POC NONE SEEN NONE SEEN   Trich, Wet Prep NONE SEEN NONE SEEN   Clue Cells Wet Prep HPF POC MODERATE (A) NONE SEEN   WBC, Wet Prep HPF POC FEW (A) NONE SEEN    Comment: MANY BACTERIA SEEN     Assessment and Plan  A;  Not pregnant      Pelvic pain may be dysmenorrhea of menses getting ready to start      Possible gastroenteritis, though no classic diarrhea pattern      Nausea of uncertain etiology      BV      Hypertension  P:  Discussed findings      Discharge horme      Rx Flagyl and antiemetic      Recommend go to family doctor for further eval of ongoing nausea and hypertension,  discussed dangers of untreated hypertension  Danely Bayliss 12/01/2014, 7:22 PM

## 2014-12-01 NOTE — Discharge Instructions (Signed)
Abdominal Pain, Women Abdominal (stomach, pelvic, or belly) pain can be caused by many things. It is important to tell your doctor:  The location of the pain.  Does it come and go or is it present all the time?  Are there things that start the pain (eating certain foods, exercise)?  Are there other symptoms associated with the pain (fever, nausea, vomiting, diarrhea)? All of this is helpful to know when trying to find the cause of the pain. CAUSES   Stomach: virus or bacteria infection, or ulcer.  Intestine: appendicitis (inflamed appendix), regional ileitis (Crohn's disease), ulcerative colitis (inflamed colon), irritable bowel syndrome, diverticulitis (inflamed diverticulum of the colon), or cancer of the stomach or intestine.  Gallbladder disease or stones in the gallbladder.  Kidney disease, kidney stones, or infection.  Pancreas infection or cancer.  Fibromyalgia (pain disorder).  Diseases of the female organs:  Uterus: fibroid (non-cancerous) tumors or infection.  Fallopian tubes: infection or tubal pregnancy.  Ovary: cysts or tumors.  Pelvic adhesions (scar tissue).  Endometriosis (uterus lining tissue growing in the pelvis and on the pelvic organs).  Pelvic congestion syndrome (female organs filling up with blood just before the menstrual period).  Pain with the menstrual period.  Pain with ovulation (producing an egg).  Pain with an IUD (intrauterine device, birth control) in the uterus.  Cancer of the female organs.  Functional pain (pain not caused by a disease, may improve without treatment).  Psychological pain.  Depression. DIAGNOSIS  Your doctor will decide the seriousness of your pain by doing an examination.  Blood tests.  X-rays.  Ultrasound.  CT scan (computed tomography, special type of X-ray).  MRI (magnetic resonance imaging).  Cultures, for infection.  Barium enema (dye inserted in the large intestine, to better view it with  X-rays).  Colonoscopy (looking in intestine with a lighted tube).  Laparoscopy (minor surgery, looking in abdomen with a lighted tube).  Major abdominal exploratory surgery (looking in abdomen with a large incision). TREATMENT  The treatment will depend on the cause of the pain.   Many cases can be observed and treated at home.  Over-the-counter medicines recommended by your caregiver.  Prescription medicine.  Antibiotics, for infection.  Birth control pills, for painful periods or for ovulation pain.  Hormone treatment, for endometriosis.  Nerve blocking injections.  Physical therapy.  Antidepressants.  Counseling with a psychologist or psychiatrist.  Minor or major surgery. HOME CARE INSTRUCTIONS   Do not take laxatives, unless directed by your caregiver.  Take over-the-counter pain medicine only if ordered by your caregiver. Do not take aspirin because it can cause an upset stomach or bleeding.  Try a clear liquid diet (broth or water) as ordered by your caregiver. Slowly move to a bland diet, as tolerated, if the pain is related to the stomach or intestine.  Have a thermometer and take your temperature several times a day, and record it.  Bed rest and sleep, if it helps the pain.  Avoid sexual intercourse, if it causes pain.  Avoid stressful situations.  Keep your follow-up appointments and tests, as your caregiver orders.  If the pain does not go away with medicine or surgery, you may try:  Acupuncture.  Relaxation exercises (yoga, meditation).  Group therapy.  Counseling. SEEK MEDICAL CARE IF:   You notice certain foods cause stomach pain.  Your home care treatment is not helping your pain.  You need stronger pain medicine.  You want your IUD removed.  You feel faint or  lightheaded.  You develop nausea and vomiting.  You develop a rash.  You are having side effects or an allergy to your medicine. SEEK IMMEDIATE MEDICAL CARE IF:   Your  pain does not go away or gets worse.  You have a fever.  Your pain is felt only in portions of the abdomen. The right side could possibly be appendicitis. The left lower portion of the abdomen could be colitis or diverticulitis.  You are passing blood in your stools (bright red or black tarry stools, with or without vomiting).  You have blood in your urine.  You develop chills, with or without a fever.  You pass out. MAKE SURE YOU:   Understand these instructions.  Will watch your condition.  Will get help right away if you are not doing well or get worse. Document Released: 05/06/2007 Document Revised: 11/23/2013 Document Reviewed: 05/26/2009 [facility] Patient Information 2015 [place], [place]. This information is not intended to replace advice given to you by your health care provider. Make sure you discuss any questions you have with your health care provider.  Hypertension Hypertension, commonly called high blood pressure, is when the force of blood pumping through your arteries is too strong. Your arteries are the blood vessels that carry blood from your heart throughout your body. A blood pressure reading consists of a higher number over a lower number, such as 110/72. The higher number (systolic) is the pressure inside your arteries when your heart pumps. The lower number (diastolic) is the pressure inside your arteries when your heart relaxes. Ideally you want your blood pressure below 120/80. Hypertension forces your heart to work harder to pump blood. Your arteries may become narrow or stiff. Having hypertension puts you at risk for heart disease, stroke, and other problems.  RISK FACTORS Some risk factors for high blood pressure are controllable. Others are not.  Risk factors you cannot control include:   Race. You may be at higher risk if you are African American.  Age. Risk increases with age.  Gender. Men are at higher risk than women before age 69 years. After age 65,  women are at higher risk than men. Risk factors you can control include:  Not getting enough exercise or physical activity.  Being overweight.  Getting too much fat, sugar, calories, or salt in your diet.  Drinking too much alcohol. SIGNS AND SYMPTOMS Hypertension does not usually cause signs or symptoms. Extremely high blood pressure (hypertensive crisis) may cause headache, anxiety, shortness of breath, and nosebleed. DIAGNOSIS  To check if you have hypertension, your health care provider will measure your blood pressure while you are seated, with your arm held at the level of your heart. It should be measured at least twice using the same arm. Certain conditions can cause a difference in blood pressure between your right and left arms. A blood pressure reading that is higher than normal on one occasion does not mean that you need treatment. If one blood pressure reading is high, ask your health care provider about having it checked again. TREATMENT  Treating high blood pressure includes making lifestyle changes and possibly taking medicine. Living a healthy lifestyle can help lower high blood pressure. You may need to change some of your habits. Lifestyle changes may include:  Following the DASH diet. This diet is high in fruits, vegetables, and whole grains. It is low in salt, red meat, and added sugars.  Getting at least 2 hours of brisk physical activity every week.  Losing weight  if necessary.  Not smoking.  Limiting alcoholic beverages.  Learning ways to reduce stress. If lifestyle changes are not enough to get your blood pressure under control, your health care provider may prescribe medicine. You may need to take more than one. Work closely with your health care provider to understand the risks and benefits. HOME CARE INSTRUCTIONS  Have your blood pressure rechecked as directed by your health care provider.   Take medicines only as directed by your health care provider.  Follow the directions carefully. Blood pressure medicines must be taken as prescribed. The medicine does not work as well when you skip doses. Skipping doses also puts you at risk for problems.   Do not smoke.   Monitor your blood pressure at home as directed by your health care provider. SEEK MEDICAL CARE IF:   You think you are having a reaction to medicines taken.  You have recurrent headaches or feel dizzy.  You have swelling in your ankles.  You have trouble with your vision. SEEK IMMEDIATE MEDICAL CARE IF:  You develop a severe headache or confusion.  You have unusual weakness, numbness, or feel faint.  You have severe chest or abdominal pain.  You vomit repeatedly.  You have trouble breathing. MAKE SURE YOU:   Understand these instructions.  Will watch your condition.  Will get help right away if you are not doing well or get worse. Document Released: 07/09/2005 Document Revised: 11/23/2013 Document Reviewed: 05/01/2013 [facility] Patient Information 2015 [place], [place]. This information is not intended to replace advice given to you by your health care provider. Make sure you discuss any questions you have with your health care provider. Bacterial Vaginosis Bacterial vaginosis is a vaginal infection that occurs when the normal balance of bacteria in the vagina is disrupted. It results from an overgrowth of certain bacteria. This is the most common vaginal infection in women of childbearing age. Treatment is important to prevent complications, especially in pregnant women, as it can cause a premature delivery. CAUSES  Bacterial vaginosis is caused by an increase in harmful bacteria that are normally present in smaller amounts in the vagina. Several different kinds of bacteria can cause bacterial vaginosis. However, the reason that the condition develops is not fully understood. RISK FACTORS Certain activities or behaviors can put you at an increased risk of developing  bacterial vaginosis, including:  Having a new sex partner or multiple sex partners.  Douching.  Using an intrauterine device (IUD) for contraception. Women do not get bacterial vaginosis from toilet seats, bedding, swimming pools, or contact with objects around them. SIGNS AND SYMPTOMS  Some women with bacterial vaginosis have no signs or symptoms. Common symptoms include:  Grey vaginal discharge.  A fishlike odor with discharge, especially after sexual intercourse.  Itching or burning of the vagina and vulva.  Burning or pain with urination. DIAGNOSIS  Your health care provider will take a medical history and examine the vagina for signs of bacterial vaginosis. A sample of vaginal fluid may be taken. Your health care provider will look at this sample under a microscope to check for bacteria and abnormal cells. A vaginal pH test may also be done.  TREATMENT  Bacterial vaginosis may be treated with antibiotic medicines. These may be given in the form of a pill or a vaginal cream. A second round of antibiotics may be prescribed if the condition comes back after treatment.  HOME CARE INSTRUCTIONS   Only take over-the-counter or prescription medicines as directed by your  health care provider.  If antibiotic medicine was prescribed, take it as directed. Make sure you finish it even if you start to feel better.  Do not have sex until treatment is completed.  Tell all sexual partners that you have a vaginal infection. They should see their health care provider and be treated if they have problems, such as a mild rash or itching.  Practice safe sex by using condoms and only having one sex partner. SEEK MEDICAL CARE IF:   Your symptoms are not improving after 3 days of treatment.  You have increased discharge or pain.  You have a fever. MAKE SURE YOU:   Understand these instructions.  Will watch your condition.  Will get help right away if you are not doing well or get  worse. FOR MORE INFORMATION  Centers for Disease Control and Prevention, Division of STD Prevention: [employer] American Sexual Health Association (ASHA): www.ashastd.org  Document Released: 07/09/2005 Document Revised: 04/29/2013 Document Reviewed: 02/18/2013 [facility] Patient Information 2015 [place], [place]. This information is not intended to replace advice given to you by your health care provider. Make sure you discuss any questions you have with your health care provider.

## 2014-12-01 NOTE — MAU Note (Addendum)
Started 2-3 wks ago.Vomiting, low grade fever (highest 99), chills.  Started having pain in lower abd. Stopped eating and sleeping.  +HPT on Sunday, Mon had neg HPT.  Still having nausea and some vomiting  Having at least one loose stool a day.

## 2014-12-02 LAB — GC/CHLAMYDIA PROBE AMP (~~LOC~~) NOT AT ARMC
CHLAMYDIA, DNA PROBE: NEGATIVE
Neisseria Gonorrhea: NEGATIVE

## 2014-12-02 LAB — HIV ANTIBODY (ROUTINE TESTING W REFLEX): HIV Screen 4th Generation wRfx: NONREACTIVE

## 2015-01-26 DIAGNOSIS — Z79899 Other long term (current) drug therapy: Secondary | ICD-10-CM | POA: Insufficient documentation

## 2015-01-26 DIAGNOSIS — Z8619 Personal history of other infectious and parasitic diseases: Secondary | ICD-10-CM | POA: Insufficient documentation

## 2015-01-26 DIAGNOSIS — Z720 Tobacco use: Secondary | ICD-10-CM | POA: Insufficient documentation

## 2015-01-26 DIAGNOSIS — Z87440 Personal history of urinary (tract) infections: Secondary | ICD-10-CM | POA: Insufficient documentation

## 2015-01-26 DIAGNOSIS — Z87442 Personal history of urinary calculi: Secondary | ICD-10-CM | POA: Insufficient documentation

## 2015-01-26 DIAGNOSIS — Z8659 Personal history of other mental and behavioral disorders: Secondary | ICD-10-CM | POA: Insufficient documentation

## 2015-01-26 DIAGNOSIS — R011 Cardiac murmur, unspecified: Secondary | ICD-10-CM | POA: Insufficient documentation

## 2015-01-26 DIAGNOSIS — Z8742 Personal history of other diseases of the female genital tract: Secondary | ICD-10-CM | POA: Insufficient documentation

## 2015-01-26 DIAGNOSIS — Z8719 Personal history of other diseases of the digestive system: Secondary | ICD-10-CM | POA: Insufficient documentation

## 2015-01-26 DIAGNOSIS — J019 Acute sinusitis, unspecified: Secondary | ICD-10-CM | POA: Insufficient documentation

## 2015-01-26 DIAGNOSIS — J029 Acute pharyngitis, unspecified: Secondary | ICD-10-CM | POA: Insufficient documentation

## 2015-01-27 ENCOUNTER — Encounter (HOSPITAL_COMMUNITY): Payer: Self-pay

## 2015-01-27 ENCOUNTER — Emergency Department (HOSPITAL_COMMUNITY): Payer: Self-pay

## 2015-01-27 ENCOUNTER — Emergency Department (HOSPITAL_COMMUNITY)
Admission: EM | Admit: 2015-01-27 | Discharge: 2015-01-27 | Disposition: A | Discharge status: Home / Self Care | Payer: Self-pay | Attending: Emergency Medicine | Admitting: Emergency Medicine

## 2015-01-27 DIAGNOSIS — R51 Headache: Secondary | ICD-10-CM

## 2015-01-27 DIAGNOSIS — R519 Headache, unspecified: Secondary | ICD-10-CM

## 2015-01-27 DIAGNOSIS — R05 Cough: Secondary | ICD-10-CM

## 2015-01-27 DIAGNOSIS — R059 Cough, unspecified: Secondary | ICD-10-CM

## 2015-01-27 DIAGNOSIS — J019 Acute sinusitis, unspecified: Secondary | ICD-10-CM

## 2015-01-27 LAB — I-STAT BETA HCG BLOOD, ED (MC, WL, AP ONLY): I-stat hCG, quantitative: <5 m[IU]/mL (ref <5)

## 2015-01-27 LAB — RAPID STREP SCREEN (MED CTR MEBANE ONLY): Streptococcus, Group A Screen (Direct): NEGATIVE

## 2015-01-27 MED ORDER — SALINE SPRAY 0.65 % NA SOLN: 1.0000 | NASAL | Status: DC | PRN

## 2015-01-27 MED ORDER — KETOROLAC TROMETHAMINE 60 MG/2ML IM SOLN
60.0000 mg | Freq: Once | INTRAMUSCULAR | Status: AC
  Administered 2015-01-27: 60 mg via INTRAMUSCULAR
  Filled 2015-01-27: qty 2

## 2015-01-27 MED ORDER — LORATADINE 10 MG PO TABS
10.0000 mg | ORAL_TABLET | Freq: Once | ORAL | Status: AC
  Administered 2015-01-27: 10 mg via ORAL
  Filled 2015-01-27: qty 1

## 2015-01-27 MED ORDER — METOCLOPRAMIDE HCL 5 MG/ML IJ SOLN
10.0000 mg | Freq: Once | INTRAMUSCULAR | Status: AC
  Administered 2015-01-27: 10 mg via INTRAMUSCULAR
  Filled 2015-01-27: qty 2

## 2015-01-27 MED ORDER — OXYMETAZOLINE HCL 0.05 % NA SOLN
1.0000 | Freq: Once | NASAL | Status: AC
  Administered 2015-01-27: 1 via NASAL
  Filled 2015-01-27: qty 15

## 2015-01-27 MED ORDER — IBUPROFEN 600 MG PO TABS: 600.0000 mg | ORAL_TABLET | Freq: Four times a day (QID) | ORAL | Status: DC | PRN

## 2015-01-27 MED ORDER — CETIRIZINE-PSEUDOEPHEDRINE ER 5-120 MG PO TB12: 1.0000 | ORAL_TABLET | Freq: Two times a day (BID) | ORAL | Status: DC

## 2015-01-27 NOTE — Discharge Instructions (Signed)
You may use Afrin, 2 sprays in each nostril daily, for an additional 2 days. Do not use longer then 2 days as this may cause worsening congestion, rather than improved symptoms. Use Ocean saline spray for any nasal congestion that persists throughout the day. Take ibuprofen for pain control and Zyrtec D for congestion. Be sure to drink plenty of fluids. He may also try drinking hot tea. Get plenty of rest. Follow-up with a primary care doctor for recheck of symptoms.  Sinusitis Sinusitis is redness, soreness, and inflammation of the paranasal sinuses. Paranasal sinuses are air pockets within the bones of your face (beneath the eyes, the middle of the forehead, or above the eyes). In healthy paranasal sinuses, mucus is able to drain out, and air is able to circulate through them by way of your nose. However, when your paranasal sinuses are inflamed, mucus and air can become trapped. This can allow bacteria and other germs to grow and cause infection. Sinusitis can develop quickly and last only a short time (acute) or continue over a long period (chronic). Sinusitis that lasts for more than 12 weeks is considered chronic.  CAUSES  Causes of sinusitis include:  Allergies.  Structural abnormalities, such as displacement of the cartilage that separates your nostrils (deviated septum), which can decrease the air flow through your nose and sinuses and affect sinus drainage.  Functional abnormalities, such as when the small hairs (cilia) that line your sinuses and help remove mucus do not work properly or are not present. SIGNS AND SYMPTOMS  Symptoms of acute and chronic sinusitis are the same. The primary symptoms are pain and pressure around the affected sinuses. Other symptoms include:  Upper toothache.  Earache.  Headache.  Bad breath.  Decreased sense of smell and taste.  A cough, which worsens when you are lying flat.  Fatigue.  Fever.  Thick drainage from your nose, which often is  green and may contain pus (purulent).  Swelling and warmth over the affected sinuses. DIAGNOSIS  Your health care provider will perform a physical exam. During the exam, your health care provider may:  Look in your nose for signs of abnormal growths in your nostrils (nasal polyps).  Tap over the affected sinus to check for signs of infection.  View the inside of your sinuses (endoscopy) using an imaging device that has a light attached (endoscope). If your health care provider suspects that you have chronic sinusitis, one or more of the following tests may be recommended:  Allergy tests.  Nasal culture. A sample of mucus is taken from your nose, sent to a lab, and screened for bacteria.  Nasal cytology. A sample of mucus is taken from your nose and examined by your health care provider to determine if your sinusitis is related to an allergy. TREATMENT  Most cases of acute sinusitis are related to a viral infection and will resolve on their own within 10 days. Sometimes medicines are prescribed to help relieve symptoms (pain medicine, decongestants, nasal steroid sprays, or saline sprays).  However, for sinusitis related to a bacterial infection, your health care provider will prescribe antibiotic medicines. These are medicines that will help kill the bacteria causing the infection.  Rarely, sinusitis is caused by a fungal infection. In theses cases, your health care provider will prescribe antifungal medicine. For some cases of chronic sinusitis, surgery is needed. Generally, these are cases in which sinusitis recurs more than 3 times per year, despite other treatments. HOME CARE INSTRUCTIONS   Drink plenty  of water. Water helps thin the mucus so your sinuses can drain more easily.  Use a humidifier.  Inhale steam 3 to 4 times a day (for example, sit in the bathroom with the shower running).  Apply a warm, moist washcloth to your face 3 to 4 times a day, or as directed by your health  care provider.  Use saline nasal sprays to help moisten and clean your sinuses.  Take medicines only as directed by your health care provider.  If you were prescribed either an antibiotic or antifungal medicine, finish it all even if you start to feel better. SEEK IMMEDIATE MEDICAL CARE IF:  You have increasing pain or severe headaches.  You have nausea, vomiting, or drowsiness.  You have swelling around your face.  You have vision problems.  You have a stiff neck.  You have difficulty breathing. MAKE SURE YOU:   Understand these instructions.  Will watch your condition.  Will get help right away if you are not doing well or get worse. Document Released: 07/09/2005 Document Revised: 11/23/2013 Document Reviewed: 07/24/2011 [facility] Patient Information 2015 [place], [place]. This information is not intended to replace advice given to you by your health care provider. Make sure you discuss any questions you have with your health care provider.

## 2015-01-27 NOTE — ED Provider Notes (Signed)
CSN: [id number]     Arrival date & time 01/26/15  2355 History   First MD Initiated Contact with Patient 01/27/15 0036     Chief Complaint  Patient presents with  . Generalized Body Aches    (Consider location/radiation/quality/duration/timing/severity/associated sxs/prior Treatment) HPI Comments: 29 year old female presents to the emergency department for further evaluation of multiple complaints. Patient states that she has had 2 days of multiple symptoms. Symptoms began as a cough with rhinorrhea. She reports that the cough is productive of green phlegm. It is sporadic and worsened with deep breathing. Patient subsequently developed body aches as well as nasal congestion and pressure in her right ear. She also complains of sore throat as well as chills and a right-sided headache which is also intermittent. Patient reports feeling short of breath at times. She states that her godson is currently taking amoxicillin for an upper respiratory infection. Patient denies taking any medications for her symptoms. She has had no syncope, inability to swallow, drooling, vomiting, or diarrhea.  The history is provided by the patient. No language interpreter was used.    Past Medical History  Diagnosis Date  . Crohn disease   . Pregnancy induced hypertension   . Preterm labor   . Heart murmur   . Urinary tract infection   . Kidney stone   . Anxiety   . Depression     No specific treatment  . PID (acute pelvic inflammatory disease)   . Chlamydia   . Gonorrhea   . Genital warts    Past Surgical History  Procedure Laterality Date  . Abortion      x2   Family History  Problem Relation Age of Onset  . Anesthesia problems Neg Hx   . Asthma Mother   . Heart disease Mother   . Diabetes Mother   . Hypertension Mother   . Cancer Father     Breast  . Asthma Sister   . Depression Sister   . Diabetes Sister   . Hypertension Sister   . Diabetes Other    History  Substance Use Topics  .  Smoking status: Current Every Day Smoker -- 0.25 packs/day for 6 years    Types: Cigarettes  . Smokeless tobacco: Not on file  . Alcohol Use: Yes     Comment: 2-3 drinks/wk   OB History    Gravida Para Term Preterm AB TAB SAB Ectopic Multiple Living   4 2 1 1 2 2  0 0 0 2      Review of Systems  Constitutional: Positive for fever (Subjective) and chills.  HENT: Positive for congestion, sinus pressure and sore throat. Negative for ear discharge and trouble swallowing.   Gastrointestinal: Negative for vomiting and diarrhea.  Musculoskeletal: Positive for myalgias.  All other systems reviewed and are negative.   Allergies  Celery oil; Codone; Dilaudid; and Strawberry  Home Medications   Prior to Admission medications   Medication Sig Start Date End Date Taking? Authorizing Provider  amLODipine (NORVASC) 5 MG tablet Take 1 tablet (5 mg total) by mouth daily. 10/09/14  Yes West Pugh, NP  diphenhydrAMINE (BENADRYL) 25 MG tablet Take 25 mg by mouth daily as needed for allergies.   Yes Historical Provider, MD  cetirizine-pseudoephedrine (ZYRTEC-D) 5-120 MG per tablet Take 1 tablet by mouth 2 (two) times daily. 01/27/15   Antonietta Breach, PA-C  ibuprofen (ADVIL,MOTRIN) 600 MG tablet Take 1 tablet (600 mg total) by mouth every 6 (six) hours as needed. 01/27/15  Antonietta Breach, PA-C  ondansetron (ZOFRAN ODT) 4 MG disintegrating tablet Take 1 tablet (4 mg total) by mouth every 8 (eight) hours as needed for nausea or vomiting. Patient not taking: Reported on 01/27/2015 03/16/14   Baron Sane, PA-C  promethazine (PHENERGAN) 25 MG tablet Take 1 tablet (25 mg total) by mouth every 6 (six) hours as needed for nausea or vomiting. Patient not taking: Reported on 01/27/2015 12/01/14   Seabron Spates, CNM  sodium chloride (OCEAN) 0.65 % SOLN nasal spray Place 1 spray into both nostrils as needed for congestion. 01/27/15   Antonietta Breach, PA-C   BP 161/117 mmHg  Pulse 85  Temp(Src) 98.3 F (36.8 C)  (Oral)  Resp 22  Ht 5' 3"  (1.6 m)  Wt 150 lb (68.04 kg)  BMI 26.58 kg/m2  SpO2 100%  LMP 12/27/2014   Physical Exam  Constitutional: She is oriented to person, place, and time. She appears well-developed and well-nourished. No distress.  Nontoxic/nonseptic appearing  HENT:  Head: Normocephalic and atraumatic.  Right Ear: Tympanic membrane, external ear and ear canal normal.  Left Ear: Tympanic membrane, external ear and ear canal normal.  Nose: Mucosal edema present. No rhinorrhea. Right sinus exhibits maxillary sinus tenderness and frontal sinus tenderness. Left sinus exhibits no maxillary sinus tenderness and no frontal sinus tenderness.  Mouth/Throat: Uvula is midline and mucous membranes are normal. No trismus in the jaw. No uvula swelling. Posterior oropharyngeal erythema present. No oropharyngeal exudate or posterior oropharyngeal edema.  Uvula midline. Patient tolerating secretions without difficulty  Eyes: Conjunctivae and EOM are normal. No scleral icterus.  Neck: Normal range of motion.  No nuchal rigidity or meningismus  Cardiovascular: Normal rate, regular rhythm and intact distal pulses.   Pulmonary/Chest: Effort normal. No respiratory distress. She has no wheezes. She has no rales.  Respirations even and unlabored. Lungs clear.  Musculoskeletal: Normal range of motion.  Neurological: She is alert and oriented to person, place, and time. She exhibits normal muscle tone. Coordination normal.  GCS 15. Speech is goal oriented. No focal neurologic deficits appreciated. Patient moves extremities without ataxia.  Skin: Skin is warm and dry. No rash noted. She is not diaphoretic. No erythema. No pallor.  Psychiatric: She has a normal mood and affect. Her behavior is normal.  Nursing note and vitals reviewed.   ED Course  Procedures (including critical care time) Labs Review Labs Reviewed  RAPID STREP SCREEN (NOT AT [facility])  CULTURE, GROUP A STREP  I-STAT BETA HCG BLOOD, ED  (MC, WL, AP ONLY)    Imaging Review Dg Chest 2 View  01/27/2015   CLINICAL DATA:  Acute onset of cough, shortness of breath and generalized body aches. Initial encounter.  EXAM: CHEST  2 VIEW  COMPARISON:  Chest radiograph performed 04/03/2013  FINDINGS: The lungs are well-aerated and clear. There is no evidence of focal opacification, pleural effusion or pneumothorax.  The heart is normal in size; the mediastinal contour is within normal limits. No acute osseous abnormalities are seen. A metallic piercing is noted overlying the upper chest.  IMPRESSION: No acute cardiopulmonary process seen.   Electronically Signed   By: Garald Balding M.D.   On: 01/27/2015 02:14     EKG Interpretation None      MDM   Final diagnoses:  Cough  Acute sinusitis, recurrence not specified, unspecified location  Sinus headache    Patient complaining of symptoms of sinusitis. Mild to moderate symptoms of clear/yellow nasal discharge/congestion and scratchy throat with cough for  less than 10 days. Patient is afebrile. No concern for acute bacterial rhinosinusitis; likely viral in nature. Patient discharged with symptomatic treatment. Patient instructions given for warm saline nasal washes. Recommendations for follow-up with primary care physician provided. Patient agreeable to plan with no unaddressed concerns. Patient discharged in good condition.     Antonietta Breach, PA-C 01/27/15 0254  Julianne Rice, MD 01/27/15 [phone]

## 2015-01-27 NOTE — ED Notes (Signed)
Pt complains of generalized muscle pain, achy joints, a cough, sore throat, and diarrhea. Pt states that she might be pregnant.

## 2015-01-29 LAB — CULTURE, GROUP A STREP: Strep A Culture: NEGATIVE

## 2015-09-13 ENCOUNTER — Encounter (HOSPITAL_COMMUNITY): Payer: Self-pay | Admitting: Emergency Medicine

## 2015-09-13 ENCOUNTER — Emergency Department (HOSPITAL_COMMUNITY): Admission: EM | Admit: 2015-09-13 | Discharge: 2015-09-13 | Discharge status: Against Medical Advice | Payer: Self-pay

## 2015-09-13 ENCOUNTER — Emergency Department (HOSPITAL_COMMUNITY)
Admission: EM | Admit: 2015-09-13 | Discharge: 2015-09-14 | Disposition: A | Discharge status: Home / Self Care | Payer: Self-pay | Attending: Emergency Medicine | Admitting: Emergency Medicine

## 2015-09-13 DIAGNOSIS — O210 Mild hyperemesis gravidarum: Secondary | ICD-10-CM | POA: Insufficient documentation

## 2015-09-13 DIAGNOSIS — O9989 Other specified diseases and conditions complicating pregnancy, childbirth and the puerperium: Secondary | ICD-10-CM | POA: Insufficient documentation

## 2015-09-13 DIAGNOSIS — Z3A00 Weeks of gestation of pregnancy not specified: Secondary | ICD-10-CM | POA: Insufficient documentation

## 2015-09-13 DIAGNOSIS — R1030 Lower abdominal pain, unspecified: Secondary | ICD-10-CM | POA: Insufficient documentation

## 2015-09-13 DIAGNOSIS — O99330 Smoking (tobacco) complicating pregnancy, unspecified trimester: Secondary | ICD-10-CM | POA: Insufficient documentation

## 2015-09-13 DIAGNOSIS — R011 Cardiac murmur, unspecified: Secondary | ICD-10-CM | POA: Insufficient documentation

## 2015-09-13 DIAGNOSIS — F17210 Nicotine dependence, cigarettes, uncomplicated: Secondary | ICD-10-CM | POA: Insufficient documentation

## 2015-09-13 LAB — CBC
HCT: 37.6 % (ref 36.0–46.0)
HEMOGLOBIN: 12.3 g/dL (ref 12.0–15.0)
MCH: 28.6 pg (ref 26.0–34.0)
MCHC: 32.7 g/dL (ref 30.0–36.0)
MCV: 87.4 fL (ref 78.0–100.0)
PLATELETS: 282 10*3/uL (ref 150–400)
RBC: 4.3 MIL/uL (ref 3.87–5.11)
RDW: 12.4 % (ref 11.5–15.5)
WBC: 5.5 10*3/uL (ref 4.0–10.5)

## 2015-09-13 LAB — I-STAT BETA HCG BLOOD, ED (MC, WL, AP ONLY)

## 2015-09-13 NOTE — ED Notes (Signed)
Attempted to call report for triage. No answer.

## 2015-09-13 NOTE — ED Notes (Signed)
Attempted to call patient for triage. No answer.

## 2015-09-13 NOTE — ED Notes (Signed)
Writer called for blood work, no response

## 2015-09-13 NOTE — ED Notes (Signed)
Pt states that she took a pregnancy test yesterday and it came back positive but she has been having lower abdominal pain and nausea/vomiting x 4 days. Alert and oriented.

## 2015-09-13 NOTE — ED Notes (Signed)
Pt called x 3 with no answer

## 2015-09-14 ENCOUNTER — Encounter (HOSPITAL_COMMUNITY): Payer: Self-pay | Admitting: *Deleted

## 2015-09-14 ENCOUNTER — Inpatient Hospital Stay (HOSPITAL_COMMUNITY)
Admission: AD | Admit: 2015-09-14 | Discharge: 2015-09-14 | Disposition: A | Discharge status: Home / Self Care | Payer: Self-pay | Source: Ambulatory Visit | Attending: Obstetrics and Gynecology | Admitting: Obstetrics and Gynecology

## 2015-09-14 ENCOUNTER — Inpatient Hospital Stay (HOSPITAL_COMMUNITY): Payer: Self-pay

## 2015-09-14 DIAGNOSIS — R1030 Lower abdominal pain, unspecified: Secondary | ICD-10-CM | POA: Insufficient documentation

## 2015-09-14 DIAGNOSIS — O10911 Unspecified pre-existing hypertension complicating pregnancy, first trimester: Secondary | ICD-10-CM

## 2015-09-14 DIAGNOSIS — R110 Nausea: Secondary | ICD-10-CM | POA: Insufficient documentation

## 2015-09-14 DIAGNOSIS — O9989 Other specified diseases and conditions complicating pregnancy, childbirth and the puerperium: Secondary | ICD-10-CM

## 2015-09-14 DIAGNOSIS — Z3491 Encounter for supervision of normal pregnancy, unspecified, first trimester: Secondary | ICD-10-CM

## 2015-09-14 DIAGNOSIS — O26891 Other specified pregnancy related conditions, first trimester: Secondary | ICD-10-CM | POA: Insufficient documentation

## 2015-09-14 DIAGNOSIS — Z3A01 Less than 8 weeks gestation of pregnancy: Secondary | ICD-10-CM | POA: Insufficient documentation

## 2015-09-14 DIAGNOSIS — R51 Headache: Secondary | ICD-10-CM | POA: Insufficient documentation

## 2015-09-14 DIAGNOSIS — R102 Pelvic and perineal pain: Secondary | ICD-10-CM

## 2015-09-14 LAB — COMPREHENSIVE METABOLIC PANEL
ALT: 15 U/L (ref 14–54)
AST: 19 U/L (ref 15–41)
Albumin: 4.4 g/dL (ref 3.5–5.0)
Alkaline Phosphatase: 40 U/L (ref 38–126)
Anion gap: 9 (ref 5–15)
BUN: 12 mg/dL (ref 6–20)
CHLORIDE: 104 mmol/L (ref 101–111)
CO2: 24 mmol/L (ref 22–32)
Calcium: 9.3 mg/dL (ref 8.9–10.3)
Creatinine, Ser: 0.77 mg/dL (ref 0.44–1.00)
GFR calc Af Amer: >60 mL/min (ref >60)
Glucose, Bld: 87 mg/dL (ref 65–99)
Potassium: 3.4 mmol/L — ABNORMAL LOW (ref 3.5–5.1)
SODIUM: 137 mmol/L (ref 135–145)
TOTAL PROTEIN: 7.3 g/dL (ref 6.5–8.1)
Total Bilirubin: 0.6 mg/dL (ref 0.3–1.2)

## 2015-09-14 LAB — URINALYSIS, ROUTINE W REFLEX MICROSCOPIC
BILIRUBIN URINE: NEGATIVE
BILIRUBIN URINE: NEGATIVE
Glucose, UA: NEGATIVE mg/dL
Glucose, UA: NEGATIVE mg/dL
Hgb urine dipstick: NEGATIVE
Hgb urine dipstick: NEGATIVE
KETONES UR: NEGATIVE mg/dL
KETONES UR: NEGATIVE mg/dL
Leukocytes, UA: NEGATIVE
Leukocytes, UA: NEGATIVE
NITRITE: NEGATIVE
NITRITE: NEGATIVE
PROTEIN: NEGATIVE mg/dL
PROTEIN: NEGATIVE mg/dL
SPECIFIC GRAVITY, URINE: 1.01 (ref 1.005–1.030)
Specific Gravity, Urine: 1.018 (ref 1.005–1.030)
pH: 6.5 (ref 5.0–8.0)
pH: 7 (ref 5.0–8.0)

## 2015-09-14 LAB — RAPID URINE DRUG SCREEN, HOSP PERFORMED
AMPHETAMINES: NOT DETECTED
Barbiturates: NOT DETECTED
Benzodiazepines: NOT DETECTED
COCAINE: NOT DETECTED
OPIATES: NOT DETECTED
TETRAHYDROCANNABINOL: POSITIVE — AB

## 2015-09-14 LAB — WET PREP, GENITAL
Sperm: NONE SEEN
Trich, Wet Prep: NONE SEEN
Yeast Wet Prep HPF POC: NONE SEEN

## 2015-09-14 LAB — HCG, QUANTITATIVE, PREGNANCY: hCG, Beta Chain, Quant, S: 56052 m[IU]/mL — ABNORMAL HIGH (ref <5)

## 2015-09-14 LAB — LIPASE, BLOOD: Lipase: 29 U/L (ref 11–51)

## 2015-09-14 NOTE — Discharge Instructions (Signed)
Abdominal Pain During Pregnancy Abdominal pain is common in pregnancy. Most of the time, it does not cause harm. There are many causes of abdominal pain. Some causes are more serious than others. Some of the causes of abdominal pain in pregnancy are easily diagnosed. Occasionally, the diagnosis takes time to understand. Other times, the cause is not determined. Abdominal pain can be a sign that something is very wrong with the pregnancy, or the pain may have nothing to do with the pregnancy at all. For this reason, always tell your health care provider if you have any abdominal discomfort. HOME CARE INSTRUCTIONS  Monitor your abdominal pain for any changes. The following actions may help to alleviate any discomfort you are experiencing:  Do not have sexual intercourse or put anything in your vagina until your symptoms go away completely.  Get plenty of rest until your pain improves.  Drink clear fluids if you feel nauseous. Avoid solid food as long as you are uncomfortable or nauseous.  Only take over-the-counter or prescription medicine as directed by your health care provider.  Keep all follow-up appointments with your health care provider. SEEK IMMEDIATE MEDICAL CARE IF:  You are bleeding, leaking fluid, or passing tissue from the vagina.  You have increasing pain or cramping.  You have persistent vomiting.  You have painful or bloody urination.  You have a fever.  You notice a decrease in your baby's movements.  You have extreme weakness or feel faint.  You have shortness of breath, with or without abdominal pain.  You develop a severe headache with abdominal pain.  You have abnormal vaginal discharge with abdominal pain.  You have persistent diarrhea.  You have abdominal pain that continues even after rest, or gets worse. MAKE SURE YOU:   Understand these instructions.  Will watch your condition.  Will get help right away if you are not doing well or get worse.     This information is not intended to replace advice given to you by your health care provider. Make sure you discuss any questions you have with your health care provider.   Document Released: 07/09/2005 Document Revised: 04/29/2013 Document Reviewed: 02/05/2013 Elsevier Interactive Patient Education 2016 [employer].  [facility] Guide (Revised August 2014)   Chronic Pain Problems:   [place] [facility]:  [phone]           Patients need to be referred by their primary care doctor/specialist  Insufficient Money for Medicine:           United Way: call "211"    MAP Program at [place] or HP [phone]            No Primary Care Doctor:  To locate a primary care doctor that accepts your insurance or provides certain services:           [place]: [phone]           Physician Referral Service: [phone] ask for My [place]  If no insurance, you need to see if you qualify for [facility] orange card, call to set      up appointment for eligibility/enrollment at [phone] or [phone] or visit [place] ([address], [place] and [place]) to meet with a [facility] enrollment specialist.  Agencies that provide inexpensive (sliding fee scale) medical care:       [facility] - Family Medicine at  Cornelia Copa [phone]     [facility]  -  [facility] - [place] [facility] - [phone]     [place] [phone]     [facility] - [place] [phone]  [facility] - [place] @ [place] [phone]  [facility] - [place] @ [place] - [phone]  [facility]: [phone]    Women's Clinic: [phone]   Planned Parenthood: [phone]   [facility] the [place]    [place] Providers:           [facility] - [phone] (No Family Planning accepted)          [address], [phone], Mon-Fri 9am-5pm          [place] [phone]  [place], [place], Mon-Thursday 8am-5pm, Fri 8am-noon  [employer]          [address], Mon-Fri 7:30am-4:30pm          [place] - [phone]          [address], [place] 8am-5pm          [facility] - [phone] [address], [address]          Only accepts [place] [employer] patients after they have their name applied to their card  Self Pay (no insurance) in [facility]:           Sickle Cell Patients:   [place], [phone] [facility]:  [address], [place] [phone]       [facility] and [facility]ness  [address], [place] [phone]  [facility]:  [address], [phone]          [facility]           [place], [phone] [facility] for [place], [phone]           [facility] Urgent [place]           [place] [address], [place]        [facility] - [address]           [phone], Mon-Fri 9am-7pm, [place] 9am-1pm          [facility] - Family Medicine @ [facility]          [place], [place]          [facility] - [facility]           [address], [place] [facility] Adult and [place]  [address] [place] [phone]          [place], [place]  [facility] -  [place]   [place], [phone] [facility] and [place]  [address] [phone]  Dr. Vista Lawman           [address], [place], [place] [facility]           [address] [phone]          [facility]             [address] [phone]          [facility]           [place], [place], 1st & 3rd Saturday every month, 10am-1pm  OTHERS:  [facility]  ([facility] Only)  [phone] (Thursday only)  Strategies for finding a Primary Care Provider:  1) Find a Doctor and Pay Out of Pocket  Although you won't have to find out who is covered by your insurance plan, it is a good idea to ask around and get recommendations. You will then need to call the office and see if the doctor you have chosen will accept you as a new patient and what types of options they offer for patients who are self-pay. Some doctors offer discounts or will set up payment plans for their patients who do not have insurance, but you will need to ask so you aren't surprised when you get to your appointment.  2) [place] - To see if you qualify for orange card access to healthcare safety net providers.  Call for appointment for eligibility/enrollment at [phone] or [phone]. (Uninsured, 0-200% FPL, qualifying info)  Applicants for [facility] are first required to see if they are eligible to enroll in the [facility]al Marketplace before enrolling in [facility] (and get an exemption if they are not).  GCCN Criteria for acceptance is:  ? Proof of ACA Marketing exemption - form or documentation  ? Valid photo ID (driver's license, state identification card, passport, home country ID)  ? Proof of [facility] residency (e.g.  drivers license, lease/landlord information, pay stubs with address, utility bill, bank statement, etc.)  ? Proof of income (1040, last year's tax return, W2, 4 current pay stubs, other income proof)  ? Proof of assets (current bank statement + 3 most recent, disability paperwork, life insurance info, tax value on autos, etc.)  3) [place] Department  Not all health departments have doctors that can see patients for sick visits, but many do, so it is worth a call to see if yours does. If you don't know where your local health department is, you can check in your phone book. The CDC also has a tool to help you locate your state's health department, and many state websites also have listings of all of their local health departments.  4) Find a [facility]  If your illness is not likely to be very severe or complicated, you may want to try a walk in clinic. These are popping up all over the country in pharmacies, drugstores, and shopping centers. They're usually staffed by nurse practitioners or physician assistants that have been trained to treat common illnesses and complaints. They're usually fairly quick and inexpensive. However, if you have serious medical issues or chronic medical problems, these are probably not your best option   STD Testing:  [place], [place] [facility]           [address] [place], phone [phone] or [phone]           Monday - Friday, call for an appointment          [place], [place] [facility]           [address], [place], phone [phone] or [phone]           Monday - Friday, call for an appointment Abuse/Neglect:           [place]: [place]: [phone] (After Hours)  Emergency Shelter:  [facility] [phone]  [place]-  [phone]  [place] - [phone]  Youth Focus - Act Together - [phone] (ages 83-17)  [place] @ [employer] - [phone]   Mammograms - Free at [facility] - [place]:           Room at the [place]: [phone]   (Homeless mother with children)          [place]: [phone] (Mothers only)  Youth Focus: [phone] (Pregnant 49-33 years old)  Adopt a Mom [phone]  [facility] [place]   [facility] and [place]  [address] [place] [phone]          [facility] [place]           [address], [place], [place]          [facility] Dept.           [place], [place]          [facility]           [phone]          [facility] in [place]           [phone], [place]           [phone]           [phone] (After Hours)  [place] Abuse Resources:           Alcohol and Drug Services: [phone]           [facility]: [place]: [phone]   Narcotics Helpline - [phone]          [facility]: [phone]  Residential & Outpatient Substance Abuse Program - [place]: [phone]  [employer]  [place] - [phone] Psychological Services:          [place]: [phone]   Therapeutic Alternatives: [phone]          [place]           [address]: [phone]     (24 Hour)  Mobile Crisis:   [place]:  Radio producer on [place] [phone] [facility] on [place] [phone]  Walk In [place]  ([place] - [phone] or [phone]  [place]. [place] [phone]  [place] [address] [place] [phone]   Dental Assistance:  If unable to pay or uninsured, contact: [facility]. to become qualified for the adult dental clinic. Patient must be enrolled in [facility] (uninsured, 0-200% FPL, qualifying info).  Enroll in [facility] first, then see Primary Care Physician assigned to you, the PCP makes a dental referral. [place] Adult [facility] Access Program will receive referral and contacts patient for appointment.  Patients with Medicaid           [address] [place] (Children up to 86 + Pregnant Women) - [phone]  [place] - [phone]  If unable to pay, or uninsured: contact [place] [phone] in [place] - ([place] only + Pregnant Women), [phone] in [place] only) to become qualified for the adult dental clinic  Must see if eligible to enroll in [place] before enrolling into the [facility] (exemption required) [phone] for an appointment)  [email or web address];   [phone].  If not eligible for ACA, then go by Department of Health and Human Services to see if eligible for orange card.  [address] [place].  Once you get an orange card, you will have a Primary Care home who will then refer you to dental if needed.        Other Personal assistant:   [facility] [phone] (ext [phone])   [address]  Dr. Donn Pierini - [phone]   [place]   [facility]           [place], [place], [place], [address]           [phone], Ext. [phone]           2nd and 4th Thursday of the month at 6:30am (Simple extractions only - no wisdom teeth or surgery) First come/First serve -First 10 clients served           [facility] [place], [place] and [facility] residents only)          [address], [place], [place], [address]           [phone]  [place]          [place]         [facility]          [phone]   Transportation Options:  Ambulance - 911 - [phone] per ride Family Member to accompany patient (if stable) - [place] - [phone]  PART - [phone]  Taxi - [phone] - [place] [phone] (Application required)  [facility] - [phone]

## 2015-09-14 NOTE — MAU Note (Signed)
Pt presents to MAU with complaints of lower abdominal cramping that started 6 days ago. Pt went to [place] [facility] last night to be evaluated but had to leave before she was evaluated

## 2015-09-14 NOTE — MAU Provider Note (Signed)
History     CSN: [id number]  Arrival date and time: 09/14/15 1739   First Provider Initiated Contact with Patient 09/14/15 1818      Chief Complaint  Patient presents with  . Abdominal Pain   HPI Comments: 30 yo AA female [id number] at [id number] today for lower abdominal cramping and N/V. Patient states that the lower abdominal cramping started approx 6 days ago. The pain was sudden and describes it as a sharp, twisting pain that does not radiate. She rates the pain a 8/10 that waxes and wanes. Nothing makes better or worse. She reports an episode of spotting 6 days ago that has since resolved. She took a home pregnancy test 2 days ago that was positive. She has had N/V since her abdominal cramping started. She has not vomited today but is nausea. She denies any fever, chill, diarrhea, vaginal bleeding/discharge, vaginal itching, urinary symptoms or change in bowel habits. Patient is sexually active and does not use protection. She is concerned for STIs and is requesting testing. [facility] due to the wait. Patient bp was elevated in triage (145/102). She states that she use to be on antihypertensive medication but unable to get medication filled after her medicaid expired 6 months ago. She endorses intermittent headaches without any visual changes. Not currently experiencing headache.   OB History    Gravida Para Term Preterm AB TAB SAB Ectopic Multiple Living   5 2 1 1 2 2  0 0 0 2      Past Medical History  Diagnosis Date  . [place]   . Pregnancy induced hypertension   . Preterm labor   . Heart murmur   . Urinary tract infection   . Kidney stone   . Anxiety   . Depression     No specific treatment  . PID (acute pelvic inflammatory disease)   . Chlamydia   . Gonorrhea   . Genital warts     Past Surgical History  Procedure Laterality Date  . Abortion      x2    Family History  Problem Relation Age of  Onset  . Anesthesia problems Neg Hx   . Asthma Mother   . Heart disease Mother   . Diabetes Mother   . Hypertension Mother   . Cancer Father     Breast  . Asthma Sister   . Depression Sister   . Diabetes Sister   . Hypertension Sister   . Diabetes Other     Social History  Substance Use Topics  . Smoking status: Current Every Day Smoker -- 0.25 packs/day for 6 years    Types: Cigarettes  . Smokeless tobacco: None  . Alcohol Use: Yes     Comment: 2-3 drinks/wk    Allergies:  Allergies  Allergen Reactions  . Celery Oil Itching  . Codone [Hydrocodone] Itching  . Dilaudid [Hydromorphone Hcl] Itching  . Strawberry Extract Itching    Prescriptions prior to admission  Medication Sig Dispense Refill Last Dose  . diphenhydrAMINE (BENADRYL) 25 MG tablet Take 25 mg by mouth daily as needed for allergies.   Past Week at Unknown time  . ibuprofen (ADVIL,MOTRIN) 200 MG tablet Take 400 mg by mouth every 6 (six) hours as needed for mild pain.   09/13/2015 at Unknown time    Review of Systems  Constitutional: Negative for fever and chills.  Eyes: Negative for blurred vision and double  vision.  Respiratory: Negative for shortness of breath.   Cardiovascular: Negative for chest pain and palpitations.  Gastrointestinal: Positive for nausea, vomiting and abdominal pain. Negative for diarrhea, constipation, blood in stool and melena.  Genitourinary: Negative.        Denies vaginal discharge  Neurological: Positive for headaches (Intermittent ). Negative for dizziness.   Results for orders placed or performed during the hospital encounter of 09/14/15 (from the past 24 hour(s))  Urinalysis, Routine w reflex microscopic (not at [facility])     Status: None   Collection Time: 09/14/15  5:50 PM  Result Value Ref Range   Color, Urine YELLOW YELLOW   APPearance CLEAR CLEAR   Specific Gravity, Urine 1.010 1.005 - 1.030   pH 7.0 5.0 - 8.0   Glucose, UA NEGATIVE NEGATIVE mg/dL   Hgb urine dipstick  NEGATIVE NEGATIVE   Bilirubin Urine NEGATIVE NEGATIVE   Ketones, ur NEGATIVE NEGATIVE mg/dL   Protein, ur NEGATIVE NEGATIVE mg/dL   Nitrite NEGATIVE NEGATIVE   Leukocytes, UA NEGATIVE NEGATIVE   Physical Exam   Blood pressure 145/102, pulse 82, temperature 98.2 F (36.8 C), resp. rate 18, last menstrual period 08/06/2015.  Physical Exam  Constitutional: She is oriented to person, place, and time. She appears well-developed and well-nourished. No distress.  Cardiovascular: Normal rate, regular rhythm and normal heart sounds.   Respiratory: Effort normal and breath sounds normal.  GI: Soft. Bowel sounds are normal. She exhibits no distension. There is tenderness (Lower abdomen ). There is no rebound and no guarding.  Neurological: She is alert and oriented to person, place, and time.  Skin: Skin is warm and dry.    MAU Course  Procedures  MDM UA wnl HCG Quant Wet prep GC/Chlym HIV   Assessment and Plan    Kristen Chung 09/14/2015, 6:47 PM   CNM attestation:  I have seen and examined this patient; I agree with above documentation in the student's note.   Kristen Chung is a 30 y.o. [id number] reporting abdominal cramping x 6 days and positive home pregnancy test.  She has tried Tylenol for pain which does not help.  The pain is associated with nausea but she denies vomiting.  Nothing makes the pain better or worse. Denies VB, urinary symptoms, vaginal itching/burning.  PE: BP 145/102 mmHg  Pulse 82  Temp(Src) 98.2 F (36.8 C)  Resp 18  LMP 08/06/2015 (Approximate) Gen: calm comfortable, NAD Resp: normal effort, no distress Abd: soft, nontender  ROS, labs, PMH reviewed  Results for orders placed or performed during the hospital encounter of 09/14/15 (from the past 24 hour(s))  Urinalysis, Routine w reflex microscopic (not at [facility])     Status: None   Collection Time: 09/14/15  5:50 PM  Result Value Ref Range   Color, Urine YELLOW YELLOW   APPearance CLEAR CLEAR    Specific Gravity, Urine 1.010 1.005 - 1.030   pH 7.0 5.0 - 8.0   Glucose, UA NEGATIVE NEGATIVE mg/dL   Hgb urine dipstick NEGATIVE NEGATIVE   Bilirubin Urine NEGATIVE NEGATIVE   Ketones, ur NEGATIVE NEGATIVE mg/dL   Protein, ur NEGATIVE NEGATIVE mg/dL   Nitrite NEGATIVE NEGATIVE   Leukocytes, UA NEGATIVE NEGATIVE  hCG, quantitative, pregnancy     Status: Abnormal   Collection Time: 09/14/15  6:30 PM  Result Value Ref Range   hCG, Beta Chain, Quant, S 56052 (H) <5 mIU/mL  Wet prep, genital     Status: Abnormal   Collection Time: 09/14/15  7:23 PM  Result Value Ref Range   Yeast Wet Prep HPF POC NONE SEEN NONE SEEN   Trich, Wet Prep NONE SEEN NONE SEEN   Clue Cells Wet Prep HPF POC PRESENT (A) NONE SEEN   WBC, Wet Prep HPF POC MODERATE (A) NONE SEEN   Sperm NONE SEEN    [place] with GS and YS confirming IUP  Plan: D/C home Follow up with prenatal care.  Pt may desire termination of pregnancy.  Return to MAU as needed for emergencies   LEFTWICH-KIRBY, Kalen Ratajczak, CNM 8:17 PM

## 2015-09-15 ENCOUNTER — Encounter (HOSPITAL_COMMUNITY): Payer: Self-pay | Admitting: *Deleted

## 2015-09-15 ENCOUNTER — Inpatient Hospital Stay (HOSPITAL_COMMUNITY)
Admission: AD | Admit: 2015-09-15 | Discharge: 2015-09-15 | Disposition: A | Discharge status: Home / Self Care | Payer: Self-pay | Source: Ambulatory Visit | Attending: Obstetrics & Gynecology | Admitting: Obstetrics & Gynecology

## 2015-09-15 DIAGNOSIS — O209 Hemorrhage in early pregnancy, unspecified: Secondary | ICD-10-CM

## 2015-09-15 DIAGNOSIS — O200 Threatened abortion: Secondary | ICD-10-CM | POA: Insufficient documentation

## 2015-09-15 DIAGNOSIS — O99331 Smoking (tobacco) complicating pregnancy, first trimester: Secondary | ICD-10-CM | POA: Insufficient documentation

## 2015-09-15 DIAGNOSIS — Z3A01 Less than 8 weeks gestation of pregnancy: Secondary | ICD-10-CM | POA: Insufficient documentation

## 2015-09-15 DIAGNOSIS — F17210 Nicotine dependence, cigarettes, uncomplicated: Secondary | ICD-10-CM | POA: Insufficient documentation

## 2015-09-15 LAB — GC/CHLAMYDIA PROBE AMP (~~LOC~~) NOT AT ARMC
Chlamydia: NEGATIVE
NEISSERIA GONORRHEA: NEGATIVE

## 2015-09-15 LAB — HIV ANTIBODY (ROUTINE TESTING W REFLEX): HIV Screen 4th Generation wRfx: NONREACTIVE

## 2015-09-15 LAB — HCG, QUANTITATIVE, PREGNANCY: hCG, Beta Chain, Quant, S: 69400 m[IU]/mL — ABNORMAL HIGH (ref <5)

## 2015-09-15 NOTE — MAU Provider Note (Signed)
History     CSN: [id number]  Arrival date and time: 09/15/15 1336   First Provider Initiated Contact with Patient 09/15/15 1416      Chief Complaint  Patient presents with  . Abdominal Pain  . Vaginal Bleeding   HPI   Ms.Kristen Chung is a 30 y.o. female [phone] presenting to MAU with abdominal pain and vaginal bleeding. The pain started yesterday and she was seen for this yesterday. She had an IUP on ultrasound. The patient is unsure whether she is going to keep this pregnancy.  The vaginal bleeding started today around noon. The bleeding started off as light and progressed to heavy.   She continues to have lower abdominal pain; bilateral. The pain is similar to what it was yesterday.   OB History    Gravida Para Term Preterm AB TAB SAB Ectopic Multiple Living   5 2 1 1 2 2  0 0 0 2      Past Medical History  Diagnosis Date  . Crohn disease ([place])   . Pregnancy induced hypertension   . Preterm labor   . Heart murmur   . Urinary tract infection   . Kidney stone   . Anxiety   . Depression     No specific treatment  . PID (acute pelvic inflammatory disease)   . Chlamydia   . Gonorrhea   . Genital warts     Past Surgical History  Procedure Laterality Date  . Abortion      x2    Family History  Problem Relation Age of Onset  . Anesthesia problems Neg Hx   . Asthma Mother   . Heart disease Mother   . Diabetes Mother   . Hypertension Mother   . Cancer Father     Breast  . Asthma Sister   . Depression Sister   . Diabetes Sister   . Hypertension Sister   . Diabetes Other     Social History  Substance Use Topics  . Smoking status: Current Every Day Smoker -- 0.25 packs/day for 6 years    Types: Cigarettes  . Smokeless tobacco: None  . Alcohol Use: Yes     Comment: 2-3 drinks/wk    Allergies:  Allergies  Allergen Reactions  . Celery Oil Itching  . Codone [Hydrocodone] Itching  . Dilaudid [Hydromorphone Hcl] Itching  . Strawberry Extract Itching     Prescriptions prior to admission  Medication Sig Dispense Refill Last Dose  . diphenhydrAMINE (BENADRYL) 25 MG tablet Take 25 mg by mouth daily as needed for allergies.   Past Week at Unknown time   Results for orders placed or performed during the hospital encounter of 09/15/15 (from the past 48 hour(s))  hCG, quantitative, pregnancy     Status: Abnormal   Collection Time: 09/15/15  2:57 PM  Result Value Ref Range   hCG, Beta Chain, Quant, S 69400 (H) <5 mIU/mL    Comment:          GEST. AGE      CONC.  (mIU/mL)   <=1 WEEK        5 - 50     2 WEEKS       50 - 500     3 WEEKS       100 - 10,000     4 WEEKS     1,000 - 30,000     5 WEEKS     3,500 - 115,000   6-8 WEEKS  12,000 - 270,000    12 WEEKS     15,000 - 220,000        FEMALE AND NON-PREGNANT FEMALE:     LESS THAN 5 mIU/mL    US Ob Comp Less 14 Wks  09/14/2015  CLINICAL DATA:  Pregnant patient first-trimester pregnancy with pelvic pain. Pelvic cramping for 6 days. EXAM: OBSTETRIC <14 WK [place] AND TRANSVAGINAL OB US TECHNIQUE: Both transabdominal and transvaginal ultrasound examinations were performed for complete evaluation of the gestation as well as the maternal uterus, adnexal regions, and pelvic cul-de-sac. Transvaginal technique was performed to assess early pregnancy. COMPARISON:  None this pregnancy. FINDINGS: Intrauterine gestational sac: Visualized/normal in shape. Yolk sac:  Present. Embryo:  Not present. MSD: 16.6  mm   5 w   4  d [place] EDC: 05/05/2016 Subchorionic hemorrhage:  None visualized. Maternal uterus/adnexae: Both ovaries are visualized and are normal. No pelvic free fluid. IMPRESSION: Intrauterine gestational sac with yolk sac, no fetal pole or cardiac activity. Recommend trending of beta HCG and follow-up ultrasound in 7-10 days. Electronically Signed   By: Jeb Levering M.D.   On: 09/14/2015 20:11   US Ob Transvaginal  09/14/2015  CLINICAL DATA:  Pregnant patient first-trimester pregnancy with pelvic pain.  Pelvic cramping for 6 days. EXAM: OBSTETRIC <14 WK [place] AND TRANSVAGINAL OB US TECHNIQUE: Both transabdominal and transvaginal ultrasound examinations were performed for complete evaluation of the gestation as well as the maternal uterus, adnexal regions, and pelvic cul-de-sac. Transvaginal technique was performed to assess early pregnancy. COMPARISON:  None this pregnancy. FINDINGS: Intrauterine gestational sac: Visualized/normal in shape. Yolk sac:  Present. Embryo:  Not present. MSD: 16.6  mm   5 w   4  d [place] EDC: 05/05/2016 Subchorionic hemorrhage:  None visualized. Maternal uterus/adnexae: Both ovaries are visualized and are normal. No pelvic free fluid. IMPRESSION: Intrauterine gestational sac with yolk sac, no fetal pole or cardiac activity. Recommend trending of beta HCG and follow-up ultrasound in 7-10 days. Electronically Signed   By: Jeb Levering M.D.   On: 09/14/2015 20:11    Review of Systems  Constitutional: Negative for fever and chills.  Gastrointestinal: Positive for abdominal pain.  Genitourinary: Negative for dysuria.   Physical Exam   Blood pressure 118/81, pulse 75, temperature 98.2 F (36.8 C), temperature source Oral, resp. rate 18, height 5' 4"  (1.626 m), weight 143 lb (64.864 kg), last menstrual period 08/06/2015.  Physical Exam  Constitutional: She is oriented to person, place, and time. She appears well-developed and well-nourished. No distress.  HENT:  Head: Normocephalic.  Eyes: Pupils are equal, round, and reactive to light.  Respiratory: Effort normal.  Genitourinary:  Speculum exam: Vagina - Small amount dark red, stringy discharge.  Cervix - No active bleeding  Bimanual exam: Cervix closed Uterus non tender, normal size Adnexa non tender, no masses bilaterally Chaperone present for exam.  Musculoskeletal: Normal range of motion.  Neurological: She is alert and oriented to person, place, and time.  Skin: Skin is warm. She is not diaphoretic.   Psychiatric: Her behavior is normal.    MAU Course  Procedures  None  MDM  Beta hcg level 2/22: 56052 Beta hcg level 2/23: 69400  A positive blood type   Assessment and Plan   A:  1. Vaginal bleeding in pregnancy, first trimester   2. Threatened miscarriage in early pregnancy      P:  Discharge home in stable condition Repeat US for viabilitiy in 10 days.  Bleeding precautions  Pelvic rest Return to MAU if symptoms worsen    Lezlie Lye, NP 09/15/2015 4:30 PM

## 2015-09-15 NOTE — Discharge Instructions (Signed)
Threatened Miscarriage A threatened miscarriage is when you have vaginal bleeding during your first 20 weeks of pregnancy but the pregnancy has not ended. Your doctor will do tests to make sure you are still pregnant. The cause of the bleeding may not be known. This condition does not mean your pregnancy will end. It does increase the risk of it ending (complete miscarriage). HOME CARE   Make sure you keep all your doctor visits for prenatal care.  Get plenty of rest.  Do not have sex or use tampons if you have vaginal bleeding.  Do not douche.  Do not smoke or use drugs.  Do not drink alcohol.  Avoid caffeine. GET HELP IF:  You have light bleeding from your vagina.  You have belly pain or cramping.  You have a fever. GET HELP RIGHT AWAY IF:   You have heavy bleeding from your vagina.  You have clots of blood coming from your vagina.  You have bad pain or cramps in your low back or belly.  You have fever, chills, and bad belly pain. MAKE SURE YOU:   Understand these instructions.  Will watch your condition.  Will get help right away if you are not doing well or get worse.   This information is not intended to replace advice given to you by your health care provider. Make sure you discuss any questions you have with your health care provider.   Document Released: 06/21/2008 Document Revised: 07/14/2013 Document Reviewed: 05/05/2013 Elsevier Interactive Patient Education 2016 Elsevier Inc[employer]  Vaginal Bleeding During Pregnancy, First Trimester A small amount of bleeding (spotting) from the vagina is common in early pregnancy. Sometimes the bleeding is normal and is not a problem, and sometimes it is a sign of something serious. Be sure to tell your doctor about any bleeding from your vagina right away. HOME CARE  Watch your condition for any changes.  Follow your doctor's instructions about how active you can be.  If you are on bed rest:  You may need to stay in  bed and only get up to use the bathroom.  You may be allowed to do some activities.  If you need help, make plans for someone to help you.  Write down:  The number of pads you use each day.  How often you change pads.  How soaked (saturated) your pads are.  Do not use tampons.  Do not douche.  Do not have sex or orgasms until your doctor says it is okay.  If you pass any tissue from your vagina, save the tissue so you can show it to your doctor.  Only take medicines as told by your doctor.  Do not take aspirin because it can make you bleed.  Keep all follow-up visits as told by your doctor. GET HELP IF:   You bleed from your vagina.  You have cramps.  You have labor pains.  You have a fever that does not go away after you take medicine. GET HELP RIGHT AWAY IF:   You have very bad cramps in your back or belly (abdomen).  You pass large clots or tissue from your vagina.  You bleed more.  You feel light-headed or weak.  You pass out (faint).  You have chills.  You are leaking fluid or have a gush of fluid from your vagina.  You pass out while pooping (having a bowel movement). MAKE SURE YOU:  Understand these instructions.  Will watch your condition.  Will get help right  away if you are not doing well or get worse.   This information is not intended to replace advice given to you by your health care provider. Make sure you discuss any questions you have with your health care provider.   Document Released: 11/23/2013 Document Reviewed: 11/23/2013 Elsevier Interactive Patient Education [employer].

## 2015-09-15 NOTE — MAU Note (Signed)
Was here last night for pain, woke up today and is bleeding. . Had spotting like a wk ago, now is really bleeding. Still having the pain

## 2015-11-09 ENCOUNTER — Emergency Department (HOSPITAL_COMMUNITY)
Admission: EM | Admit: 2015-11-09 | Discharge: 2015-11-09 | Disposition: A | Discharge status: Home / Self Care | Payer: Self-pay | Attending: Emergency Medicine | Admitting: Emergency Medicine

## 2015-11-09 ENCOUNTER — Encounter (HOSPITAL_COMMUNITY): Payer: Self-pay | Admitting: Emergency Medicine

## 2015-11-09 DIAGNOSIS — R062 Wheezing: Secondary | ICD-10-CM | POA: Insufficient documentation

## 2015-11-09 DIAGNOSIS — Z8742 Personal history of other diseases of the female genital tract: Secondary | ICD-10-CM | POA: Insufficient documentation

## 2015-11-09 DIAGNOSIS — Y998 Other external cause status: Secondary | ICD-10-CM | POA: Insufficient documentation

## 2015-11-09 DIAGNOSIS — T7840XA Allergy, unspecified, initial encounter: Secondary | ICD-10-CM

## 2015-11-09 DIAGNOSIS — Y9389 Activity, other specified: Secondary | ICD-10-CM | POA: Insufficient documentation

## 2015-11-09 DIAGNOSIS — Z8719 Personal history of other diseases of the digestive system: Secondary | ICD-10-CM | POA: Insufficient documentation

## 2015-11-09 DIAGNOSIS — Z8751 Personal history of pre-term labor: Secondary | ICD-10-CM | POA: Insufficient documentation

## 2015-11-09 DIAGNOSIS — R011 Cardiac murmur, unspecified: Secondary | ICD-10-CM | POA: Insufficient documentation

## 2015-11-09 DIAGNOSIS — T781XXA Other adverse food reactions, not elsewhere classified, initial encounter: Secondary | ICD-10-CM | POA: Insufficient documentation

## 2015-11-09 DIAGNOSIS — Z8659 Personal history of other mental and behavioral disorders: Secondary | ICD-10-CM | POA: Insufficient documentation

## 2015-11-09 DIAGNOSIS — X58XXXA Exposure to other specified factors, initial encounter: Secondary | ICD-10-CM | POA: Insufficient documentation

## 2015-11-09 DIAGNOSIS — F17210 Nicotine dependence, cigarettes, uncomplicated: Secondary | ICD-10-CM | POA: Insufficient documentation

## 2015-11-09 DIAGNOSIS — Y9289 Other specified places as the place of occurrence of the external cause: Secondary | ICD-10-CM | POA: Insufficient documentation

## 2015-11-09 DIAGNOSIS — Z8619 Personal history of other infectious and parasitic diseases: Secondary | ICD-10-CM | POA: Insufficient documentation

## 2015-11-09 DIAGNOSIS — Z87440 Personal history of urinary (tract) infections: Secondary | ICD-10-CM | POA: Insufficient documentation

## 2015-11-09 DIAGNOSIS — R42 Dizziness and giddiness: Secondary | ICD-10-CM | POA: Insufficient documentation

## 2015-11-09 DIAGNOSIS — R0602 Shortness of breath: Secondary | ICD-10-CM | POA: Insufficient documentation

## 2015-11-09 DIAGNOSIS — Z87442 Personal history of urinary calculi: Secondary | ICD-10-CM | POA: Insufficient documentation

## 2015-11-09 LAB — I-STAT BETA HCG BLOOD, ED (MC, WL, AP ONLY): I-stat hCG, quantitative: 28.3 m[IU]/mL — ABNORMAL HIGH (ref <5)

## 2015-11-09 MED ORDER — PREDNISONE 50 MG PO TABS: 50.0000 mg | ORAL_TABLET | Freq: Every day | ORAL | Status: DC

## 2015-11-09 MED ORDER — DIPHENHYDRAMINE HCL 25 MG PO CAPS
50.0000 mg | ORAL_CAPSULE | Freq: Once | ORAL | Status: AC
  Administered 2015-11-09: 50 mg via ORAL
  Filled 2015-11-09: qty 2

## 2015-11-09 MED ORDER — RANITIDINE HCL 150 MG/10ML PO SYRP
150.0000 mg | ORAL_SOLUTION | Freq: Once | ORAL | Status: AC
  Administered 2015-11-09: 150 mg via ORAL
  Filled 2015-11-09: qty 10

## 2015-11-09 MED ORDER — DIPHENHYDRAMINE HCL 25 MG PO CAPS: 25.0000 mg | ORAL_CAPSULE | Freq: Four times a day (QID) | ORAL | Status: DC | PRN

## 2015-11-09 MED ORDER — PREDNISONE 20 MG PO TABS
60.0000 mg | ORAL_TABLET | Freq: Once | ORAL | Status: AC
  Administered 2015-11-09: 60 mg via ORAL
  Filled 2015-11-09: qty 3

## 2015-11-09 MED ORDER — RANITIDINE HCL 150 MG PO CAPS: 150.0000 mg | ORAL_CAPSULE | Freq: Every day | ORAL | Status: DC

## 2015-11-09 MED ORDER — EPINEPHRINE 0.3 MG/0.3ML IJ SOAJ: 0.3000 mg | Freq: Once | INTRAMUSCULAR | Status: DC

## 2015-11-09 NOTE — ED Provider Notes (Signed)
CSN: [id number]     Arrival date & time 11/09/15  1448 History   First MD Initiated Contact with Patient 11/09/15 1503     Chief Complaint  Patient presents with  . Allergic Reaction     (Consider location/radiation/quality/duration/timing/severity/associated sxs/prior Treatment) Patient is a 30 y.o. female presenting with allergic reaction.  Allergic Reaction Presenting symptoms: difficulty breathing (shortness of breath), rash, swelling and wheezing   Presenting symptoms: no difficulty swallowing and no itching   Context: food     Kristen Chung is a 30 y.o. 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  Patient states she was at a [employer] seafood delight when she unknowingly ate celery.  Patient reports she suddenly became short of breath, experienced a dry cough, facial swelling, and hives to her lower legs.  She took Benadryl and reports that it has helped her cough, but she still feels slightly short of breath and has facial swelling.  Associated symptoms include lightheadedness.  She denies syncope, N/V/D, abdominal pain, CP, or throat/tongue swelling.    Per chart review: patient had confirmed IUP 09/14/15.  She states that she had an abortion 10/11/15.  Past Medical History  Diagnosis Date  . Crohn disease ([place])   . Pregnancy induced hypertension   . Preterm labor   . Heart murmur   . Urinary tract infection   . Kidney stone   . Anxiety   . Depression     No specific treatment  . PID (acute pelvic inflammatory disease)   . Chlamydia   . Gonorrhea   . Genital warts    Past Surgical History  Procedure Laterality Date  . Abortion      x2   Family History  Problem Relation Age of Onset  . Anesthesia problems Neg Hx   . Asthma Mother   . Heart disease Mother   . Diabetes Mother    . Hypertension Mother   . Cancer Father     Breast  . Asthma Sister   . Depression Sister   . Diabetes Sister   . Hypertension Sister   . Diabetes Other    Social History  Substance Use Topics  . Smoking status: Current Every Day Smoker -- 0.25 packs/day for 6 years    Types: Cigarettes  . Smokeless tobacco: None  . Alcohol Use: Yes     Comment: 2-3 drinks/wk   OB History    Gravida Para Term Preterm AB TAB SAB Ectopic Multiple Living   5 2 1 1 2 2  0 0 0 2     Review of Systems  Constitutional: Negative for fever.  HENT: Positive for facial swelling. Negative for trouble swallowing.   Respiratory: Positive for cough, shortness of breath and wheezing. Negative for chest tightness.   Cardiovascular: Negative for chest pain.  Gastrointestinal: Negative for nausea, vomiting, abdominal pain and diarrhea.  Skin: Positive for rash. Negative for itching.  Neurological: Positive for light-headedness. Negative for dizziness and syncope.  All other systems reviewed and are negative.   Allergies  Celery oil; Codone; Dilaudid; and Strawberry extract  Home Medications   Prior to Admission medications   Medication Sig Start Date End Date Taking? Authorizing Provider  ibuprofen (ADVIL,MOTRIN) 200 MG tablet Take 800 mg by mouth every 6 (six) hours as needed for moderate pain.   Yes Historical Provider, MD  BP 157/111 mmHg  Pulse 65  Temp(Src) 97.7 F (36.5 C) (Oral)  Resp 18  Ht 5' 3"  (1.6 m)  Wt 64.864 kg  BMI 25.34 kg/m2  SpO2 100%  LMP 08/06/2015 (Approximate)  Breastfeeding? Unknown Physical Exam  Constitutional: She is oriented to person, place, and time. She appears well-developed and well-nourished.  Non-toxic appearance. She does not have a sickly appearance. She does not appear ill.  Patient sitting in bed laughing with friend.   HENT:  Head: Normocephalic and atraumatic.  Mouth/Throat: Oropharynx is clear and moist.  Mild facial swelling.  No tongue or  oropharyngeal swelling.  Airway patent. Tolerating secretions without difficulty.   Eyes: Conjunctivae are normal. Pupils are equal, round, and reactive to light.  Neck: Normal range of motion. Neck supple.  Cardiovascular: Normal rate, regular rhythm and normal heart sounds.   No murmur heard. Pulmonary/Chest: Effort normal and breath sounds normal. No accessory muscle usage or stridor. No respiratory distress. She has no wheezes. She has no rhonchi. She has no rales.  Airway patent, able to speak in clear full sentences without difficulty.  100% oxygen saturation on RA.  No signs of respiratory distress. No audible wheezing.   Abdominal: Soft. Bowel sounds are normal. She exhibits no distension. There is no tenderness.  Musculoskeletal: Normal range of motion.  Lymphadenopathy:    She has no cervical adenopathy.  Neurological: She is alert and oriented to person, place, and time.  Speech clear without dysarthria.  Skin: Skin is warm and dry.  No signs of urticaria.   Psychiatric: She has a normal mood and affect. Her behavior is normal.    ED Course  Procedures (including critical care time) Labs Review Labs Reviewed - No data to display  Imaging Review No results found. I have personally reviewed and evaluated these images and lab results as part of my medical decision-making.   EKG Interpretation None      MDM   Final diagnoses:  Allergic reaction, initial encounter   Patient presents with allergic reaction that began 2 days ago.  VSS, NAD.  BP 157/111; hx of HTN, asymptomatic. On exam, airway patent, able to speak in full sentences without difficulty.  No audible wheezing.  Mild facial swelling.  No tongue or throat swelling.  No urticaria.  Heart RRR, lungs CTAB, abdomen soft and benign.  Hcg confirmed no pregnancy.  Patient given Benadryl, Zantac, and PO Prednisone in ED.  Patient monitored in ED for 2 hours with no signs of worsening symptoms.  Plan to discharge home with  prednisone, benadryl, and zantac as well as epi pen.  Patient given follow up with allergist for testing. Discussed return precautions.  Patient agrees and acknowledges the above plan for discharge.   Case has been discussed with Dr. Audie Pinto who agrees with the above plan for discharge.      Gloriann Loan, PA-C 11/09/15 1709  Leonard Schwartz, MD 11/12/15 [phone]

## 2015-11-09 NOTE — ED Notes (Signed)
Discharge instructions, follow up care, and prescriptions reviewed with patient. Patient verbalized understanding.

## 2015-11-09 NOTE — ED Notes (Signed)
Patient states she is allergic to celery and that 2 days ago she ate celery without realizing it. Patient states that at that time she became short of breath, had a cough, and facial swelling. Patient reports taking benadryl and unsure of the other medication that seemed to help with her symptoms. Patient states she still has slight facial swelling, hives to bilateral legs, and a cough. Patient alert, oriented, and speaking in full sentences.

## 2015-11-09 NOTE — ED Notes (Signed)
Pt drinking water.

## 2015-11-09 NOTE — Discharge Instructions (Signed)
Anaphylactic Reaction An anaphylactic reaction is a sudden, severe allergic reaction that involves the whole body. It can be life threatening. A hospital stay is often required. People with asthma, eczema, or hay fever are slightly more likely to have an anaphylactic reaction. CAUSES  An anaphylactic reaction may be caused by anything to which you are allergic. After being exposed to the allergic substance, your immune system becomes sensitized to it. When you are exposed to that allergic substance again, an allergic reaction can occur. Common causes of an anaphylactic reaction include:  Medicines.  Foods, especially peanuts, wheat, shellfish, milk, and eggs.  Insect bites or stings.  Blood products.  Chemicals, such as dyes, latex, and contrast material used for imaging tests. SYMPTOMS  When an allergic reaction occurs, the body releases histamine and other substances. These substances cause symptoms such as tightening of the airway. Symptoms often develop within seconds or minutes of exposure. Symptoms may include:  Skin rash or hives.  Itching.  Chest tightness.  Swelling of the eyes, tongue, or lips.  Trouble breathing or swallowing.  Lightheadedness or fainting.  Anxiety or confusion.  Stomach pains, vomiting, or diarrhea.  Nasal congestion.  A fast or irregular heartbeat (palpitations). DIAGNOSIS  Diagnosis is based on your history of recent exposure to allergic substances, your symptoms, and a physical exam. Your caregiver may also perform blood or urine tests to confirm the diagnosis. TREATMENT  Epinephrine medicine is the main treatment for an anaphylactic reaction. Other medicines that may be used for treatment include antihistamines, steroids, and albuterol. In severe cases, fluids and medicine to support blood pressure may be given through an intravenous line (IV). Even if you improve after treatment, you need to be observed to make sure your condition does not get  worse. This may require a stay in the hospital. HOME CARE INSTRUCTIONS   Wear a medical alert bracelet or necklace stating your allergy.  You and your family must learn how to use an anaphylaxis kit or give an epinephrine injection to temporarily treat an emergency allergic reaction. Always carry your epinephrine injection or anaphylaxis kit with you. This can be lifesaving if you have a severe reaction.  Do not drive or perform tasks after treatment until the medicines used to treat your reaction have worn off, or until your caregiver says it is okay.  If you have hives or a rash:  Take medicines as directed by your caregiver.  You may use an over-the-counter antihistamine (diphenhydramine) as needed.  Apply cold compresses to the skin or take baths in cool water. Avoid hot baths or showers. SEEK MEDICAL CARE IF:   You develop symptoms of an allergic reaction to a new substance. Symptoms may start right away or minutes later.  You develop a rash, hives, or itching.  You develop new symptoms. SEEK IMMEDIATE MEDICAL CARE IF:   You have swelling of the mouth, difficulty breathing, or wheezing.  You have a tight feeling in your chest or throat.  You develop hives, swelling, or itching all over your body.  You develop severe vomiting or diarrhea.  You feel faint or pass out. This is an emergency. Use your epinephrine injection or anaphylaxis kit as you have been instructed. Call your local emergency services (911 in [place] Even if you improve after the injection, you need to be examined at a hospital emergency department. MAKE SURE YOU:   Understand these instructions.  Will watch your condition.  Will get help right away if you are not   you are not doing well or get worse. This information is not intended to replace advice given to you by your health care provider. Make sure you discuss any questions you have with your health care provider.  Document Released: 07/09/2005 Document Revised: 07/14/2013 Document  Reviewed: 01/19/2015  Elsevier Interactive Patient Education [employer].

## 2015-11-13 ENCOUNTER — Encounter (HOSPITAL_COMMUNITY): Payer: Self-pay | Admitting: *Deleted

## 2015-11-13 ENCOUNTER — Emergency Department (HOSPITAL_COMMUNITY)
Admission: EM | Admit: 2015-11-13 | Discharge: 2015-11-13 | Disposition: A | Discharge status: Home / Self Care | Payer: Self-pay | Attending: Emergency Medicine | Admitting: Emergency Medicine

## 2015-11-13 ENCOUNTER — Emergency Department (HOSPITAL_COMMUNITY): Payer: Self-pay

## 2015-11-13 DIAGNOSIS — Z7952 Long term (current) use of systemic steroids: Secondary | ICD-10-CM | POA: Insufficient documentation

## 2015-11-13 DIAGNOSIS — R1031 Right lower quadrant pain: Secondary | ICD-10-CM | POA: Insufficient documentation

## 2015-11-13 DIAGNOSIS — Z79899 Other long term (current) drug therapy: Secondary | ICD-10-CM | POA: Insufficient documentation

## 2015-11-13 DIAGNOSIS — Z8619 Personal history of other infectious and parasitic diseases: Secondary | ICD-10-CM | POA: Insufficient documentation

## 2015-11-13 DIAGNOSIS — J020 Streptococcal pharyngitis: Secondary | ICD-10-CM | POA: Insufficient documentation

## 2015-11-13 DIAGNOSIS — F17210 Nicotine dependence, cigarettes, uncomplicated: Secondary | ICD-10-CM | POA: Insufficient documentation

## 2015-11-13 DIAGNOSIS — Z8751 Personal history of pre-term labor: Secondary | ICD-10-CM | POA: Insufficient documentation

## 2015-11-13 DIAGNOSIS — Z8742 Personal history of other diseases of the female genital tract: Secondary | ICD-10-CM | POA: Insufficient documentation

## 2015-11-13 DIAGNOSIS — R011 Cardiac murmur, unspecified: Secondary | ICD-10-CM | POA: Insufficient documentation

## 2015-11-13 DIAGNOSIS — Z3202 Encounter for pregnancy test, result negative: Secondary | ICD-10-CM | POA: Insufficient documentation

## 2015-11-13 DIAGNOSIS — Z87440 Personal history of urinary (tract) infections: Secondary | ICD-10-CM | POA: Insufficient documentation

## 2015-11-13 DIAGNOSIS — Z87442 Personal history of urinary calculi: Secondary | ICD-10-CM | POA: Insufficient documentation

## 2015-11-13 DIAGNOSIS — Z8719 Personal history of other diseases of the digestive system: Secondary | ICD-10-CM | POA: Insufficient documentation

## 2015-11-13 DIAGNOSIS — Z8659 Personal history of other mental and behavioral disorders: Secondary | ICD-10-CM | POA: Insufficient documentation

## 2015-11-13 LAB — URINE MICROSCOPIC-ADD ON: WBC, UA: NONE SEEN WBC/hpf (ref 0–5)

## 2015-11-13 LAB — URINALYSIS, ROUTINE W REFLEX MICROSCOPIC
BILIRUBIN URINE: NEGATIVE
GLUCOSE, UA: NEGATIVE mg/dL
KETONES UR: NEGATIVE mg/dL
Leukocytes, UA: NEGATIVE
Nitrite: NEGATIVE
PROTEIN: 30 mg/dL — AB
Specific Gravity, Urine: 1.025 (ref 1.005–1.030)
pH: 7.5 (ref 5.0–8.0)

## 2015-11-13 LAB — COMPREHENSIVE METABOLIC PANEL
ALBUMIN: 4.1 g/dL (ref 3.5–5.0)
ALK PHOS: 60 U/L (ref 38–126)
ALT: 17 U/L (ref 14–54)
AST: 20 U/L (ref 15–41)
Anion gap: 11 (ref 5–15)
BUN: 5 mg/dL — AB (ref 6–20)
CALCIUM: 9 mg/dL (ref 8.9–10.3)
CO2: 23 mmol/L (ref 22–32)
CREATININE: 0.87 mg/dL (ref 0.44–1.00)
Chloride: 102 mmol/L (ref 101–111)
GFR calc Af Amer: >60 mL/min (ref >60)
GFR calc non Af Amer: >60 mL/min (ref >60)
GLUCOSE: 109 mg/dL — AB (ref 65–99)
Potassium: 3 mmol/L — ABNORMAL LOW (ref 3.5–5.1)
SODIUM: 136 mmol/L (ref 135–145)
Total Bilirubin: 0.7 mg/dL (ref 0.3–1.2)
Total Protein: 7.6 g/dL (ref 6.5–8.1)

## 2015-11-13 LAB — CBC
HCT: 37.8 % (ref 36.0–46.0)
HEMOGLOBIN: 12.2 g/dL (ref 12.0–15.0)
MCH: 27.9 pg (ref 26.0–34.0)
MCHC: 32.3 g/dL (ref 30.0–36.0)
MCV: 86.5 fL (ref 78.0–100.0)
PLATELETS: 275 10*3/uL (ref 150–400)
RBC: 4.37 MIL/uL (ref 3.87–5.11)
RDW: 13.1 % (ref 11.5–15.5)
WBC: 21.6 10*3/uL — ABNORMAL HIGH (ref 4.0–10.5)

## 2015-11-13 LAB — LIPASE, BLOOD: Lipase: 18 U/L (ref 11–51)

## 2015-11-13 LAB — RAPID STREP SCREEN (MED CTR MEBANE ONLY): Streptococcus, Group A Screen (Direct): POSITIVE — AB

## 2015-11-13 LAB — POC URINE PREG, ED: Preg Test, Ur: NEGATIVE

## 2015-11-13 MED ORDER — PENICILLIN G BENZATHINE 1200000 UNIT/2ML IM SUSP
1.2000 10*6.[IU] | Freq: Once | INTRAMUSCULAR | Status: AC
  Administered 2015-11-13: 1.2 10*6.[IU] via INTRAMUSCULAR
  Filled 2015-11-13: qty 2

## 2015-11-13 MED ORDER — KETOROLAC TROMETHAMINE 30 MG/ML IJ SOLN
30.0000 mg | Freq: Once | INTRAMUSCULAR | Status: AC
  Administered 2015-11-13: 30 mg via INTRAVENOUS
  Filled 2015-11-13: qty 1

## 2015-11-13 MED ORDER — IOPAMIDOL (ISOVUE-300) INJECTION 61%
100.0000 mL | Freq: Once | INTRAVENOUS | Status: AC | PRN
  Administered 2015-11-13: 100 mL via INTRAVENOUS

## 2015-11-13 MED ORDER — DIATRIZOATE MEGLUMINE & SODIUM 66-10 % PO SOLN
15.0000 mL | Freq: Once | ORAL | Status: AC
Start: 2015-11-13 — End: 2015-11-13
  Administered 2015-11-13: 30 mL via ORAL

## 2015-11-13 MED ORDER — SODIUM CHLORIDE 0.9 % IV BOLUS (SEPSIS)
1000.0000 mL | Freq: Once | INTRAVENOUS | Status: AC
  Administered 2015-11-13: 1000 mL via INTRAVENOUS

## 2015-11-13 MED ORDER — NAPROXEN 500 MG PO TABS: 500.0000 mg | ORAL_TABLET | Freq: Two times a day (BID) | ORAL | Status: DC

## 2015-11-13 MED ORDER — ACETAMINOPHEN 325 MG PO TABS
650.0000 mg | ORAL_TABLET | Freq: Once | ORAL | Status: AC | PRN
  Administered 2015-11-13: 650 mg via ORAL
  Filled 2015-11-13: qty 2

## 2015-11-13 NOTE — ED Provider Notes (Signed)
CSN: [id number]     Arrival date & time 11/13/15  1444 History   First MD Initiated Contact with Patient 11/13/15 1553     Chief Complaint  Patient presents with  . Sore Throat  . Abdominal Pain  . Weakness     (Consider location/radiation/quality/duration/timing/severity/associated sxs/prior Treatment) HPI Kristen Chung is a 30 y.o. female history of PID, STD, reported recent abortion on March 21; 10 weeks with D&C. Comes in for evaluation of sore throat, abdominal pain. She reports she had a mild headache that started yesterday and has progressively worsened, she also reports a sore throat last night and has gradually worsened into today. She noticed constant lower abdominal pain that started this morning. She reports intermittent hot flashes and chills, decreased appetite, intermittent urinary hesitancy but no dysuria. She reports she just started her menstrual cycle yesterday and is normal. She denies any other back pain, chest pain, rash, shortness of breath, cough.  Past Medical History  Diagnosis Date  . Crohn disease ([place])   . Pregnancy induced hypertension   . Preterm labor   . Heart murmur   . Urinary tract infection   . Kidney stone   . Anxiety   . Depression     No specific treatment  . PID (acute pelvic inflammatory disease)   . Chlamydia   . Gonorrhea   . Genital warts    Past Surgical History  Procedure Laterality Date  . Abortion      x2   Family History  Problem Relation Age of Onset  . Anesthesia problems Neg Hx   . Asthma Mother   . Heart disease Mother   . Diabetes Mother   . Hypertension Mother   . Cancer Father     Breast  . Asthma Sister   . Depression Sister   . Diabetes Sister   . Hypertension Sister   . Diabetes Other    Social History  Substance Use Topics  . Smoking status: Current Every Day Smoker -- 0.25 packs/day for 6 years    Types: Cigarettes  . Smokeless tobacco: None  . Alcohol Use: Yes     Comment: 2-3 drinks/wk   OB  History    Gravida Para Term Preterm AB TAB SAB Ectopic Multiple Living   5 2 1 1 2 2  0 0 0 2     Review of Systems A 10 point review of systems was completed and was negative except for pertinent positives and negatives as mentioned in the history of present illness     Allergies  Celery oil; Codone; Dilaudid; and Strawberry extract  Home Medications   Prior to Admission medications   Medication Sig Start Date End Date Taking? Authorizing Provider  diphenhydrAMINE (BENADRYL) 25 mg capsule Take 1 capsule (25 mg total) by mouth every 6 (six) hours as needed. 11/09/15  Yes Kayla Rose, PA-C  EPINEPHrine 0.3 mg/0.3 mL IJ SOAJ injection Inject 0.3 mLs (0.3 mg total) into the muscle once. 11/09/15  Yes Kayla Rose, PA-C  ibuprofen (ADVIL,MOTRIN) 200 MG tablet Take 800 mg by mouth every 6 (six) hours as needed for moderate pain.   Yes Historical Provider, MD  naproxen (NAPROSYN) 500 MG tablet Take 1 tablet (500 mg total) by mouth 2 (two) times daily. 11/13/15   Comer Locket, PA-C  predniSONE (DELTASONE) 50 MG tablet Take 1 tablet (50 mg total) by mouth daily. 11/09/15   Gloriann Loan, PA-C  ranitidine (ZANTAC) 150 MG capsule Take 1 capsule (150 mg total)  by mouth daily. 11/09/15   Kayla Rose, PA-C   BP 149/101 mmHg  Pulse 85  Temp(Src) 99.2 F (37.3 C) (Oral)  Resp 18  SpO2 100%  LMP 11/13/2015 Physical Exam  Constitutional: She is oriented to person, place, and time. She appears well-developed and well-nourished.  HENT:  Head: Normocephalic and atraumatic.  Mouth/Throat: Oropharynx is clear and moist.  Right tonsil exudate but no unilateral tonsillar swelling. Uvula is midline. No glossal elevation or trismus. Mildly erythematous posterior oropharynx.  Eyes: Conjunctivae are normal. Pupils are equal, round, and reactive to light. Right eye exhibits no discharge. Left eye exhibits no discharge. No scleral icterus.  Neck: Neck supple.  Cardiovascular: Normal rate, regular rhythm and normal  heart sounds.   Pulmonary/Chest: Effort normal and breath sounds normal. No respiratory distress. She has no wheezes. She has no rales.  Abdominal: Soft.  Diffuse tenderness in right lower quadrant with rebound tenderness. No distention, rashes or other lesions  Musculoskeletal: She exhibits no tenderness.  Neurological: She is alert and oriented to person, place, and time.  Cranial Nerves II-XII grossly intact  Skin: Skin is warm and dry. No rash noted.  Psychiatric: She has a normal mood and affect.  Nursing note and vitals reviewed.   ED Course  Procedures (including critical care time) Labs Review Labs Reviewed  RAPID STREP SCREEN (NOT AT [facility]) - Abnormal; Notable for the following:    Streptococcus, Group A Screen (Direct) POSITIVE (*)    All other components within normal limits  COMPREHENSIVE METABOLIC PANEL - Abnormal; Notable for the following:    Potassium 3.0 (*)    Glucose, Bld 109 (*)    BUN 5 (*)    All other components within normal limits  CBC - Abnormal; Notable for the following:    WBC 21.6 (*)    All other components within normal limits  URINALYSIS, ROUTINE W REFLEX MICROSCOPIC (NOT AT [facility]) - Abnormal; Notable for the following:    APPearance CLOUDY (*)    Hgb urine dipstick SMALL (*)    Protein, ur 30 (*)    All other components within normal limits  URINE MICROSCOPIC-ADD ON - Abnormal; Notable for the following:    Squamous Epithelial / LPF 6-30 (*)    Bacteria, UA RARE (*)    All other components within normal limits  LIPASE, BLOOD  POC URINE PREG, ED    Imaging Review Ct Abdomen Pelvis W Contrast  11/13/2015  CLINICAL DATA:  Sore throat with chills, headache, low abdominal pain, nausea, diarrhea and weakness for 1 day. EXAM: CT ABDOMEN AND PELVIS WITH CONTRAST TECHNIQUE: Multidetector CT imaging of the abdomen and pelvis was performed using the standard protocol following bolus administration of intravenous contrast. CONTRAST:  159m ISOVUE-300  IOPAMIDOL (ISOVUE-300) INJECTION 61% COMPARISON:  CT urogram 01/31/2013 M FINDINGS: Lower chest: Clear lung bases. No significant pleural or pericardial effusion. Hepatobiliary: The liver is normal in density without focal abnormality. No evidence of gallstones, gallbladder wall thickening or biliary dilatation. Pancreas: Unremarkable. No pancreatic ductal dilatation or surrounding inflammatory changes. Spleen: Normal in size without focal abnormality. Adrenals/Urinary Tract: Both adrenal glands appear normal. The kidneys appear normal without evidence of urinary tract calculus, suspicious lesion or hydronephrosis. No bladder abnormalities are seen. Stomach/Bowel: No evidence of bowel wall thickening, distention or surrounding inflammatory change. The appendix appears normal. The colon is decompressed and not opacified with contrast but without definite inflammatory change. Vascular/Lymphatic: There are no enlarged abdominal or pelvic lymph nodes. No significant  vascular findings are present. Reproductive: The uterus and ovaries appear normal. No evidence of adnexal mass or pelvic inflammatory process. Pelvic phleboliths noted Other: No evidence of abdominal wall mass or hernia. Musculoskeletal: No acute or significant osseous findings. 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 IMPRESSION: No acute findings or explanation for the patient's symptoms. No evidence of appendicitis. Electronically Signed   By: Richardean Sale M.D.   On: 11/13/2015 18:08   I have personally reviewed and evaluated these images and lab results as part of my medical decision-making.   EKG Interpretation None     Meds given in ED:  Medications  acetaminophen (TYLENOL) tablet 650 mg (650 mg Oral Given 11/13/15 1514)  ketorolac (TORADOL) 30 MG/ML injection 30 mg (30 mg Intravenous Given 11/13/15 1618)  sodium chloride 0.9 % bolus 1,000 mL (0 mLs Intravenous Stopped 11/13/15 1845)  diatrizoate meglumine-sodium (GASTROGRAFIN) 66-10 %  solution 15 mL (30 mLs Oral Given 11/13/15 1755)  iopamidol (ISOVUE-300) 61 % injection 100 mL (100 mLs Intravenous Contrast Given 11/13/15 1738)  penicillin g benzathine (BICILLIN LA) 1200000 UNIT/2ML injection 1.2 Million Units (1.2 Million Units Intramuscular Given 11/13/15 1845)    Discharge Medication List as of 11/13/2015  6:33 PM    START taking these medications   Details  naproxen (NAPROSYN) 500 MG tablet Take 1 tablet (500 mg total) by mouth 2 (two) times daily., Starting 11/13/2015, Until Discontinued, Print       Filed Vitals:   11/13/15 1502 11/13/15 1600 11/13/15 1733 11/13/15 1752  BP: 172/114   149/101  Pulse: 107   85  Temp:  102 F (38.9 C) 99.2 F (37.3 C)   TempSrc:      Resp: 18   18  SpO2: 100%   100%    MDM  Jozalynn L Barrick is a 30 y.o. female who comes in for evaluation of one to 2 day history of sore throat and abdominal pain. Arrival she is febrile at 102.42F, given Tylenol. She is slightly tachycardic, but this resolved on my exam. She is rapid strep positive and does have tonsillar exudate. She also has tenderness in her right lower quadrant. Given the leukocytosis of 21.6 right lower quadrant pain, we will obtain CT abdomen for further evaluation of possible appendicitis or other intra-abdominal pathology. Upon reevaluation at 18:20-patient has sleeping comfortably in exam room. Temperature 99.2. Remains hemodynamically stable CT abdomen is negative for any acute intra-abdominal pathology. Will treat strep pharyngitis in the emergency department with Bicillin. Discussed follow-up with PCP, return precautions. She verbalizes understanding and agrees to this plan as well as subsequent discharge. Voices no other questions or concerns at this time. Prior to patient discharge, I discussed and reviewed this case with Dr. Lita Mains  Final diagnoses:  Strep pharyngitis       Comer Locket, PA-C 11/13/15 2001  Julianne Rice, MD 11/21/15 2303

## 2015-11-13 NOTE — ED Notes (Addendum)
Pt complains of sore throat, chills, headache, lower abdominal pain nausea, diarhea and weakness since last night. Pt denies vomiting.

## 2015-11-13 NOTE — Discharge Instructions (Signed)
You were treated for your strep throat with antibiotics in the emergency department. It is important to stay well hydrated and drink plenty of water, Gatorade or other electrolyte balanced solution. Take naproxen or Tylenol for your discomfort and fever. Follow-up with your doctor this week for reevaluation. Return to ED for new or worsening symptoms as discussed.  Strep Throat Strep throat is an infection of the throat. It is caused by germs. Strep throat spreads from person to person because of coughing, sneezing, or close contact. HOME CARE Medicines  Take over-the-counter and prescription medicines only as told by your doctor.  Take your antibiotic medicine as told by your doctor. Do not stop taking the medicine even if you feel better.  Have family members who also have a sore throat or fever go to a doctor. Eating and Drinking  Do not share food, drinking cups, or personal items.  Try eating soft foods until your sore throat feels better.  Drink enough fluid to keep your pee (urine) clear or pale yellow. General Instructions  Rinse your mouth (gargle) with a salt-water mixture 3-4 times per day or as needed. To make a salt-water mixture, stir -1 tsp of salt into 1 cup of warm water.  Make sure that all people in your house wash their hands well.  Rest.  Stay home from school or work until you have been taking antibiotics for 24 hours.  Keep all follow-up visits as told by your doctor. This is important. GET HELP IF:  Your neck keeps getting bigger.  You get a rash, cough, or earache.  You cough up thick liquid that is green, yellow-brown, or bloody.  You have pain that does not get better with medicine.  Your problems get worse instead of getting better.  You have a fever. GET HELP RIGHT AWAY IF:  You throw up (vomit).  You get a very bad headache.  You neck hurts or it feels stiff.  You have chest pain or you are short of breath.  You have drooling, very  bad throat pain, or changes in your voice.  Your neck is swollen or the skin gets red and tender.  Your mouth is dry or you are peeing less than normal.  You keep feeling more tired or it is hard to wake up.  Your joints are red or they hurt.   This information is not intended to replace advice given to you by your health care provider. Make sure you discuss any questions you have with your health care provider.   Document Released: 12/26/2007 Document Revised: 03/30/2015 Document Reviewed: 11/01/2014 Elsevier Interactive Patient Education [employer].

## 2016-07-01 ENCOUNTER — Emergency Department (HOSPITAL_BASED_OUTPATIENT_CLINIC_OR_DEPARTMENT_OTHER)
Admission: EM | Admit: 2016-07-01 | Discharge: 2016-07-02 | Disposition: A | Discharge status: Home / Self Care | Payer: Self-pay | Attending: Emergency Medicine | Admitting: Emergency Medicine

## 2016-07-01 ENCOUNTER — Emergency Department (HOSPITAL_BASED_OUTPATIENT_CLINIC_OR_DEPARTMENT_OTHER): Payer: Self-pay

## 2016-07-01 ENCOUNTER — Encounter (HOSPITAL_BASED_OUTPATIENT_CLINIC_OR_DEPARTMENT_OTHER): Payer: Self-pay | Admitting: *Deleted

## 2016-07-01 DIAGNOSIS — Z79899 Other long term (current) drug therapy: Secondary | ICD-10-CM | POA: Insufficient documentation

## 2016-07-01 DIAGNOSIS — R079 Chest pain, unspecified: Secondary | ICD-10-CM | POA: Insufficient documentation

## 2016-07-01 DIAGNOSIS — Z791 Long term (current) use of non-steroidal anti-inflammatories (NSAID): Secondary | ICD-10-CM | POA: Insufficient documentation

## 2016-07-01 DIAGNOSIS — F17210 Nicotine dependence, cigarettes, uncomplicated: Secondary | ICD-10-CM | POA: Insufficient documentation

## 2016-07-01 LAB — CBC WITH DIFFERENTIAL/PLATELET
BASOS PCT: 0 %
Basophils Absolute: 0 10*3/uL (ref 0.0–0.1)
EOS ABS: 0 10*3/uL (ref 0.0–0.7)
Eosinophils Relative: 1 %
HCT: 37.2 % (ref 36.0–46.0)
Hemoglobin: 12 g/dL (ref 12.0–15.0)
Lymphocytes Relative: 41 %
Lymphs Abs: 2.3 10*3/uL (ref 0.7–4.0)
MCH: 28.2 pg (ref 26.0–34.0)
MCHC: 32.3 g/dL (ref 30.0–36.0)
MCV: 87.3 fL (ref 78.0–100.0)
MONO ABS: 0.6 10*3/uL (ref 0.1–1.0)
MONOS PCT: 11 %
Neutro Abs: 2.6 10*3/uL (ref 1.7–7.7)
Neutrophils Relative %: 47 %
Platelets: 223 10*3/uL (ref 150–400)
RBC: 4.26 MIL/uL (ref 3.87–5.11)
RDW: 13.2 % (ref 11.5–15.5)
WBC: 5.5 10*3/uL (ref 4.0–10.5)

## 2016-07-01 LAB — LIPASE, BLOOD: Lipase: 26 U/L (ref 11–51)

## 2016-07-01 LAB — COMPREHENSIVE METABOLIC PANEL
ALBUMIN: 3.8 g/dL (ref 3.5–5.0)
ALT: 12 U/L — ABNORMAL LOW (ref 14–54)
ANION GAP: 7 (ref 5–15)
AST: 18 U/L (ref 15–41)
Alkaline Phosphatase: 35 U/L — ABNORMAL LOW (ref 38–126)
BUN: 12 mg/dL (ref 6–20)
CO2: 24 mmol/L (ref 22–32)
Calcium: 9.3 mg/dL (ref 8.9–10.3)
Chloride: 108 mmol/L (ref 101–111)
Creatinine, Ser: 0.73 mg/dL (ref 0.44–1.00)
GFR calc Af Amer: >60 mL/min (ref >60)
GFR calc non Af Amer: >60 mL/min (ref >60)
GLUCOSE: 86 mg/dL (ref 65–99)
Potassium: 3.2 mmol/L — ABNORMAL LOW (ref 3.5–5.1)
SODIUM: 139 mmol/L (ref 135–145)
TOTAL PROTEIN: 6.9 g/dL (ref 6.5–8.1)
Total Bilirubin: 0.4 mg/dL (ref 0.3–1.2)

## 2016-07-01 LAB — TROPONIN I: Troponin I: <0.03 ng/mL (ref <0.03)

## 2016-07-01 NOTE — ED Triage Notes (Signed)
C/o sternal CP, on and off for last 3 weeks, not seen for same previously, denies other sx, moved back to [place] after being gone for 6 months, amiable, alert, NAD, calm, interactive, no dyspnea noted, family present.

## 2016-07-01 NOTE — ED Notes (Signed)
Pt states the pain in her chest hurts worse with inspiration, movements such as reaching.

## 2016-07-01 NOTE — ED Provider Notes (Signed)
[place] DEPT [facility] Provider Note   CSN: [id number] Arrival date & time: 07/01/16  2149 By signing my name below, I, Georgette Shell, attest that this documentation has been prepared under the direction and in the presence of Dorie Rank, MD. Electronically Signed: Georgette Shell, ED Scribe. 07/01/16. 10:36 PM.  History   Chief Complaint Chief Complaint  Patient presents with  . Chest Pain   HPI Comments: Kristen Chung is a 30 y.o. female with h/o Crohn's disease and PID who presents to the Emergency Department complaining of intermittent sharp, centralized chest pain onset three weeks ago, worsening this week. Her pain is exacerbated with palpation, movement, and deep breathing. She has not tried any OTC medications PTA.  She denies h/o similar symptoms. Pt further denies fever, chills, or any other associated symptoms.   The history is provided by the patient. No language interpreter was used.    Past Medical History:  Diagnosis Date  . Anxiety   . Chlamydia   . Crohn disease ([place])   . Depression    No specific treatment  . Genital warts   . Gonorrhea   . Heart murmur   . Kidney stone   . PID (acute pelvic inflammatory disease)   . Pregnancy induced hypertension   . Preterm labor   . Urinary tract infection     Patient Active Problem List   Diagnosis Date Noted  . Breast pain in female 05/13/2013  . Crohn disease ([place]) 05/13/2013  . Cocaine substance abuse 05/13/2013  . Tobacco abuse counseling 05/13/2013  . Implanon in place 05/13/2013  . Contraception management 05/13/2013  . Health maintenance examination 05/13/2013  . Difficulty swallowing 05/13/2013  . 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 abortion     x2    OB History    Gravida Para Term Preterm AB Living   5 2 1 1 2 2    SAB TAB Ectopic Multiple Live Births   0 2 0 0 2       Home Medications    Prior to Admission medications     Medication Sig Start Date End Date Taking? Authorizing Provider  diphenhydrAMINE (BENADRYL) 25 mg capsule Take 1 capsule (25 mg total) by mouth every 6 (six) hours as needed. 11/09/15   Gloriann Loan, PA-C  EPINEPHrine 0.3 mg/0.3 mL IJ SOAJ injection Inject 0.3 mLs (0.3 mg total) into the muscle once. 11/09/15   Gloriann Loan, PA-C  famotidine (PEPCID) 20 MG tablet Take 1 tablet (20 mg total) by mouth 2 (two) times daily. 07/02/16   Dorie Rank, MD  ibuprofen (ADVIL,MOTRIN) 200 MG tablet Take 800 mg by mouth every 6 (six) hours as needed for moderate pain.    Historical Provider, MD  naproxen (NAPROSYN) 500 MG tablet Take 1 tablet (500 mg total) by mouth 2 (two) times daily. 07/02/16   Dorie Rank, MD  predniSONE (DELTASONE) 50 MG tablet Take 1 tablet (50 mg total) by mouth daily. 11/09/15   Gloriann Loan, PA-C  ranitidine (ZANTAC) 150 MG capsule Take 1 capsule (150 mg total) by mouth daily. 11/09/15   Gloriann Loan, PA-C    Family History Family History  Problem Relation Age of Onset  . Asthma Mother   . Heart disease Mother   . Diabetes Mother   . Hypertension Mother   . Cancer Father     Breast  . Asthma Sister   . Depression Sister   .  Diabetes Sister   . Hypertension Sister   . Diabetes Other   . Anesthesia problems Neg Hx     Social History Social History  Substance Use Topics  . Smoking status: Current Every Day Smoker    Packs/day: 0.25    Years: 6.00    Types: Cigarettes  . Smokeless tobacco: Never Used  . Alcohol use Yes     Comment: 2-3 drinks/wk     Allergies   Celery oil; Codone [hydrocodone]; Dilaudid [hydromorphone hcl]; and Strawberry extract   Review of Systems Review of Systems  Constitutional: Negative for chills and fever.  Cardiovascular: Positive for chest pain.  All other systems reviewed and are negative.    Physical Exam Updated Vital Signs BP 153/98 (BP Location: Right Arm)   Pulse 64   Temp 98.6 F (37 C) (Oral)   Resp 20   Ht 5' 4"  (1.626 m)   Wt  68.9 kg   LMP 06/07/2016 (Exact Date)   SpO2 96%   BMI 26.09 kg/m   Physical Exam  Constitutional: She appears well-developed and well-nourished. No distress.  HENT:  Head: Normocephalic and atraumatic.  Right Ear: External ear normal.  Left Ear: External ear normal.  Eyes: Conjunctivae are normal. Right eye exhibits no discharge. Left eye exhibits no discharge. No scleral icterus.  Neck: Neck supple. No tracheal deviation present.  Cardiovascular: Normal rate, regular rhythm and intact distal pulses.   Pulmonary/Chest: Effort normal and breath sounds normal. No stridor. No respiratory distress. She has no wheezes. She has no rales. She exhibits no tenderness.  Abdominal: Soft. Bowel sounds are normal. She exhibits no distension. There is no tenderness. There is no rebound and no guarding.  Musculoskeletal: She exhibits no edema or tenderness.  Neurological: She is alert. She has normal strength. No cranial nerve deficit (no facial droop, extraocular movements intact, no slurred speech) or sensory deficit. She exhibits normal muscle tone. She displays no seizure activity. Coordination normal.  Skin: Skin is warm and dry. No rash noted.  Psychiatric: She has a normal mood and affect.  Nursing note and vitals reviewed.    ED Treatments / Results  DIAGNOSTIC STUDIES: Oxygen Saturation is 96% on RA, adequate by my interpretation.    COORDINATION OF CARE: 10:32 PM Discussed treatment plan with pt at bedside which includes blood work and pt agreed to plan.  Labs (all labs ordered are listed, but only abnormal results are displayed) Labs Reviewed  COMPREHENSIVE METABOLIC PANEL - Abnormal; Notable for the following:       Result Value   Potassium 3.2 (*)    ALT 12 (*)    Alkaline Phosphatase 35 (*)    All other components within normal limits  CBC WITH DIFFERENTIAL/PLATELET  LIPASE, BLOOD  TROPONIN I    EKG  EKG Interpretation  Date/Time:  Sunday July 01 2016 21:57:59  EST Ventricular Rate:  67 PR Interval:  150 QRS Duration: 82 QT Interval:  398 QTC Calculation: 420 R Axis:   80 Text Interpretation:  Normal sinus rhythm Normal ECG No significant change since last tracing Confirmed by Dream Harman  MD-J, Debbra Digiulio ([id number]) on 07/01/2016 10:00:39 PM       Radiology Dg Chest 2 View  Result Date: 07/01/2016 CLINICAL DATA:  Sternal pain on and off for 3 weeks EXAM: CHEST  2 VIEW COMPARISON:  None. FINDINGS: The heart size and mediastinal contours are within normal limits. Both lungs are clear. The visualized skeletal structures are unremarkable. IMPRESSION: No active cardiopulmonary  disease. Electronically Signed   By: Ashley Royalty M.D.   On: 07/01/2016 23:32    Procedures Procedures (including critical care time)  Medications Ordered in ED Medications  naproxen (NAPROSYN) tablet 500 mg (not administered)  famotidine (PEPCID) tablet 20 mg (not administered)     Initial Impression / Assessment and Plan / ED Course  I have reviewed the triage vital signs and the nursing notes.  Pertinent labs & imaging results that were available during my care of the patient were reviewed by me and considered in my medical decision making (see chart for details).  Clinical Course as of Jul 02 5  Sun Jul 01, 2016  2350 HCT: 37.2 [JK]    Clinical Course User Index [JK] Dorie Rank, MD   ED workup is reassuring.  Doubt acute cardiac issue.  Low risk for PE.  Perc negative ?pericarditis.  Will dc with nsaids.  Rx for pepcid although does not sound like GERD.  Discussed outpatient follow up with pcp  Final Clinical Impressions(s) / ED Diagnoses   Final diagnoses:  Chest pain, unspecified type    New Prescriptions New Prescriptions   FAMOTIDINE (PEPCID) 20 MG TABLET    Take 1 tablet (20 mg total) by mouth 2 (two) times daily.   NAPROXEN (NAPROSYN) 500 MG TABLET    Take 1 tablet (500 mg total) by mouth 2 (two) times daily.   I personally performed the services  described in this documentation, which was scribed in my presence.  The recorded information has been reviewed and is accurate.    Dorie Rank, MD 07/02/16 [phone]

## 2016-07-02 MED ORDER — FAMOTIDINE 20 MG PO TABS
20.0000 mg | ORAL_TABLET | Freq: Two times a day (BID) | ORAL | Status: DC
  Administered 2016-07-02: 20 mg via ORAL
  Filled 2016-07-02: qty 1

## 2016-07-02 MED ORDER — NAPROXEN 500 MG PO TABS: 500.0000 mg | ORAL_TABLET | Freq: Two times a day (BID) | ORAL | 0 refills | Status: DC

## 2016-07-02 MED ORDER — NAPROXEN 250 MG PO TABS
500.0000 mg | ORAL_TABLET | Freq: Once | ORAL | Status: AC
  Administered 2016-07-02: 500 mg via ORAL
  Filled 2016-07-02: qty 2

## 2016-07-02 MED ORDER — FAMOTIDINE 20 MG PO TABS: 20.0000 mg | ORAL_TABLET | Freq: Two times a day (BID) | ORAL | 0 refills | Status: DC

## 2016-07-02 NOTE — Discharge Instructions (Signed)
Follow-up with a primary care doctor. Take the medications as prescribed. Return as needed for worsening symptoms

## 2016-07-26 ENCOUNTER — Encounter (HOSPITAL_COMMUNITY): Payer: Self-pay

## 2016-07-26 ENCOUNTER — Emergency Department (HOSPITAL_COMMUNITY)
Admission: EM | Admit: 2016-07-26 | Discharge: 2016-07-26 | Disposition: A | Discharge status: Home / Self Care | Payer: Self-pay | Attending: Emergency Medicine | Admitting: Emergency Medicine

## 2016-07-26 DIAGNOSIS — N938 Other specified abnormal uterine and vaginal bleeding: Secondary | ICD-10-CM | POA: Insufficient documentation

## 2016-07-26 DIAGNOSIS — R102 Pelvic and perineal pain: Secondary | ICD-10-CM

## 2016-07-26 DIAGNOSIS — Z79899 Other long term (current) drug therapy: Secondary | ICD-10-CM | POA: Insufficient documentation

## 2016-07-26 DIAGNOSIS — R197 Diarrhea, unspecified: Secondary | ICD-10-CM | POA: Insufficient documentation

## 2016-07-26 DIAGNOSIS — R110 Nausea: Secondary | ICD-10-CM | POA: Insufficient documentation

## 2016-07-26 DIAGNOSIS — N939 Abnormal uterine and vaginal bleeding, unspecified: Secondary | ICD-10-CM

## 2016-07-26 DIAGNOSIS — F17210 Nicotine dependence, cigarettes, uncomplicated: Secondary | ICD-10-CM | POA: Insufficient documentation

## 2016-07-26 LAB — CBC
HCT: 38.9 % (ref 36.0–46.0)
HEMOGLOBIN: 12.5 g/dL (ref 12.0–15.0)
MCH: 28 pg (ref 26.0–34.0)
MCHC: 32.1 g/dL (ref 30.0–36.0)
MCV: 87 fL (ref 78.0–100.0)
PLATELETS: 255 10*3/uL (ref 150–400)
RBC: 4.47 MIL/uL (ref 3.87–5.11)
RDW: 13 % (ref 11.5–15.5)
WBC: 2.9 10*3/uL — ABNORMAL LOW (ref 4.0–10.5)

## 2016-07-26 LAB — URINALYSIS, ROUTINE W REFLEX MICROSCOPIC
BILIRUBIN URINE: NEGATIVE
Bacteria, UA: NONE SEEN
Glucose, UA: NEGATIVE mg/dL
KETONES UR: NEGATIVE mg/dL
LEUKOCYTES UA: NEGATIVE
NITRITE: NEGATIVE
PH: 7 (ref 5.0–8.0)
Protein, ur: NEGATIVE mg/dL
SPECIFIC GRAVITY, URINE: 1.015 (ref 1.005–1.030)

## 2016-07-26 LAB — COMPREHENSIVE METABOLIC PANEL
ALT: 13 U/L — AB (ref 14–54)
AST: 21 U/L (ref 15–41)
Albumin: 3.5 g/dL (ref 3.5–5.0)
Alkaline Phosphatase: 38 U/L (ref 38–126)
Anion gap: 7 (ref 5–15)
BUN: 10 mg/dL (ref 6–20)
CALCIUM: 9 mg/dL (ref 8.9–10.3)
CHLORIDE: 108 mmol/L (ref 101–111)
CO2: 23 mmol/L (ref 22–32)
Creatinine, Ser: 0.85 mg/dL (ref 0.44–1.00)
GLUCOSE: 101 mg/dL — AB (ref 65–99)
POTASSIUM: 4.2 mmol/L (ref 3.5–5.1)
SODIUM: 138 mmol/L (ref 135–145)
TOTAL PROTEIN: 6.7 g/dL (ref 6.5–8.1)
Total Bilirubin: 0.3 mg/dL (ref 0.3–1.2)

## 2016-07-26 LAB — I-STAT BETA HCG BLOOD, ED (MC, WL, AP ONLY)

## 2016-07-26 LAB — LIPASE, BLOOD: LIPASE: 24 U/L (ref 11–51)

## 2016-07-26 MED ORDER — KETOROLAC TROMETHAMINE 30 MG/ML IJ SOLN
30.0000 mg | Freq: Once | INTRAMUSCULAR | Status: AC
  Administered 2016-07-26: 30 mg via INTRAVENOUS
  Filled 2016-07-26: qty 1

## 2016-07-26 MED ORDER — ONDANSETRON HCL 4 MG/2ML IJ SOLN
4.0000 mg | Freq: Once | INTRAMUSCULAR | Status: AC
  Administered 2016-07-26: 4 mg via INTRAVENOUS
  Filled 2016-07-26: qty 2

## 2016-07-26 MED ORDER — SODIUM CHLORIDE 0.9 % IV BOLUS (SEPSIS)
1000.0000 mL | Freq: Once | INTRAVENOUS | Status: AC
  Administered 2016-07-26: 1000 mL via INTRAVENOUS

## 2016-07-26 NOTE — ED Provider Notes (Signed)
[facility] DEPT Provider Note   CSN: [id number] Arrival date & time: 07/26/16  1136   By signing my name below, I, Kristen Chung, attest that this documentation has been prepared under the direction and in the presence of [name], MD . Electronically Signed: Neta Chung, ED Scribe. 07/26/2016. 12:58 PM.   History   Chief Complaint Chief Complaint  Patient presents with  . Medication Reaction    The history is provided by the patient. No language interpreter was used.   HPI Comments:  Kristen Chung is a 31 y.o. female with PMHx of chlamydia, gonorrhea, UTI, kidney stones and Crohns disease who presents to the Emergency Department w/ concern for a medication reaction to Plan B that began yesterday. Pt reports that she took Plan B approximately 5 days ago and reports that she began having vaginal bleeding since yesterday. Pt complains of associated diaphoresis, lower abdominal pain, diarrhea, nausea, room-spinning dizziness, and weakness upon standing. Pt states that she woke up at 0400 today drenched in sweat, and that her symptoms worsened this AM. Pt reports that she has never taken this medication before. Pt states that she has not had any flare-ups with her Crohns and has not taken any medication for it in 1 year. Pt is sexually active with 1 partner. No alleviating factors noted. Pt denies sick contact and denies recent travel. Pt denies vomiting, vaginal discharge, blood in urine/stool.    Past Medical History:  Diagnosis Date  . Anxiety   . Chlamydia   . Crohn disease ([place])   . Depression    No specific treatment  . Genital warts   . Gonorrhea   . Heart murmur   . Kidney stone   . PID (acute pelvic inflammatory disease)   . Pregnancy induced hypertension   . Preterm labor   . Urinary tract infection     Patient Active Problem List   Diagnosis Date Noted  . Breast pain in female 05/13/2013  . Crohn disease ([place]) 05/13/2013  . Cocaine  substance abuse 05/13/2013  . Tobacco abuse counseling 05/13/2013  . Implanon in place 05/13/2013  . Contraception management 05/13/2013  . Health maintenance examination 05/13/2013  . Difficulty swallowing 05/13/2013  . 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 abortion     x2    OB History    Gravida Para Term Preterm AB Living   5 2 1 1 2 2    SAB TAB Ectopic Multiple Live Births   0 2 0 0 2       Home Medications    Prior to Admission medications   Medication Sig Start Date End Date Taking? Authorizing Provider  diphenhydrAMINE (BENADRYL) 25 mg capsule Take 1 capsule (25 mg total) by mouth every 6 (six) hours as needed. 11/09/15   Gloriann Loan, PA-C  EPINEPHrine 0.3 mg/0.3 mL IJ SOAJ injection Inject 0.3 mLs (0.3 mg total) into the muscle once. 11/09/15   Gloriann Loan, PA-C  famotidine (PEPCID) 20 MG tablet Take 1 tablet (20 mg total) by mouth 2 (two) times daily. 07/02/16   Dorie Rank, MD  ibuprofen (ADVIL,MOTRIN) 200 MG tablet Take 800 mg by mouth every 6 (six) hours as needed for moderate pain.    Historical Provider, MD  naproxen (NAPROSYN) 500 MG tablet Take 1 tablet (500 mg total) by mouth 2 (two) times daily. 07/02/16   Dorie Rank, MD  predniSONE (DELTASONE) 50  MG tablet Take 1 tablet (50 mg total) by mouth daily. 11/09/15   Gloriann Loan, PA-C  ranitidine (ZANTAC) 150 MG capsule Take 1 capsule (150 mg total) by mouth daily. 11/09/15   Gloriann Loan, PA-C    Family History Family History  Problem Relation Age of Onset  . Asthma Mother   . Heart disease Mother   . Diabetes Mother   . Hypertension Mother   . Cancer Father     Breast  . Asthma Sister   . Depression Sister   . Diabetes Sister   . Hypertension Sister   . Diabetes Other   . Anesthesia problems Neg Hx     Social History Social History  Substance Use Topics  . Smoking status: Current Every Day Smoker    Packs/day: 0.25    Years: 6.00     Types: Cigarettes  . Smokeless tobacco: Never Used  . Alcohol use Yes     Comment: 2-3 drinks/wk     Allergies   Celery oil; Codone [hydrocodone]; Dilaudid [hydromorphone hcl]; and Strawberry extract   Review of Systems Review of Systems 10 Systems reviewed and are negative for acute change except as noted in the HPI.  Physical Exam Updated Vital Signs BP (!) 162/115 (BP Location: Right Arm)   Pulse 79   Temp 97.8 F (36.6 C) (Oral)   Resp 16   LMP 06/05/2016   SpO2 100%   Physical Exam  Constitutional: She is oriented to person, place, and time. She appears well-developed and well-nourished. No distress.  Uncomfortable, tearful.   HENT:  Head: Normocephalic and atraumatic.  Right Ear: Tympanic membrane and ear canal normal.  Left Ear: Tympanic membrane and ear canal normal.  Moist mucous membranes  Eyes: Conjunctivae are normal. Pupils are equal, round, and reactive to light.  Neck: Neck supple.  Cardiovascular: Normal rate, regular rhythm and normal heart sounds.   No murmur heard. Pulmonary/Chest: Effort normal and breath sounds normal.  Abdominal: Soft. Bowel sounds are normal. She exhibits no distension. There is tenderness (Suprapubic). There is no rebound and no guarding.  Genitourinary:  Genitourinary Comments: Moderate amount of blood in vaginal vault, no cervical motion or adnexal tenderness  Musculoskeletal: She exhibits no edema.  Neurological: She is alert and oriented to person, place, and time.  Fluent speech  Skin: Skin is warm and dry.  Psychiatric: She has a normal mood and affect. Judgment normal.  Nursing note and vitals reviewed. Chaperone was present during exam.    ED Treatments / Results  DIAGNOSTIC STUDIES:  Oxygen Saturation is 100% on RA, normal by my interpretation.    COORDINATION OF CARE:  12:58 PM Discussed treatment plan with pt at bedside and pt agreed to plan.   Labs (all labs ordered are listed, but only abnormal results  are displayed) Labs Reviewed  COMPREHENSIVE METABOLIC PANEL - Abnormal; Notable for the following:       Result Value   Glucose, Bld 101 (*)    ALT 13 (*)    All other components within normal limits  CBC - Abnormal; Notable for the following:    WBC 2.9 (*)    All other components within normal limits  URINALYSIS, ROUTINE W REFLEX MICROSCOPIC - Abnormal; Notable for the following:    Hgb urine dipstick MODERATE (*)    Squamous Epithelial / LPF 0-5 (*)    All other components within normal limits  LIPASE, BLOOD  I-STAT BETA HCG BLOOD, ED ([facility], [facility], [facility] ONLY)  GC/CHLAMYDIA  PROBE AMP ([place]) NOT AT [facility]    EKG  EKG Interpretation None       Radiology No results found.  Procedures Procedures (including critical care time)  Medications Ordered in ED Medications  sodium chloride 0.9 % bolus 1,000 mL (0 mLs Intravenous Stopped 07/26/16 1547)  ondansetron (ZOFRAN) injection 4 mg (4 mg Intravenous Given 07/26/16 1322)  ketorolac (TORADOL) 30 MG/ML injection 30 mg (30 mg Intravenous Given 07/26/16 1322)     Initial Impression / Assessment and Plan / ED Course  I have reviewed the triage vital signs and the nursing notes.  Pertinent labs that were available during my care of the patient were reviewed by me and considered in my medical decision making (see chart for details).  Clinical Course    Patient presents with multiple symptoms including nausea, dizziness, lower abdominal pain, and vaginal bleeding. She was concerned because she took plan B for the first time 5 days ago. She was uncomfortable but nontoxic on exam. Vital signs notable for hypertension. She was afebrile. Mild suprapubic abdominal tenderness with no focal right lower quadrant pain. Pelvic exam notable only for vaginal bleeding, no findings concerning for PID. Gave the patient an IV fluid bolus, Zofran, and toradol.  Labwork shows normal CMP, negative pregnancy test, WBC 2.9 but otherwise unremarkable. On  reexamination, she stated that she felt much better and was able to drink orange juice with no difficulty. Her symptoms may suggest a viral syndrome initially given WBC count. I do not feel like her presentation is consistent with appendicitis and her pelvic exam was reassuring against acute gynecologic problem. I discussed supportive care and extensively reviewed return precautions including worsening abdominal pain, intractable vomiting, or new symptoms. Patient voiced understanding and was discharged in satisfactory condition.  Final Clinical Impressions(s) / ED Diagnoses   Final diagnoses:  None    New Prescriptions New Prescriptions   No medications on file  I personally performed the services described in this documentation, which was scribed in my presence. The recorded information has been reviewed and is accurate.    [name], MD 07/26/16 1556

## 2016-07-26 NOTE — ED Triage Notes (Signed)
Pt reports she took plan B approximately 5 days ago. She reports she is now having side affects of vaginal bleeding. She states she feels light headed and weak upon standing. Pt states she has never taken this medication before. Pt tearful in triage.

## 2016-07-26 NOTE — ED Notes (Signed)
Walked patient to the bathroom  Pt did well

## 2016-07-26 NOTE — ED Notes (Signed)
Patient is ready for discharge

## 2016-07-27 LAB — GC/CHLAMYDIA PROBE AMP (~~LOC~~) NOT AT ARMC
Chlamydia: NEGATIVE
Neisseria Gonorrhea: NEGATIVE

## 2016-11-11 ENCOUNTER — Inpatient Hospital Stay (HOSPITAL_COMMUNITY)
Admission: AD | Admit: 2016-11-11 | Discharge: 2016-11-11 | Disposition: A | Discharge status: Home / Self Care | Payer: Self-pay | Source: Ambulatory Visit | Attending: Obstetrics and Gynecology | Admitting: Obstetrics and Gynecology

## 2016-11-11 ENCOUNTER — Encounter (HOSPITAL_COMMUNITY): Payer: Self-pay

## 2016-11-11 ENCOUNTER — Inpatient Hospital Stay (HOSPITAL_COMMUNITY): Payer: Self-pay

## 2016-11-11 DIAGNOSIS — Z91018 Allergy to other foods: Secondary | ICD-10-CM | POA: Insufficient documentation

## 2016-11-11 DIAGNOSIS — R109 Unspecified abdominal pain: Secondary | ICD-10-CM

## 2016-11-11 DIAGNOSIS — B9689 Other specified bacterial agents as the cause of diseases classified elsewhere: Secondary | ICD-10-CM | POA: Insufficient documentation

## 2016-11-11 DIAGNOSIS — F329 Major depressive disorder, single episode, unspecified: Secondary | ICD-10-CM | POA: Insufficient documentation

## 2016-11-11 DIAGNOSIS — O99332 Smoking (tobacco) complicating pregnancy, second trimester: Secondary | ICD-10-CM | POA: Insufficient documentation

## 2016-11-11 DIAGNOSIS — Z3201 Encounter for pregnancy test, result positive: Secondary | ICD-10-CM | POA: Insufficient documentation

## 2016-11-11 DIAGNOSIS — N760 Acute vaginitis: Secondary | ICD-10-CM | POA: Insufficient documentation

## 2016-11-11 DIAGNOSIS — Z3A01 Less than 8 weeks gestation of pregnancy: Secondary | ICD-10-CM | POA: Insufficient documentation

## 2016-11-11 DIAGNOSIS — Z87442 Personal history of urinary calculi: Secondary | ICD-10-CM | POA: Insufficient documentation

## 2016-11-11 DIAGNOSIS — Z3491 Encounter for supervision of normal pregnancy, unspecified, first trimester: Secondary | ICD-10-CM

## 2016-11-11 DIAGNOSIS — F17210 Nicotine dependence, cigarettes, uncomplicated: Secondary | ICD-10-CM | POA: Insufficient documentation

## 2016-11-11 DIAGNOSIS — O26899 Other specified pregnancy related conditions, unspecified trimester: Secondary | ICD-10-CM

## 2016-11-11 DIAGNOSIS — O99342 Other mental disorders complicating pregnancy, second trimester: Secondary | ICD-10-CM | POA: Insufficient documentation

## 2016-11-11 DIAGNOSIS — K5090 Crohn's disease, unspecified, without complications: Secondary | ICD-10-CM | POA: Insufficient documentation

## 2016-11-11 DIAGNOSIS — O23591 Infection of other part of genital tract in pregnancy, first trimester: Secondary | ICD-10-CM | POA: Insufficient documentation

## 2016-11-11 DIAGNOSIS — O99321 Drug use complicating pregnancy, first trimester: Secondary | ICD-10-CM

## 2016-11-11 DIAGNOSIS — Z87440 Personal history of urinary (tract) infections: Secondary | ICD-10-CM | POA: Insufficient documentation

## 2016-11-11 DIAGNOSIS — Z8759 Personal history of other complications of pregnancy, childbirth and the puerperium: Secondary | ICD-10-CM | POA: Insufficient documentation

## 2016-11-11 DIAGNOSIS — Z8619 Personal history of other infectious and parasitic diseases: Secondary | ICD-10-CM | POA: Insufficient documentation

## 2016-11-11 DIAGNOSIS — Z885 Allergy status to narcotic agent status: Secondary | ICD-10-CM | POA: Insufficient documentation

## 2016-11-11 DIAGNOSIS — O26891 Other specified pregnancy related conditions, first trimester: Secondary | ICD-10-CM | POA: Insufficient documentation

## 2016-11-11 DIAGNOSIS — O162 Unspecified maternal hypertension, second trimester: Secondary | ICD-10-CM | POA: Insufficient documentation

## 2016-11-11 LAB — RAPID URINE DRUG SCREEN, HOSP PERFORMED
AMPHETAMINES: NOT DETECTED
Barbiturates: NOT DETECTED
Benzodiazepines: NOT DETECTED
Cocaine: POSITIVE — AB
Opiates: NOT DETECTED
TETRAHYDROCANNABINOL: POSITIVE — AB

## 2016-11-11 LAB — WET PREP, GENITAL
Sperm: NONE SEEN
TRICH WET PREP: NONE SEEN
YEAST WET PREP: NONE SEEN

## 2016-11-11 LAB — URINALYSIS, ROUTINE W REFLEX MICROSCOPIC
Bilirubin Urine: NEGATIVE
Glucose, UA: NEGATIVE mg/dL
Hgb urine dipstick: NEGATIVE
KETONES UR: 20 mg/dL — AB
LEUKOCYTES UA: NEGATIVE
NITRITE: NEGATIVE
PH: 6 (ref 5.0–8.0)
Protein, ur: NEGATIVE mg/dL
Specific Gravity, Urine: 1.027 (ref 1.005–1.030)

## 2016-11-11 LAB — CBC
HCT: 38.6 % (ref 36.0–46.0)
HEMOGLOBIN: 12.7 g/dL (ref 12.0–15.0)
MCH: 28.5 pg (ref 26.0–34.0)
MCHC: 32.9 g/dL (ref 30.0–36.0)
MCV: 86.7 fL (ref 78.0–100.0)
Platelets: 237 10*3/uL (ref 150–400)
RBC: 4.45 MIL/uL (ref 3.87–5.11)
RDW: 13.3 % (ref 11.5–15.5)
WBC: 5.7 10*3/uL (ref 4.0–10.5)

## 2016-11-11 LAB — POCT PREGNANCY, URINE: Preg Test, Ur: POSITIVE — AB

## 2016-11-11 LAB — HCG, QUANTITATIVE, PREGNANCY: HCG, BETA CHAIN, QUANT, S: 28078 m[IU]/mL — AB (ref <5)

## 2016-11-11 LAB — HIV ANTIBODY (ROUTINE TESTING W REFLEX): HIV Screen 4th Generation wRfx: NONREACTIVE

## 2016-11-11 MED ORDER — METRONIDAZOLE 500 MG PO TABS: 500.0000 mg | ORAL_TABLET | Freq: Two times a day (BID) | ORAL | 0 refills | Status: DC

## 2016-11-11 NOTE — Discharge Instructions (Signed)
[place] for [employer] at [facility]       Phone: [phone]  [place] for [employer] at [place] Phone: [place] for [employer] at [place]  Phone: [place] for [place] at [employer]  Phone: [place] for [place] at [place]  Phone: [facility]       Phone: [phone]  [facility]    Phone: [phone]   [facility] [place])    Phone: [facility]    Phone: [phone]  [facility]    Phone: [phone]  [place]    Phone: [phone]  [place] [facility]       Phone: [phone]   [place] [facility]  Phone: [place]    Phone: [phone]  [facility]   Phone: [phone]  [facility]      Phone: [phone]  [facility]    Phone: [phone]

## 2016-11-11 NOTE — MAU Provider Note (Signed)
Chief Complaint: Back Pain   None     SUBJECTIVE HPI: Kristen Chung is a 31 y.o. [id number] at [id number] LMP who presents to maternity admissions reporting left lower back pain starting today that radiates around to her left lower abdomen. The pain is constant but worse with movement and certain positions.  She reports some history of transient HTN but sometimes her blood pressure is normal. She does not take any blood pressure medications.  There is no n/v or other associated symptoms.  She reports recent use of cocaine 2 days ago and is unsure if she plans to keep this pregnancy. She initially denied vaginal bleeding but reports some light spotting while in MAU. She denies vaginal itching/burning, urinary symptoms, h/a, dizziness, n/v, or fever/chills.     HPI  Past Medical History:  Diagnosis Date  . Anxiety   . Chlamydia   . Crohn disease ([place]   . Depression    No specific treatment  . Genital warts   . Gonorrhea   . Heart murmur   . Kidney stone   . PID (acute pelvic inflammatory disease)   . Pregnancy induced hypertension   . Preterm labor   . Urinary tract infection    Past Surgical History:  Procedure Laterality Date  . abortion     x2   Social History   Social History  . Marital status: Single    Spouse name: N/A  . Number of children: N/A  . Years of education: N/A   Occupational History  . Not on file.   Social History Main Topics  . Smoking status: Current Every Day Smoker    Packs/day: 0.25    Years: 6.00    Types: Cigarettes  . Smokeless tobacco: Never Used  . Alcohol use Yes     Comment: 2-3 drinks/wk before preg 11/09/2016  . Drug use: Yes    Types: Cocaine, Marijuana     Comment: last cocaine was 11/09/2016  . Sexual activity: Yes     Comment: last intercourse 2 days ago   Other Topics Concern  . Not on file   Social History Narrative   Lives with mother and pt's two children.   No current facility-administered medications on file prior to  encounter.    Current Outpatient Prescriptions on File Prior to Encounter  Medication Sig Dispense Refill  . diphenhydrAMINE (BENADRYL) 25 mg capsule Take 1 capsule (25 mg total) by mouth every 6 (six) hours as needed. 30 capsule 0  . ibuprofen (ADVIL,MOTRIN) 200 MG tablet Take 800 mg by mouth every 6 (six) hours as needed for moderate pain.    .Marland KitchenEPINEPHrine 0.3 mg/0.3 mL IJ SOAJ injection Inject 0.3 mLs (0.3 mg total) into the muscle once. 1 Device 0  . famotidine (PEPCID) 20 MG tablet Take 1 tablet (20 mg total) by mouth 2 (two) times daily. 14 tablet 0  . naproxen (NAPROSYN) 500 MG tablet Take 1 tablet (500 mg total) by mouth 2 (two) times daily. 30 tablet 0  . predniSONE (DELTASONE) 50 MG tablet Take 1 tablet (50 mg total) by mouth daily. 4 tablet 0  . ranitidine (ZANTAC) 150 MG capsule Take 1 capsule (150 mg total) by mouth daily. 30 capsule 0   Allergies  Allergen Reactions  . Celery Oil Itching  . Codone [Hydrocodone] Itching  . Dilaudid [Hydromorphone Hcl] Itching  . Strawberry Extract Itching    ROS:  Review of Systems  Constitutional: Negative for chills, fatigue and fever.  Respiratory: Negative for shortness of breath.   Cardiovascular: Negative for chest pain.  Gastrointestinal: Negative for nausea and vomiting.  Genitourinary: Positive for pelvic pain and vaginal bleeding. Negative for difficulty urinating, dysuria, flank pain, vaginal discharge and vaginal pain.  Neurological: Negative for dizziness and headaches.  Psychiatric/Behavioral: Negative.      I have reviewed patient's Past Medical Hx, Surgical Hx, Family Hx, Social Hx, medications and allergies.   Physical Exam  Patient Vitals for the past 24 hrs:  BP Temp Temp src Pulse Resp SpO2 Height Weight  11/11/16 0609 133/87 - - 80 16 - - -  11/11/16 0357 (!) 150/99 - - 80 16 - - -  11/11/16 0328 (!) 137/102 98.9 F (37.2 C) Oral 88 18 99 % 5' 3"  (1.6 m) 144 lb 12 oz (65.7 kg)   Constitutional:  Well-developed, well-nourished female in no acute distress.  Cardiovascular: normal rate Respiratory: normal effort GI: Abd soft, non-tender. Pos BS x 4 MS: Extremities nontender, no edema, normal ROM Neurologic: Alert and oriented x 4.  GU: Neg CVAT.  PELVIC EXAM: wet prep/GCC collected by blind swab by RN   LAB RESULTS Results for orders placed or performed during the hospital encounter of 11/11/16 (from the past 24 hour(s))  Urinalysis, Routine w reflex microscopic     Status: Abnormal   Collection Time: 11/11/16  3:29 AM  Result Value Ref Range   Color, Urine YELLOW YELLOW   APPearance HAZY (A) CLEAR   Specific Gravity, Urine 1.027 1.005 - 1.030   pH 6.0 5.0 - 8.0   Glucose, UA NEGATIVE NEGATIVE mg/dL   Hgb urine dipstick NEGATIVE NEGATIVE   Bilirubin Urine NEGATIVE NEGATIVE   Ketones, ur 20 (A) NEGATIVE mg/dL   Protein, ur NEGATIVE NEGATIVE mg/dL   Nitrite NEGATIVE NEGATIVE   Leukocytes, UA NEGATIVE NEGATIVE  Urine rapid drug screen (hosp performed)     Status: Abnormal   Collection Time: 11/11/16  3:34 AM  Result Value Ref Range   Opiates NONE DETECTED NONE DETECTED   Cocaine POSITIVE (A) NONE DETECTED   Benzodiazepines NONE DETECTED NONE DETECTED   Amphetamines NONE DETECTED NONE DETECTED   Tetrahydrocannabinol POSITIVE (A) NONE DETECTED   Barbiturates NONE DETECTED NONE DETECTED  CBC     Status: None   Collection Time: 11/11/16  4:02 AM  Result Value Ref Range   WBC 5.7 4.0 - 10.5 K/uL   RBC 4.45 3.87 - 5.11 MIL/uL   Hemoglobin 12.7 12.0 - 15.0 g/dL   HCT 38.6 36.0 - 46.0 %   MCV 86.7 78.0 - 100.0 fL   MCH 28.5 26.0 - 34.0 pg   MCHC 32.9 30.0 - 36.0 g/dL   RDW 13.3 11.5 - 15.5 %   Platelets 237 150 - 400 K/uL  hCG, quantitative, pregnancy     Status: Abnormal   Collection Time: 11/11/16  4:02 AM  Result Value Ref Range   hCG, Beta Chain, Quant, S 28,078 (H) <5 mIU/mL  Pregnancy, urine POC     Status: Abnormal   Collection Time: 11/11/16  4:03 AM  Result  Value Ref Range   Preg Test, Ur POSITIVE (A) NEGATIVE  Wet prep, genital     Status: Abnormal   Collection Time: 11/11/16  6:06 AM  Result Value Ref Range   Yeast Wet Prep HPF POC NONE SEEN NONE SEEN   Trich, Wet Prep NONE SEEN NONE SEEN   Clue Cells Wet Prep HPF POC PRESENT (A) NONE SEEN   WBC,  Wet Prep HPF POC FEW (A) NONE SEEN   Sperm NONE SEEN        IMAGING US Ob Comp Less 14 Wks  Result Date: 11/11/2016 CLINICAL DATA:  Initial evaluation for acute left-sided back pain. Currently pregnant. Beta HCG pending. EXAM: OBSTETRIC <14 WK [place] AND TRANSVAGINAL OB US TECHNIQUE: Both transabdominal and transvaginal ultrasound examinations were performed for complete evaluation of the gestation as well as the maternal uterus, adnexal regions, and pelvic cul-de-sac. Transvaginal technique was performed to assess early pregnancy. COMPARISON:  None. FINDINGS: Intrauterine gestational sac: Single Yolk sac:  Present Embryo:  Present Cardiac Activity: Present Heart Rate: 119  bpm CRL:  4.2  mm   6 w   1 d                  [place] EDC: 07/06/2017 Subchorionic hemorrhage:  None visualized. Maternal uterus/adnexae: Right ovary well-visualized and is normal in appearance, measuring 3.0 x 1.7 x 2.1 cm. No right adnexal mass. Left ovary well-visualized and is normal in appearance measuring 2.9 x 2.1 x 2.7 cm. No left adnexal mass. No free fluid. IMPRESSION: 1. Single viable intrauterine pregnancy as above without complication. 2. No other acute abnormality within the pelvis. Normal sonographic appearance of the ovaries. No adnexal mass. Electronically Signed   By: Jeannine Boga M.D.   On: 11/11/2016 05:17   US Ob Transvaginal  Result Date: 11/11/2016 CLINICAL DATA:  Initial evaluation for acute left-sided back pain. Currently pregnant. Beta HCG pending. EXAM: OBSTETRIC <14 WK [place] AND TRANSVAGINAL OB US TECHNIQUE: Both transabdominal and transvaginal ultrasound examinations were performed for complete evaluation of  the gestation as well as the maternal uterus, adnexal regions, and pelvic cul-de-sac. Transvaginal technique was performed to assess early pregnancy. COMPARISON:  None. FINDINGS: Intrauterine gestational sac: Single Yolk sac:  Present Embryo:  Present Cardiac Activity: Present Heart Rate: 119  bpm CRL:  4.2  mm   6 w   1 d                  [place] EDC: 07/06/2017 Subchorionic hemorrhage:  None visualized. Maternal uterus/adnexae: Right ovary well-visualized and is normal in appearance, measuring 3.0 x 1.7 x 2.1 cm. No right adnexal mass. Left ovary well-visualized and is normal in appearance measuring 2.9 x 2.1 x 2.7 cm. No left adnexal mass. No free fluid. IMPRESSION: 1. Single viable intrauterine pregnancy as above without complication. 2. No other acute abnormality within the pelvis. Normal sonographic appearance of the ovaries. No adnexal mass. Electronically Signed   By: Jeannine Boga M.D.   On: 11/11/2016 05:17    MAU Management/MDM: Ordered CBC, quant hcg, U/A, wet prep, GC, and [place] and reviewed results.  IUP on today's [place], no evidence of acute abdomen or infection, no evidence of kidney stone or UTI.  Possibly musculoskeletal pain, or related to BV. Will treat for positive clue cells and cramping with Flagyl 500 mg BID x 7 days.  Precautions/reasons to return to MAU reviewed.  List of providers given.  Pt stable at time of discharge.  ASSESSMENT 1. Bacterial vaginosis   2. Abdominal pain during pregnancy, first trimester   3. Drug use affecting pregnancy in first trimester   4. Abdominal pain affecting pregnancy   5. Normal IUP (intrauterine pregnancy) on prenatal ultrasound, first trimester     PLAN Discharge home Allergies as of 11/11/2016      Reactions   Celery Oil Itching   Codone [hydrocodone] Itching   Dilaudid [  hydromorphone Hcl] Itching   Strawberry Extract Itching      Medication List    STOP taking these medications   famotidine 20 MG tablet Commonly known as:  PEPCID    ibuprofen 200 MG tablet Commonly known as:  ADVIL,MOTRIN   naproxen 500 MG tablet Commonly known as:  NAPROSYN   predniSONE 50 MG tablet Commonly known as:  DELTASONE   ranitidine 150 MG capsule Commonly known as:  ZANTAC     TAKE these medications   diphenhydrAMINE 25 mg capsule Commonly known as:  BENADRYL Take 1 capsule (25 mg total) by mouth every 6 (six) hours as needed.   EPINEPHrine 0.3 mg/0.3 mL Soaj injection Commonly known as:  EPI-PEN Inject 0.3 mLs (0.3 mg total) into the muscle once.   metroNIDAZOLE 500 MG tablet Commonly known as:  FLAGYL Take 1 tablet (500 mg total) by mouth 2 (two) times daily.      Follow-up [facility] for [facility] Follow up.   Specialty:  Obstetrics and Gynecology Why:  Or prenatal provider of your choice, return to MAU as needed for emergencies Contact information: [facility] Certified Nurse-Midwife 11/11/2016  6:48 AM

## 2016-11-11 NOTE — MAU Note (Signed)
Left side low back pain for 4 pm and took Advil.  Breasts tender. Took home UPT on Friday and was positive. No bleeding.

## 2016-11-12 LAB — GC/CHLAMYDIA PROBE AMP (~~LOC~~) NOT AT ARMC
CHLAMYDIA, DNA PROBE: NEGATIVE
NEISSERIA GONORRHEA: NEGATIVE

## 2017-04-19 ENCOUNTER — Emergency Department (HOSPITAL_BASED_OUTPATIENT_CLINIC_OR_DEPARTMENT_OTHER): Payer: Self-pay

## 2017-04-19 ENCOUNTER — Encounter (HOSPITAL_BASED_OUTPATIENT_CLINIC_OR_DEPARTMENT_OTHER): Payer: Self-pay | Admitting: Emergency Medicine

## 2017-04-19 ENCOUNTER — Emergency Department (HOSPITAL_BASED_OUTPATIENT_CLINIC_OR_DEPARTMENT_OTHER)
Admission: EM | Admit: 2017-04-19 | Discharge: 2017-04-19 | Disposition: A | Discharge status: Home / Self Care | Payer: Self-pay | Attending: Emergency Medicine | Admitting: Emergency Medicine

## 2017-04-19 DIAGNOSIS — M25552 Pain in left hip: Secondary | ICD-10-CM | POA: Insufficient documentation

## 2017-04-19 DIAGNOSIS — F17210 Nicotine dependence, cigarettes, uncomplicated: Secondary | ICD-10-CM | POA: Insufficient documentation

## 2017-04-19 MED ORDER — NAPROXEN 250 MG PO TABS
500.0000 mg | ORAL_TABLET | Freq: Once | ORAL | Status: AC
  Administered 2017-04-19: 500 mg via ORAL
  Filled 2017-04-19: qty 2

## 2017-04-19 MED ORDER — MELOXICAM 7.5 MG PO TABS: 15.0000 mg | ORAL_TABLET | Freq: Every day | ORAL | 0 refills | Status: DC

## 2017-04-19 MED FILL — MELOXICAM 7.5 MG TABLET: 7.5 | 15 days supply | Qty: 30 | Fill #0

## 2017-04-19 NOTE — Discharge Instructions (Signed)
Take the prescribed medication as directed. Follow-up with the [facility]-- call to make an appt. Return to the ED for new or worsening symptoms.

## 2017-04-19 NOTE — ED Notes (Signed)
Pt directed to pharmacy to pick up Rx

## 2017-04-19 NOTE — ED Triage Notes (Signed)
L hip pain x several months, no injury noted. Pt states pain has been worse over the past few days. Pt is a [employer] information [employer] manager.

## 2017-04-19 NOTE — ED Provider Notes (Signed)
[facility] Provider Note   CSN: [id number] Arrival date & time: 04/19/17  1225     History   Chief Complaint Chief Complaint  Patient presents with  . Hip Pain    HPI Kristen Chung is a 31 y.o. female.  The history is provided by the patient and medical records.  Hip Pain     31 year old female with history of anxiety, Crohn's disease, gonorrhea, PID, presenting to the ED with left hip pain.  Patient reports this is been ongoing for several months now. States she felt like initially her hip which is "stiff" and that she needed to stretch. 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  States she has not had any popping or clicking of the hip. States when walking last night her hip seemed to "give out" which it is never done before. States she continues to have a "pressure sensation" in her left hip, more so with weightbearing. States she feels okay when sitting still. No numbness or weakness of the left leg. She's not tried anything for her pain.  Past Medical History:  Diagnosis Date  . Anxiety   . Chlamydia   . Crohn disease ([place])   . Depression    No specific treatment  . Genital warts   . Gonorrhea   . Heart murmur   . Kidney stone   . PID (acute pelvic inflammatory disease)   . Pregnancy induced hypertension   . Preterm labor   . Urinary tract infection     Patient Active Problem List   Diagnosis Date Noted  . Breast pain in female 05/13/2013  . Crohn disease ([place]) 05/13/2013  . Cocaine substance abuse 05/13/2013  . Tobacco abuse counseling 05/13/2013  . Implanon in place 05/13/2013  . Contraception management 05/13/2013  . Health maintenance examination 05/13/2013  . Difficulty swallowing 05/13/2013  . 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 abortion     x2    OB History    Gravida Para Term Preterm AB Living   7 2 1 1 4 2    SAB  TAB Ectopic Multiple Live Births   1 3 0 0 2       Home Medications    Prior to Admission medications   Medication Sig Start Date End Date Taking? Authorizing Provider  diphenhydrAMINE (BENADRYL) 25 mg capsule Take 1 capsule (25 mg total) by mouth every 6 (six) hours as needed. 11/09/15   Gloriann Loan, PA-C  EPINEPHrine 0.3 mg/0.3 mL IJ SOAJ injection Inject 0.3 mLs (0.3 mg total) into the muscle once. 11/09/15   Gloriann Loan, PA-C  metroNIDAZOLE (FLAGYL) 500 MG tablet Take 1 tablet (500 mg total) by mouth 2 (two) times daily. 11/11/16   Leftwich-Kirby, Kathie Dike, CNM    Family History Family History  Problem Relation Age of Onset  . Asthma Mother   . Heart disease Mother   . Diabetes Mother   . Hypertension Mother   . Cancer Father        Breast  . Asthma Sister   . Depression Sister   . Diabetes Sister   . Hypertension Sister   . Diabetes Other   . Anesthesia problems Neg Hx     Social History Social History  Substance Use Topics  . Smoking status: Current Every Day Smoker    Packs/day: 0.25    Years: 6.00  Types: Cigarettes  . Smokeless tobacco: Never Used  . Alcohol use Yes     Comment: 2-3 drinks/wk before preg 11/09/2016     Allergies   Celery oil; Codone [hydrocodone]; Dilaudid [hydromorphone hcl]; and Strawberry extract   Review of Systems Review of Systems  Musculoskeletal: Positive for arthralgias.  All other systems reviewed and are negative.    Physical Exam Updated Vital Signs BP (!) 132/98 (BP Location: Left Arm)   Pulse 76   Temp 98.1 F (36.7 C) (Oral)   Resp 18   Ht 5' 3"  (1.6 m)   Wt 66.2 kg (146 lb)   LMP 03/30/2017   SpO2 99%   Breastfeeding? Unknown   BMI 25.86 kg/m   Physical Exam  Constitutional: She is oriented to person, place, and time. She appears well-developed and well-nourished.  Sitting indian style in exam chair, eating cereal and watching movies on phone  HENT:  Head: Normocephalic and atraumatic.  Mouth/Throat:  Oropharynx is clear and moist.  Eyes: Pupils are equal, round, and reactive to light. Conjunctivae and EOM are normal.  Neck: Normal range of motion.  Cardiovascular: Normal rate, regular rhythm and normal heart sounds.   Pulmonary/Chest: Effort normal and breath sounds normal. No respiratory distress. She has no wheezes.  Abdominal: Soft. Bowel sounds are normal. There is no tenderness. There is no rebound.  Musculoskeletal: Normal range of motion.  Left hip overall normal in appearance, no swelling or deformities, no focal tenderness, when standing reports "pressure" in the left medial hip and into the groin, no leg shortening  Neurological: She is alert and oriented to person, place, and time.  Skin: Skin is warm and dry.  Psychiatric: She has a normal mood and affect.  Nursing note and vitals reviewed.    ED Treatments / Results  Labs (all labs ordered are listed, but only abnormal results are displayed) Labs Reviewed - No data to display  EKG  EKG Interpretation None       Radiology Dg Hip Unilat With Pelvis 2-3 Views Left  Result Date: 04/19/2017 CLINICAL DATA:  31 year old female with left lateral hip pain for 3 months. EXAM: DG HIP (WITH OR WITHOUT PELVIS) 2-3V LEFT COMPARISON:  CT Abdomen and Pelvis 11/13/2015. FINDINGS: Piercings incidentally noted. Bone mineralization is within normal limits. Femoral heads are normally located. Hip joint spaces are stable and appear normal. The proximal right femur appears grossly intact. No pelvis fracture. Sacral ala and SI joints appear normal. Negative visible lower lumbar levels. Proximal left femur appears intact and normal. IMPRESSION: Negative radiographic appearance of the left hip and pelvis. Electronically Signed   By: Genevie Ann M.D.   On: 04/19/2017 13:05    Procedures Procedures (including critical care time)  Medications Ordered in ED Medications - No data to display   Initial Impression / Assessment and Plan / ED Course   I have reviewed the triage vital signs and the nursing notes.  Pertinent labs & imaging results that were available during my care of the patient were reviewed by me and considered in my medical decision making (see chart for details).  31 year old female who with left hip pain. Has been ongoing for a few months now. She is an Engineer, maintenance (IT). Mostly has pain in the hip when weight bearing and during ambulation.  No pain here when sitting but once standing, complains of pain into the medial left hip and into the groin. No deformities noted on exam. No leg shortening. No signs of trauma.  No reported injury. Screening x-ray obtained without acute findings. Suspect this may be ligamentous in nature.  Does not have PCP or insurance and cannot afford to pay for specialist visit out of pocket so will refer to wellness clinic.  Start trial of mobic.  Discussed plan with patient, she acknowledged understanding and agreed with plan of care.  Return precautions given for new or worsening symptoms.  Final Clinical Impressions(s) / ED Diagnoses   Final diagnoses:  Left hip pain    New Prescriptions New Prescriptions   MELOXICAM (MOBIC) 7.5 MG TABLET    Take 2 tablets (15 mg total) by mouth daily.     Larene Pickett, PA-C 04/19/17 1409    Tegeler, Gwenyth Allegra, MD 04/19/17 [phone]

## 2017-12-06 ENCOUNTER — Encounter (HOSPITAL_BASED_OUTPATIENT_CLINIC_OR_DEPARTMENT_OTHER): Payer: Self-pay | Admitting: Emergency Medicine

## 2017-12-06 ENCOUNTER — Emergency Department (HOSPITAL_BASED_OUTPATIENT_CLINIC_OR_DEPARTMENT_OTHER)
Admission: EM | Admit: 2017-12-06 | Discharge: 2017-12-06 | Disposition: A | Discharge status: Home / Self Care | Payer: Self-pay | Attending: Emergency Medicine | Admitting: Emergency Medicine

## 2017-12-06 ENCOUNTER — Emergency Department (HOSPITAL_BASED_OUTPATIENT_CLINIC_OR_DEPARTMENT_OTHER): Payer: Self-pay

## 2017-12-06 ENCOUNTER — Other Ambulatory Visit: Payer: Self-pay

## 2017-12-06 DIAGNOSIS — Z79899 Other long term (current) drug therapy: Secondary | ICD-10-CM | POA: Insufficient documentation

## 2017-12-06 DIAGNOSIS — R079 Chest pain, unspecified: Secondary | ICD-10-CM | POA: Insufficient documentation

## 2017-12-06 DIAGNOSIS — R51 Headache: Secondary | ICD-10-CM | POA: Insufficient documentation

## 2017-12-06 DIAGNOSIS — Z3202 Encounter for pregnancy test, result negative: Secondary | ICD-10-CM | POA: Insufficient documentation

## 2017-12-06 DIAGNOSIS — A64 Unspecified sexually transmitted disease: Secondary | ICD-10-CM | POA: Insufficient documentation

## 2017-12-06 DIAGNOSIS — F17210 Nicotine dependence, cigarettes, uncomplicated: Secondary | ICD-10-CM | POA: Insufficient documentation

## 2017-12-06 DIAGNOSIS — R42 Dizziness and giddiness: Secondary | ICD-10-CM | POA: Insufficient documentation

## 2017-12-06 LAB — CBC
HCT: 37.1 % (ref 36.0–46.0)
Hemoglobin: 12.2 g/dL (ref 12.0–15.0)
MCH: 29 pg (ref 26.0–34.0)
MCHC: 32.9 g/dL (ref 30.0–36.0)
MCV: 88.3 fL (ref 78.0–100.0)
PLATELETS: 238 10*3/uL (ref 150–400)
RBC: 4.2 MIL/uL (ref 3.87–5.11)
RDW: 12.9 % (ref 11.5–15.5)
WBC: 5.1 10*3/uL (ref 4.0–10.5)

## 2017-12-06 LAB — BASIC METABOLIC PANEL
Anion gap: 8 (ref 5–15)
BUN: 10 mg/dL (ref 6–20)
CALCIUM: 9 mg/dL (ref 8.9–10.3)
CHLORIDE: 103 mmol/L (ref 101–111)
CO2: 24 mmol/L (ref 22–32)
CREATININE: 0.92 mg/dL (ref 0.44–1.00)
GFR calc Af Amer: >60 mL/min (ref >60)
GFR calc non Af Amer: >60 mL/min (ref >60)
GLUCOSE: 88 mg/dL (ref 65–99)
Potassium: 3.2 mmol/L — ABNORMAL LOW (ref 3.5–5.1)
Sodium: 135 mmol/L (ref 135–145)

## 2017-12-06 LAB — URINALYSIS, ROUTINE W REFLEX MICROSCOPIC
Bilirubin Urine: NEGATIVE
Glucose, UA: NEGATIVE mg/dL
HGB URINE DIPSTICK: NEGATIVE
Ketones, ur: NEGATIVE mg/dL
Nitrite: NEGATIVE
PROTEIN: NEGATIVE mg/dL
pH: 6.5 (ref 5.0–8.0)

## 2017-12-06 LAB — URINALYSIS, MICROSCOPIC (REFLEX)

## 2017-12-06 LAB — TROPONIN I

## 2017-12-06 LAB — WET PREP, GENITAL
Sperm: NONE SEEN
YEAST WET PREP: NONE SEEN

## 2017-12-06 LAB — PREGNANCY, URINE: PREG TEST UR: NEGATIVE

## 2017-12-06 MED ORDER — CEFTRIAXONE SODIUM 250 MG IJ SOLR
250.0000 mg | Freq: Once | INTRAMUSCULAR | Status: AC
  Administered 2017-12-06: 250 mg via INTRAMUSCULAR
  Filled 2017-12-06: qty 250

## 2017-12-06 MED ORDER — AZITHROMYCIN 250 MG PO TABS
1000.0000 mg | ORAL_TABLET | Freq: Once | ORAL | Status: AC
  Administered 2017-12-06: 1000 mg via ORAL
  Filled 2017-12-06: qty 4

## 2017-12-06 MED ORDER — METRONIDAZOLE 500 MG PO TABS
2000.0000 mg | ORAL_TABLET | Freq: Once | ORAL | Status: AC
  Administered 2017-12-06: 2000 mg via ORAL
  Filled 2017-12-06: qty 4

## 2017-12-06 MED ORDER — MECLIZINE HCL 25 MG PO TABS
12.5000 mg | ORAL_TABLET | Freq: Once | ORAL | Status: AC
  Administered 2017-12-06: 12.5 mg via ORAL
  Filled 2017-12-06: qty 1

## 2017-12-06 MED ORDER — AMLODIPINE BESYLATE 5 MG PO TABS: 5.0000 mg | ORAL_TABLET | Freq: Every day | ORAL | 0 refills | Status: DC

## 2017-12-06 MED ORDER — LIDOCAINE HCL (PF) 1 % IJ SOLN
INTRAMUSCULAR | Status: AC
  Administered 2017-12-06: 5 mL
  Filled 2017-12-06: qty 5

## 2017-12-06 MED ORDER — CLONIDINE HCL 0.1 MG PO TABS
0.1000 mg | ORAL_TABLET | Freq: Once | ORAL | Status: AC
  Administered 2017-12-06: 0.1 mg via ORAL
  Filled 2017-12-06: qty 1

## 2017-12-06 MED ORDER — MECLIZINE HCL 12.5 MG PO TABS: 12.5000 mg | ORAL_TABLET | Freq: Three times a day (TID) | ORAL | 0 refills | Status: AC | PRN

## 2017-12-06 MED ORDER — ONDANSETRON HCL 8 MG PO TABS
4.0000 mg | ORAL_TABLET | Freq: Once | ORAL | Status: AC
  Administered 2017-12-06: 4 mg via ORAL
  Filled 2017-12-06: qty 1

## 2017-12-06 NOTE — Discharge Instructions (Addendum)
Follow-up with PCP for high blood pressure. Follow-up with ENT if vertigo persists or worsens.

## 2017-12-06 NOTE — ED Notes (Signed)
Have attempted 3 times to get patient for CXR; PA in with patient; on last attempt, PA stated that patient was feeling dizzy and asked that we wait on CXR; tech asked if ED could call us when patient was ready-- PA agreed

## 2017-12-06 NOTE — ED Notes (Signed)
Pt states over the past week pt has had chest pain, along with dizziness, states she has tunnel vision at times, when moving suddenly or changing positions states dizziness is worse, also has been having headaches. States HA is more at left side of head, states seeing spots intermittently/ MD immediately notified of pts current NBP readings

## 2017-12-06 NOTE — ED Triage Notes (Addendum)
Pt reports dizziness and nausea since yesterday. Pt states she may be pregnant.

## 2017-12-06 NOTE — ED Provider Notes (Signed)
[facility] DEPART[facility] Provider Note  CSN: [id number] Arrival date & time: 12/06/17  1738  History   Chief Complaint Chief Complaint  Patient presents with  . Dizziness    HPI Kristen Chung is a 32 y.o. female with a medical history of HTN, Crohn's and STIs who presented to the ED for dizziness. Patient states that she first began to experience dizziness in 2017 and describes it as room spinning. She states that the episodes occurred 1-2x per week, but would resolve within a few minutes. However, she states that over the last 1-2 months the episodes have gradually become worse and states that she has vertiginous episodes most days and it takes 1-2 days for them to resolve completely. During these episodes, she describes room spinning, lightheadedness, nausea with occassional chest pain. She describes floaters before the episode begins. Cannot identify any specific aggravating or alleviating factors or what causes them to resolve on their own.  Patient also complains of intermittent headaches which do not correlate with the dizzy episodes and are relived with ibuprofen. Patient has history of HTN which has been untreated for almost a year. Denies SOB, leg swelling, palpitations, changes in vision, eye pain or GU complaints.   Past Medical History:  Diagnosis Date  . Anxiety   . Chlamydia   . Crohn disease ([place])   . Depression    No specific treatment  . Genital warts   . Gonorrhea   . Heart murmur   . Kidney stone   . PID (acute pelvic inflammatory disease)   . Pregnancy induced hypertension   . Preterm labor   . Urinary tract infection     Patient Active Problem List   Diagnosis Date Noted  . Breast pain in female 05/13/2013  . Crohn disease ([place]) 05/13/2013  . Cocaine substance abuse ([place]) 05/13/2013  . Tobacco abuse counseling 05/13/2013  . Implanon in place 05/13/2013  . Contraception management 05/13/2013  . Health maintenance examination 05/13/2013    . Difficulty swallowing 05/13/2013  . 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 abortion     x2     OB History    Gravida  7   Para  2   Term  1   Preterm  1   AB  4   Living  2     SAB  1   TAB  3   Ectopic  0   Multiple  0   Live Births  2           Home Medications    Prior to Admission medications   Medication Sig Start Date End Date Taking? Authorizing Provider  diphenhydrAMINE (BENADRYL) 25 mg capsule Take 1 capsule (25 mg total) by mouth every 6 (six) hours as needed. 11/09/15   Gloriann Loan, PA-C  EPINEPHrine 0.3 mg/0.3 mL IJ SOAJ injection Inject 0.3 mLs (0.3 mg total) into the muscle once. 11/09/15   Gloriann Loan, PA-C  meloxicam (MOBIC) 7.5 MG tablet Take 2 tablets (15 mg total) by mouth daily. 04/19/17   Larene Pickett, PA-C  metroNIDAZOLE (FLAGYL) 500 MG tablet Take 1 tablet (500 mg total) by mouth 2 (two) times daily. 11/11/16   Leftwich-Kirby, Kathie Dike, CNM    Family History Family History  Problem Relation Age of Onset  . Asthma Mother   . Heart disease Mother   . Diabetes Mother   . Hypertension Mother   .  Cancer Father        Breast  . Asthma Sister   . Depression Sister   . Diabetes Sister   . Hypertension Sister   . Diabetes Other   . Anesthesia problems Neg Hx     Social History Social History   Tobacco Use  . Smoking status: Current Every Day Smoker    Packs/day: 0.25    Years: 6.00    Pack years: 1.50    Types: Cigarettes  . Smokeless tobacco: Never Used  Substance Use Topics  . Alcohol use: Yes    Comment: 2-3 drinks/wk before preg 11/09/2016  . Drug use: Yes    Types: Cocaine, Marijuana     Allergies   Celery oil; Codone [hydrocodone]; Dilaudid [hydromorphone hcl]; and Strawberry extract   Review of Systems Review of Systems  Constitutional: Negative.   HENT: Negative for ear discharge, ear pain, hearing loss and tinnitus.   Eyes: Positive  for visual disturbance. Negative for photophobia and pain.       Scotomas prior to vertigo. No change in vision acuity.   Respiratory: Negative for choking, chest tightness and shortness of breath.   Cardiovascular: Positive for chest pain. Negative for palpitations and leg swelling.  Gastrointestinal: Negative.  Negative for abdominal pain, constipation and diarrhea.  Genitourinary: Negative for decreased urine volume, difficulty urinating, dyspareunia, dysuria, flank pain, menstrual problem, urgency, vaginal bleeding, vaginal discharge and vaginal pain.  Musculoskeletal: Negative.   Skin: Negative.   Neurological: Positive for dizziness, light-headedness and headaches. Negative for tremors, seizures, syncope, facial asymmetry, speech difficulty, weakness and numbness.     Physical Exam Updated Vital Signs BP (!) 143/106   Pulse 74   Temp 98.5 F (36.9 C) (Oral)   Resp 18   Ht 5' 4"  (1.626 m)   Wt 65.8 kg (145 lb)   LMP 11/12/2017   SpO2 100%   BMI 24.89 kg/m   Physical Exam  Constitutional: She is oriented to person, place, and time. Vital signs are normal. She appears well-developed and well-nourished. She is cooperative. She does not appear ill. No distress.  HENT:  Head: Normocephalic and atraumatic.  Right Ear: Hearing, tympanic membrane, external ear and ear canal normal.  Left Ear: Hearing and external ear normal. No drainage or tenderness. A middle ear effusion is present. No decreased hearing is noted.  Mouth/Throat: Uvula is midline, oropharynx is clear and moist and mucous membranes are normal.  Eyes: Pupils are equal, round, and reactive to light. Conjunctivae, EOM and lids are normal.  Fundoscopic exam:      The right eye shows no AV nicking, no exudate, no hemorrhage and no papilledema.       The left eye shows no AV nicking, no exudate, no hemorrhage and no papilledema.  Cardiovascular: Normal rate, regular rhythm, S1 normal, S2 normal, normal heart sounds,  intact distal pulses and normal pulses. PMI is not displaced. Exam reveals no S3 and no S4.  No murmur heard. Pulmonary/Chest: Effort normal and breath sounds normal.  Abdominal: Soft. Normal appearance and bowel sounds are normal. There is no tenderness.  Neurological: She is alert and oriented to person, place, and time. She has normal strength. No cranial nerve deficit or sensory deficit. She displays a negative Romberg sign. Coordination and gait normal. GCS eye subscore is 4. GCS verbal subscore is 5. GCS motor subscore is 6.  Skin: Skin is warm, dry and intact.     ED Treatments / Results  Labs (all labs  ordered are listed, but only abnormal results are displayed) Labs Reviewed  URINALYSIS, ROUTINE W REFLEX MICROSCOPIC - Abnormal; Notable for the following components:      Result Value   Color, Urine COLORLESS (*)    APPearance HAZY (*)    Specific Gravity, Urine <1.005 (*)    Leukocytes, UA MODERATE (*)    All other components within normal limits  URINALYSIS, MICROSCOPIC (REFLEX) - Abnormal; Notable for the following components:   Bacteria, UA MANY (*)    Trichomonas, UA PRESENT (*)    All other components within normal limits  BASIC METABOLIC PANEL - Abnormal; Notable for the following components:   Potassium 3.2 (*)    All other components within normal limits  WET PREP, GENITAL  PREGNANCY, URINE  CBC  TROPONIN I  RPR  HIV ANTIBODY (ROUTINE TESTING)  GC/CHLAMYDIA PROBE AMP ([place]) NOT AT [facility]    EKG EKG Interpretation  Date/Time:  Friday Dec 06 2017 18:15:19 EDT Ventricular Rate:  77 PR Interval:    QRS Duration: 77 QT Interval:  385 QTC Calculation: 436 R Axis:   84 Text Interpretation:  Sinus rhythm LVH by voltage Confirmed by Quintella Reichert [phone]) on 12/06/2017 6:17:50 PM   Radiology No results found.  Procedures Procedures (including critical care time)  Medications Ordered in ED Medications  cloNIDine (CATAPRES) tablet 0.1 mg (0.1 mg Oral  Given 12/06/17 1900)  cefTRIAXone (ROCEPHIN) injection 250 mg (250 mg Intramuscular Given 12/06/17 1922)  azithromycin (ZITHROMAX) tablet 1,000 mg (1,000 mg Oral Given 12/06/17 1921)  metroNIDAZOLE (FLAGYL) tablet 2,000 mg (2,000 mg Oral Given 12/06/17 1922)  meclizine (ANTIVERT) tablet 12.5 mg (12.5 mg Oral Given 12/06/17 1927)  lidocaine (PF) (XYLOCAINE) 1 % injection (5 mLs  Given 12/06/17 1922)     Initial Impression / Assessment and Plan / ED Course  Triage vital signs and the nursing notes have been reviewed.  Pertinent labs & imaging results that were available during care of the patient were reviewed and considered in medical decision making (see chart for details).  Kristen Chung is a 32 y.o. female with a medical history of HTN, Crohn's and STIs who presented to the ED for dizziness. Patient has low risk factors for ACS, PE, CVA/TIA. However, given patient's elevated BP (166/122) upon arrival to the ED and intermittent symptoms of headaches and scotomas with vertigo episodes, head CT was performed and show no acute abnormalities, ischemia or hemorrhage. CXR was normal. EKG showed NSR with no signs of acute infarct or ischemia.  Middle ear effusion noted on physical exam is most likely contributing to dizziness and lightheadedness. Admits to past respiratory infections that have gone untreated which could contribute to development of effusion. No sign of acute ear infection that needs to be treated with antibiotic today. Will refer to ENT if vertigo persists or worsens.  Patient was re-evaluated during a vertigo episode in the ED. Nausea endorsed during episode. No focal neuro deficits on exam. Denied CP, SOB, palpitations. No seizure-like activity was observed. Endorsed worsening vertigo when supine and head turned to left. Patient remained conscious throughout episode and was able to converse and follow commands without issue. Meclizine given in ED. Vertigo subsided after approx. 30  minutes.  UA was + for trichomonas. Patient was offered pelvic exam and STI screening. Past history of chlamydia, gonorrhea, HSV and trichomonas. Wet prep showed trichomonas and clue cells. Patient treated with Flagyl for Trichomonas and BV. Also given Rocephin and Zithromax as prophylactic treatment of  gonorrhea and chlamydia  Final Clinical Impressions(s) / ED Diagnoses  1. Vertigo. Left middle ear effusion appreciated on physical exam. Meclizine given in the ED for acute episode. Information for ENT provided. Vertigo episode experienced in ED resolved. Patient is able to ambulate without issue. 2. Pending STI Labs (GC/chlamydia, RPR and HIV). Patient will be called if results return positive. Treated for gonorrhea and chlamydia prophylactically with Rocephin and Zithromax in the ED. Education provided on STI prevention given patient's high risk lifestyle and multiple sex partners. 3. Hypertension. 30 day Rx for Norvasc 56m QD which patient had taken in past without issue. Clonidine 0.129mx1 given in the ED as BP was 166/122 upon arrival. BP decreased to 117/73 before discharge.  Dispo: Home. After thorough clinical evaluation, this patient is determined to be medically stable and can be safely discharged with the previously mentioned treatment and/or outpatient follow-up/referral(s). At this time, there are no other apparent medical conditions that require further screening, evaluation or treatment.  Final diagnoses:  None    ED Discharge Orders    None        MoJunita Push5/17/19 2142    ReQuintella ReichertMD 12/07/17 1501

## 2017-12-06 NOTE — ED Notes (Signed)
Chart reviewed, noted NBP of 168/111, NBP reassessed with pt lying on stretcher RT arm: 166/122 (134) LT arm:  158/114 (128)

## 2017-12-08 LAB — HIV ANTIBODY (ROUTINE TESTING W REFLEX): HIV Screen 4th Generation wRfx: NONREACTIVE

## 2017-12-08 LAB — RPR: RPR Ser Ql: NONREACTIVE

## 2017-12-09 LAB — GC/CHLAMYDIA PROBE AMP (~~LOC~~) NOT AT ARMC
CHLAMYDIA, DNA PROBE: NEGATIVE
NEISSERIA GONORRHEA: NEGATIVE

## 2018-05-05 DIAGNOSIS — H903 Sensorineural hearing loss, bilateral: Secondary | ICD-10-CM | POA: Insufficient documentation

## 2018-05-05 DIAGNOSIS — H9311 Tinnitus, right ear: Secondary | ICD-10-CM | POA: Insufficient documentation

## 2018-05-20 ENCOUNTER — Emergency Department (HOSPITAL_BASED_OUTPATIENT_CLINIC_OR_DEPARTMENT_OTHER): Payer: Medicaid Other

## 2018-05-20 ENCOUNTER — Other Ambulatory Visit: Payer: Self-pay

## 2018-05-20 ENCOUNTER — Encounter (HOSPITAL_BASED_OUTPATIENT_CLINIC_OR_DEPARTMENT_OTHER): Payer: Self-pay

## 2018-05-20 ENCOUNTER — Emergency Department (HOSPITAL_BASED_OUTPATIENT_CLINIC_OR_DEPARTMENT_OTHER)
Admission: EM | Admit: 2018-05-20 | Discharge: 2018-05-20 | Disposition: A | Discharge status: Home / Self Care | Payer: Medicaid Other | Attending: Emergency Medicine | Admitting: Emergency Medicine

## 2018-05-20 DIAGNOSIS — O209 Hemorrhage in early pregnancy, unspecified: Secondary | ICD-10-CM

## 2018-05-20 DIAGNOSIS — F419 Anxiety disorder, unspecified: Secondary | ICD-10-CM | POA: Diagnosis not present

## 2018-05-20 DIAGNOSIS — F17210 Nicotine dependence, cigarettes, uncomplicated: Secondary | ICD-10-CM | POA: Diagnosis not present

## 2018-05-20 DIAGNOSIS — F1290 Cannabis use, unspecified, uncomplicated: Secondary | ICD-10-CM | POA: Diagnosis not present

## 2018-05-20 DIAGNOSIS — O99341 Other mental disorders complicating pregnancy, first trimester: Secondary | ICD-10-CM | POA: Diagnosis not present

## 2018-05-20 DIAGNOSIS — F1410 Cocaine abuse, uncomplicated: Secondary | ICD-10-CM | POA: Diagnosis not present

## 2018-05-20 DIAGNOSIS — O99331 Smoking (tobacco) complicating pregnancy, first trimester: Secondary | ICD-10-CM | POA: Diagnosis not present

## 2018-05-20 DIAGNOSIS — O200 Threatened abortion: Secondary | ICD-10-CM | POA: Diagnosis not present

## 2018-05-20 DIAGNOSIS — O99321 Drug use complicating pregnancy, first trimester: Secondary | ICD-10-CM | POA: Insufficient documentation

## 2018-05-20 DIAGNOSIS — Z3A01 Less than 8 weeks gestation of pregnancy: Secondary | ICD-10-CM | POA: Insufficient documentation

## 2018-05-20 DIAGNOSIS — Z79899 Other long term (current) drug therapy: Secondary | ICD-10-CM | POA: Diagnosis not present

## 2018-05-20 DIAGNOSIS — F329 Major depressive disorder, single episode, unspecified: Secondary | ICD-10-CM | POA: Diagnosis not present

## 2018-05-20 HISTORY — DX: Diaphragmatic hernia without obstruction or gangrene: K44.9

## 2018-05-20 HISTORY — DX: Esophagitis, unspecified without bleeding: K20.90

## 2018-05-20 HISTORY — DX: Benign paroxysmal vertigo, unspecified ear: H81.10

## 2018-05-20 HISTORY — DX: Esophagitis, unspecified: K20.9

## 2018-05-20 HISTORY — DX: Unspecified hearing loss, unspecified ear: H91.90

## 2018-05-20 HISTORY — DX: Gastritis, unspecified, without bleeding: K29.70

## 2018-05-20 LAB — CBC WITH DIFFERENTIAL/PLATELET
Abs Immature Granulocytes: 0.03 10*3/uL (ref 0.00–0.07)
BASOS PCT: 0 %
Basophils Absolute: 0 10*3/uL (ref 0.0–0.1)
EOS ABS: 0 10*3/uL (ref 0.0–0.5)
EOS PCT: 0 %
HCT: 34 % — ABNORMAL LOW (ref 36.0–46.0)
Hemoglobin: 10.8 g/dL — ABNORMAL LOW (ref 12.0–15.0)
Immature Granulocytes: 1 %
Lymphocytes Relative: 18 %
Lymphs Abs: 1.1 10*3/uL (ref 0.7–4.0)
MCH: 27.5 pg (ref 26.0–34.0)
MCHC: 31.8 g/dL (ref 30.0–36.0)
MCV: 86.5 fL (ref 80.0–100.0)
Monocytes Absolute: 0.1 10*3/uL (ref 0.1–1.0)
Monocytes Relative: 2 %
Neutro Abs: 5 10*3/uL (ref 1.7–7.7)
Neutrophils Relative %: 79 %
PLATELETS: 241 10*3/uL (ref 150–400)
RBC: 3.93 MIL/uL (ref 3.87–5.11)
RDW: 12.4 % (ref 11.5–15.5)
WBC: 6.2 10*3/uL (ref 4.0–10.5)
nRBC: 0 % (ref 0.0–0.2)

## 2018-05-20 LAB — URINALYSIS, ROUTINE W REFLEX MICROSCOPIC
BILIRUBIN URINE: NEGATIVE
Glucose, UA: NEGATIVE mg/dL
Ketones, ur: NEGATIVE mg/dL
Leukocytes, UA: NEGATIVE
Nitrite: NEGATIVE
PH: 7 (ref 5.0–8.0)
Protein, ur: NEGATIVE mg/dL
SPECIFIC GRAVITY, URINE: 1.01 (ref 1.005–1.030)

## 2018-05-20 LAB — URINALYSIS, MICROSCOPIC (REFLEX)

## 2018-05-20 LAB — ABO/RH: ABO/RH(D): A POS

## 2018-05-20 LAB — BASIC METABOLIC PANEL
Anion gap: 8 (ref 5–15)
BUN: 10 mg/dL (ref 6–20)
CALCIUM: 9.4 mg/dL (ref 8.9–10.3)
CO2: 20 mmol/L — ABNORMAL LOW (ref 22–32)
CREATININE: 0.73 mg/dL (ref 0.44–1.00)
Chloride: 105 mmol/L (ref 98–111)
GFR calc Af Amer: >60 mL/min (ref >60)
Glucose, Bld: 117 mg/dL — ABNORMAL HIGH (ref 70–99)
POTASSIUM: 3.9 mmol/L (ref 3.5–5.1)
SODIUM: 133 mmol/L — AB (ref 135–145)

## 2018-05-20 LAB — HCG, QUANTITATIVE, PREGNANCY: hCG, Beta Chain, Quant, S: 119217 m[IU]/mL — ABNORMAL HIGH (ref <5)

## 2018-05-20 MED ORDER — ACETAMINOPHEN 325 MG PO TABS
650.0000 mg | ORAL_TABLET | Freq: Once | ORAL | Status: AC
  Administered 2018-05-20: 650 mg via ORAL
  Filled 2018-05-20: qty 2

## 2018-05-20 NOTE — Discharge Instructions (Addendum)
Get help right away if: You have heavy vaginal bleeding. You have blood clots coming from your vagina. You have severe low back pain or abdominal cramps. You have fever, chills, and severe abdominal pain.

## 2018-05-20 NOTE — ED Provider Notes (Signed)
[facility] DEPART[facility] Provider Note   CSN: [id number] Arrival date & time: 05/20/18  1451     History   Chief Complaint Chief Complaint  Patient presents with  . Vaginal Bleeding    HPI Kristen Chung is a 32 y.o. female G85P2, LMP 9/15 with EGA of 6w/2d  Who presents with cc of vaginal bleedting. She had onser ofg  8/10 suprapubic pain upon wakening this morning. At 1:00 PM she noticed pink spotting on her toilet paper and underwear. She has also noticed increased urinary frequency and Left flank pain. The patient denies fever, chills, n/v/d/c. No previous miscarriages.    HPI  Past Medical History:  Diagnosis Date  . Anxiety   . BPPV (benign paroxysmal positional vertigo)   . Chlamydia   . Crohn disease ([place])   . Depression    No specific treatment  . Esophagitis   . Gastritis   . Genital warts   . Gonorrhea   . Hearing loss   . Heart murmur   . Hiatal hernia   . Kidney stone   . PID (acute pelvic inflammatory disease)   . Pregnancy induced hypertension   . Preterm labor   . Urinary tract infection     Patient Active Problem List   Diagnosis Date Noted  . Breast pain in female 05/13/2013  . Crohn disease ([place]) 05/13/2013  . Cocaine substance abuse ([place]) 05/13/2013  . Tobacco abuse counseling 05/13/2013  . Implanon in place 05/13/2013  . Contraception management 05/13/2013  . Health maintenance examination 05/13/2013  . Difficulty swallowing 05/13/2013  . 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 abortion     x2     OB History    Gravida  8   Para  2   Term  1   Preterm  1   AB  4   Living  2     SAB  1   TAB  3   Ectopic  0   Multiple  0   Live Births  2            Home Medications    Prior to Admission medications   Medication Sig Start Date End Date Taking? Authorizing Provider  amLODipine (NORVASC) 5 MG tablet Take 1 tablet (5 mg  total) by mouth daily. 12/06/17 01/05/18  Mortis, Alvie Heidelberg I, PA-C  diphenhydrAMINE (BENADRYL) 25 mg capsule Take 1 capsule (25 mg total) by mouth every 6 (six) hours as needed. 11/09/15   Gloriann Loan, PA-C  EPINEPHrine 0.3 mg/0.3 mL IJ SOAJ injection Inject 0.3 mLs (0.3 mg total) into the muscle once. 11/09/15   Gloriann Loan, PA-C  meloxicam (MOBIC) 7.5 MG tablet Take 2 tablets (15 mg total) by mouth daily. 04/19/17   Larene Pickett, PA-C  metroNIDAZOLE (FLAGYL) 500 MG tablet Take 1 tablet (500 mg total) by mouth 2 (two) times daily. 11/11/16   Leftwich-Kirby, Kathie Dike, CNM    Family History Family History  Problem Relation Age of Onset  . Asthma Mother   . Heart disease Mother   . Diabetes Mother   . Hypertension Mother   . Cancer Father        Breast  . Asthma Sister   . Depression Sister   . Diabetes Sister   . Hypertension Sister   . Diabetes Other   . Anesthesia problems Neg Hx     Social History Social  History   Tobacco Use  . Smoking status: Current Every Day Smoker    Packs/day: 0.25    Years: 6.00    Pack years: 1.50    Types: Cigarettes  . Smokeless tobacco: Never Used  Substance Use Topics  . Alcohol use: Yes    Comment: occ  . Drug use: Not Currently    Types: Cocaine, Marijuana     Allergies   Celery oil; Codone [hydrocodone]; Dilaudid [hydromorphone hcl]; and Strawberry extract   Review of Systems Review of Systems  Ten systems reviewed and are negative for acute change, except as noted in the HPI.   Physical Exam Updated Vital Signs BP (!) 145/106 (BP Location: Left Arm)   Pulse 73   Temp 98.5 F (36.9 C) (Oral)   Resp 16   Ht 5' 3"  (1.6 m)   Wt 71.2 kg   LMP 11/12/2017   SpO2 100%   BMI 27.79 kg/m   Physical Exam  Constitutional: She is oriented to person, place, and time. She appears well-developed and well-nourished. No distress.  HENT:  Head: Normocephalic and atraumatic.  Eyes: Conjunctivae are normal. No scleral icterus.  Neck:  Normal range of motion.  Cardiovascular: Normal rate, regular rhythm and normal heart sounds. Exam reveals no gallop and no friction rub.  No murmur heard. Pulmonary/Chest: Effort normal and breath sounds normal. No respiratory distress.  Abdominal: Soft. Bowel sounds are normal. She exhibits no distension and no mass. There is no tenderness. There is no guarding.  Genitourinary:  Genitourinary Comments: Uterine fundus palpable just above the symphysis.  Neurological: She is alert and oriented to person, place, and time.  Skin: Skin is warm and dry. She is not diaphoretic.  Psychiatric: Her behavior is normal.  Nursing note and vitals reviewed.    ED Treatments / Results  Labs (all labs ordered are listed, but only abnormal results are displayed) Labs Reviewed  BASIC METABOLIC PANEL  CBC WITH DIFFERENTIAL/PLATELET  HCG, QUANTITATIVE, PREGNANCY  ABO/RH    EKG None  Radiology No results found.  Procedures Procedures (including critical care time)  Medications Ordered in ED Medications - No data to display   Initial Impression / Assessment and Plan / ED Course  I have reviewed the triage vital signs and the nursing notes.  Pertinent labs & imaging results that were available during my care of the patient were reviewed by me and considered in my medical decision making (see chart for details).     Patient was seen on 10/23 at [facility] she had a benign pelvic examination at that time was found to have BV and treated Flagyl.  Patient states that she was unable to afford her $3 co-pay so I personally gave this patient her money to obtain her medications today.  Do not feel that I need to repeat the patient's pelvic exam today.  Her lab values show an elevation in her hCG from 32,958 to 119,217, and appropriate elevation..  Her ultrasound on the 23rd showed single IUP and again repeat ultrasound shows IUP with a heart rate of 142.  There is no chorionic hemorrhage.  She  has no active vomiting.  Benign ultrasound today.  She is advised to take Tylenol for her pain.  She appears appropriate for discharge at this time I discussed return precautions and appropriate follow-up for the patient. Final Clinical Impressions(s) / ED Diagnoses   Final diagnoses:  None    ED Discharge Orders    None  Margarita Mail, PA-C 05/20/18 2024    Little, Wenda Overland, MD 05/20/18 [phone]

## 2018-05-20 NOTE — ED Triage Notes (Addendum)
C/o vaginal bleeding x today-preg by PCP and [facility] ED test-waiting to go to OB-US 7 weeks-pt states she is G5 P2 EAb 2-NAD-steady gait

## 2018-05-22 IMAGING — CR DG CHEST 2V
2 series · 2 of 2 positions shown · non-contrast
Comparison: None.

CLINICAL DATA: Sternal pain on and off for 3 weeks

EXAM:
CHEST  2 VIEW

[w chest pa]
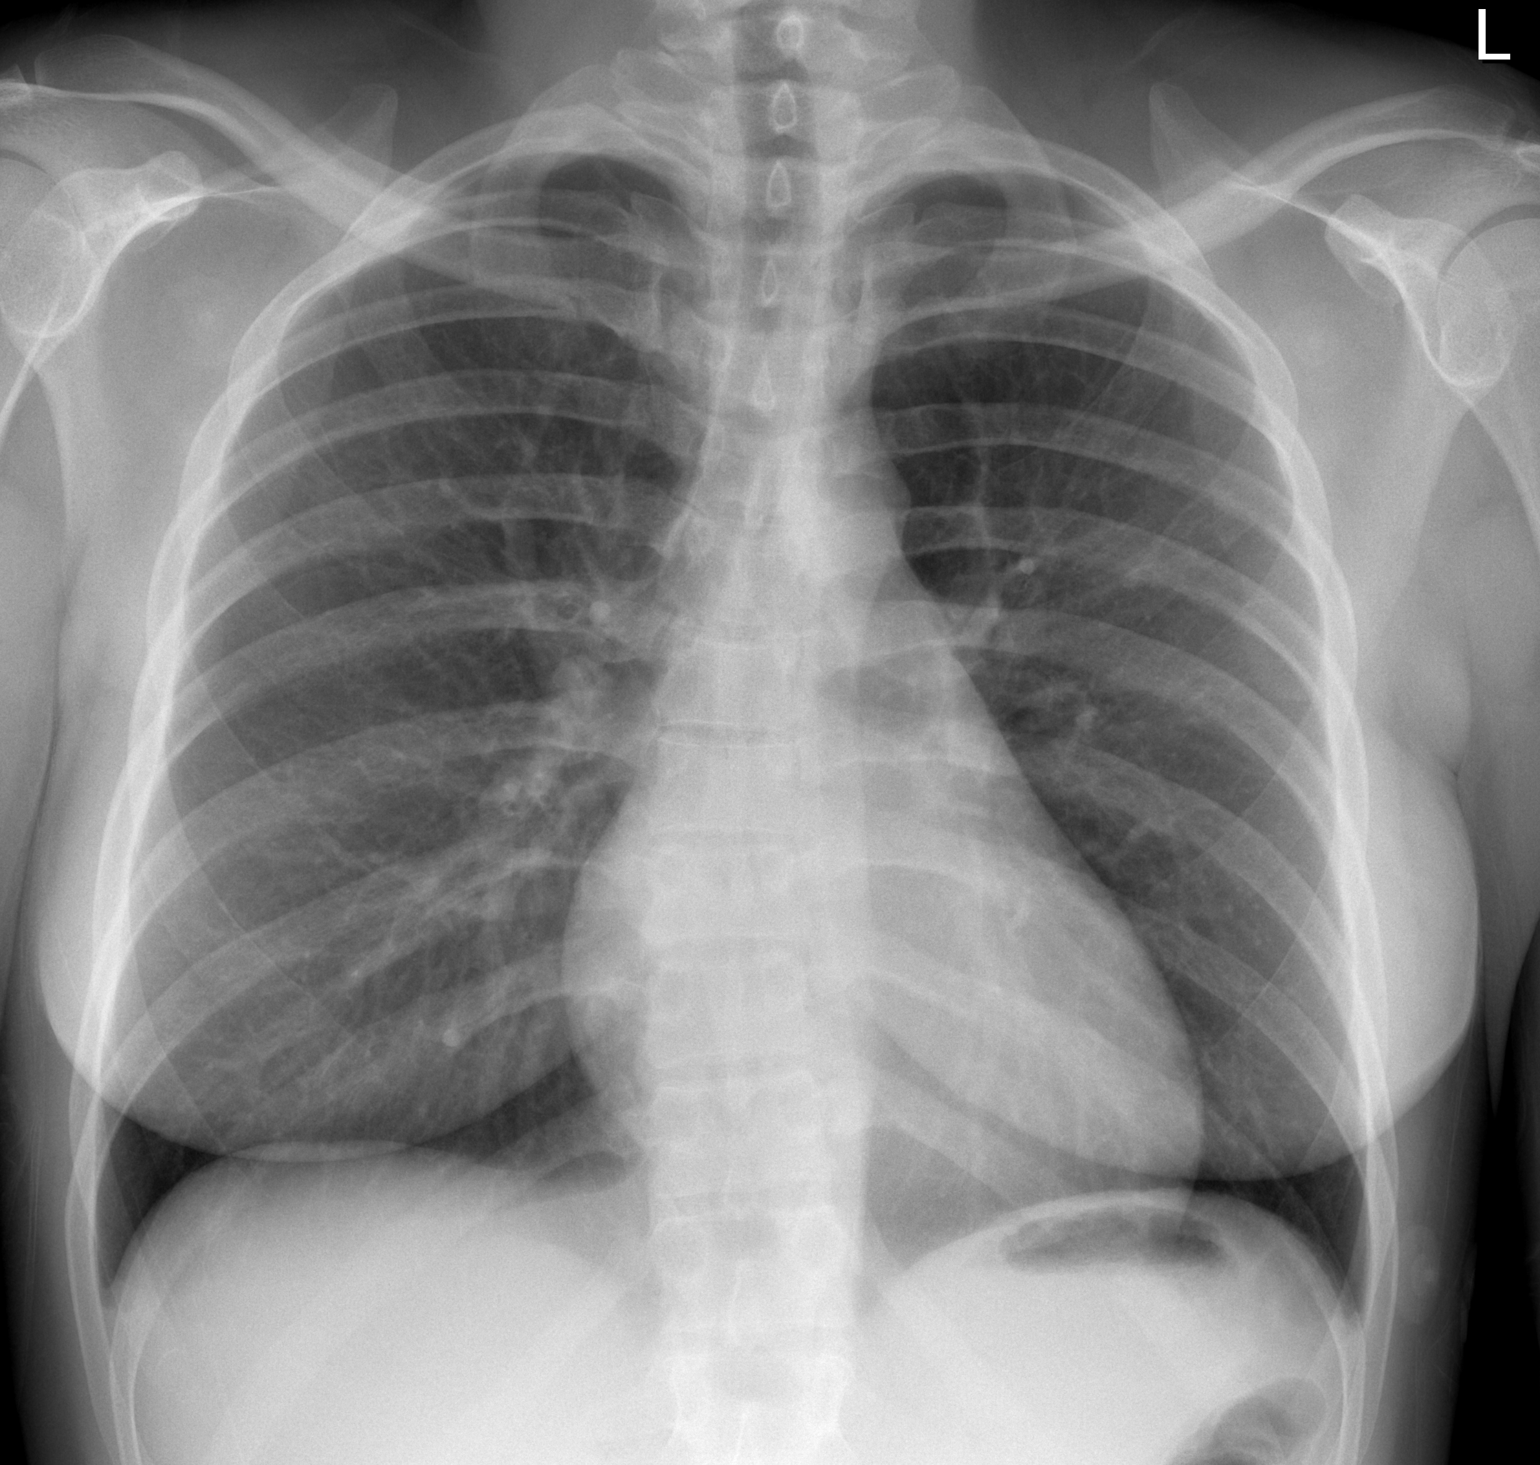

[w chest lat]
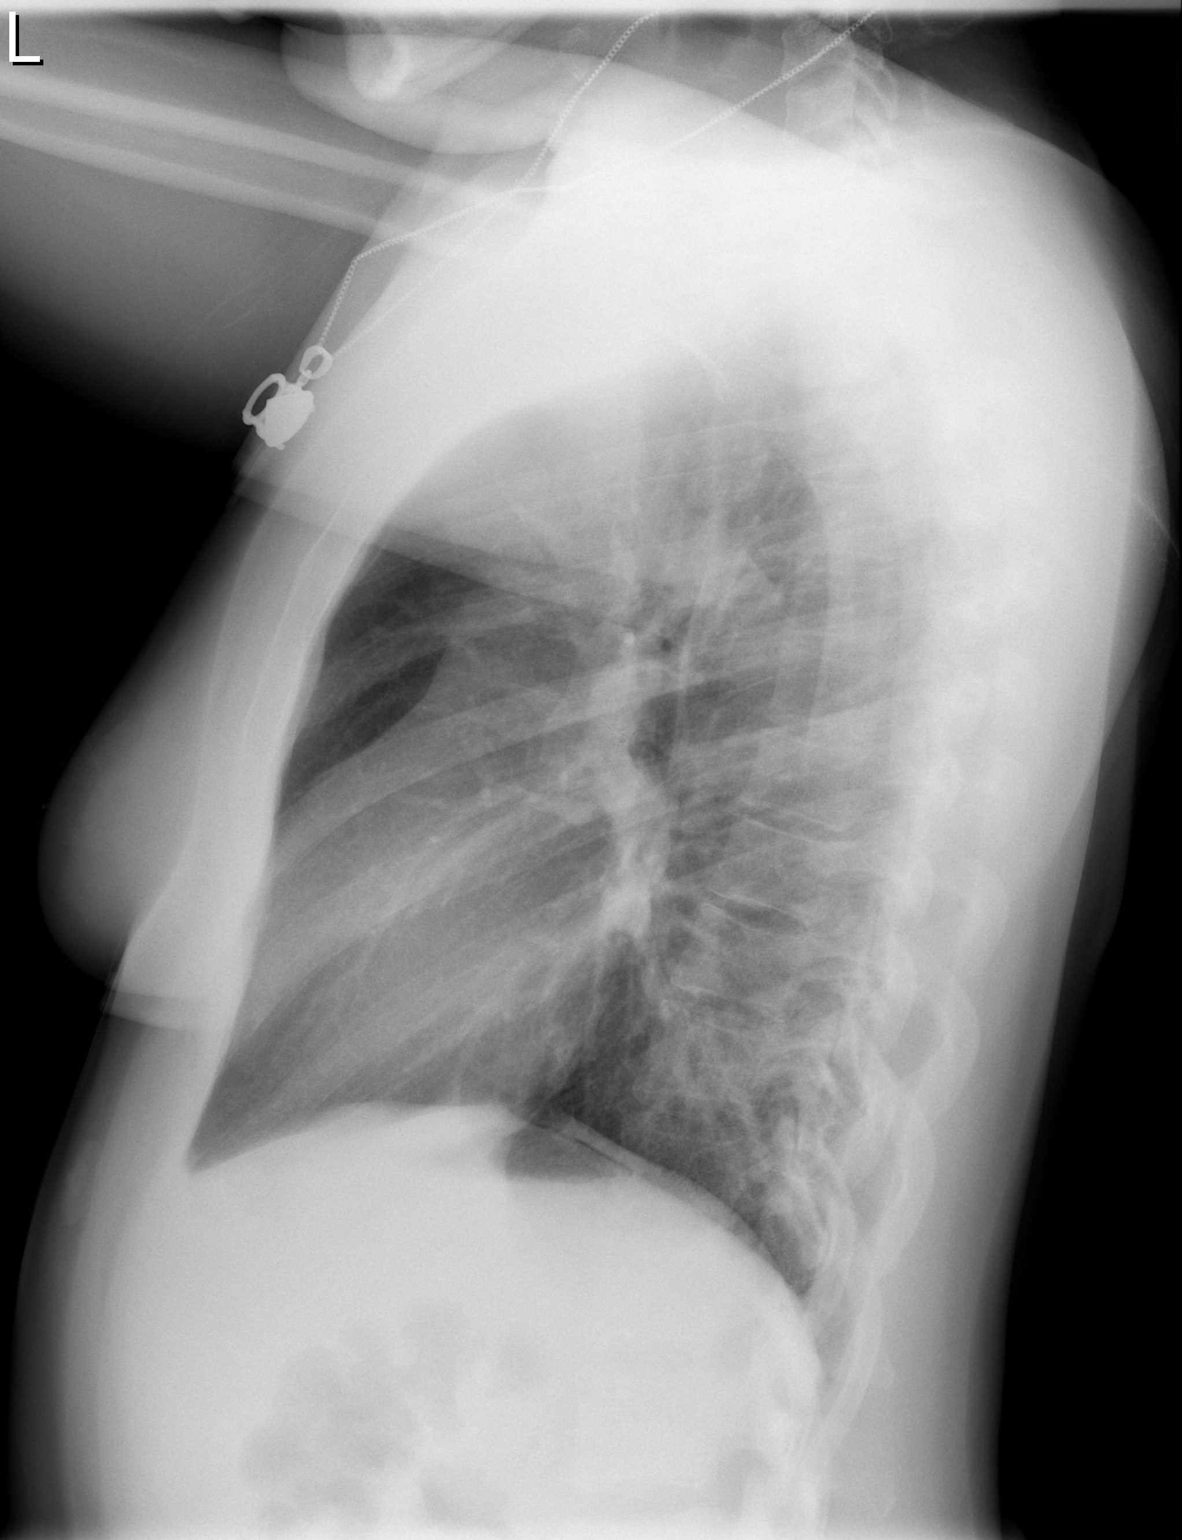

[2 of 2 positions shown; findings below may reference images not displayed]

FINDINGS: The heart size and mediastinal contours are within normal limits.
Both lungs are clear. The visualized skeletal structures are
unremarkable.
IMPRESSION: No active cardiopulmonary disease.

## 2018-07-21 ENCOUNTER — Encounter (HOSPITAL_BASED_OUTPATIENT_CLINIC_OR_DEPARTMENT_OTHER): Payer: Self-pay | Admitting: *Deleted

## 2018-07-21 ENCOUNTER — Other Ambulatory Visit: Payer: Self-pay

## 2018-07-21 DIAGNOSIS — R42 Dizziness and giddiness: Secondary | ICD-10-CM | POA: Insufficient documentation

## 2018-07-21 DIAGNOSIS — Z79899 Other long term (current) drug therapy: Secondary | ICD-10-CM | POA: Diagnosis not present

## 2018-07-21 DIAGNOSIS — F17210 Nicotine dependence, cigarettes, uncomplicated: Secondary | ICD-10-CM | POA: Insufficient documentation

## 2018-07-21 DIAGNOSIS — E876 Hypokalemia: Secondary | ICD-10-CM | POA: Insufficient documentation

## 2018-07-21 NOTE — ED Triage Notes (Signed)
Dizziness. Hx of vertigo. She took Meclizine this am and it relieved the symptoms. Tonight it came back.

## 2018-07-22 ENCOUNTER — Emergency Department (HOSPITAL_BASED_OUTPATIENT_CLINIC_OR_DEPARTMENT_OTHER)
Admission: EM | Admit: 2018-07-22 | Discharge: 2018-07-22 | Disposition: A | Discharge status: Home / Self Care | Payer: Medicaid Other | Attending: Emergency Medicine | Admitting: Emergency Medicine

## 2018-07-22 ENCOUNTER — Emergency Department (HOSPITAL_BASED_OUTPATIENT_CLINIC_OR_DEPARTMENT_OTHER): Payer: Medicaid Other

## 2018-07-22 DIAGNOSIS — R42 Dizziness and giddiness: Secondary | ICD-10-CM

## 2018-07-22 DIAGNOSIS — E876 Hypokalemia: Secondary | ICD-10-CM

## 2018-07-22 LAB — BASIC METABOLIC PANEL
Anion gap: 5 (ref 5–15)
BUN: 20 mg/dL (ref 6–20)
CALCIUM: 8.2 mg/dL — AB (ref 8.9–10.3)
CO2: 21 mmol/L — ABNORMAL LOW (ref 22–32)
CREATININE: 1.06 mg/dL — AB (ref 0.44–1.00)
Chloride: 108 mmol/L (ref 98–111)
GFR calc non Af Amer: >60 mL/min (ref >60)
Glucose, Bld: 114 mg/dL — ABNORMAL HIGH (ref 70–99)
Potassium: 2.9 mmol/L — ABNORMAL LOW (ref 3.5–5.1)
SODIUM: 134 mmol/L — AB (ref 135–145)

## 2018-07-22 LAB — CBC WITH DIFFERENTIAL/PLATELET
ABS IMMATURE GRANULOCYTES: 0.01 10*3/uL (ref 0.00–0.07)
Basophils Absolute: 0 10*3/uL (ref 0.0–0.1)
Basophils Relative: 0 %
Eosinophils Absolute: 0.1 10*3/uL (ref 0.0–0.5)
Eosinophils Relative: 2 %
HCT: 36.4 % (ref 36.0–46.0)
Hemoglobin: 11.3 g/dL — ABNORMAL LOW (ref 12.0–15.0)
Immature Granulocytes: 0 %
LYMPHS PCT: 53 %
Lymphs Abs: 2.4 10*3/uL (ref 0.7–4.0)
MCH: 27.4 pg (ref 26.0–34.0)
MCHC: 31 g/dL (ref 30.0–36.0)
MCV: 88.1 fL (ref 80.0–100.0)
Monocytes Absolute: 0.5 10*3/uL (ref 0.1–1.0)
Monocytes Relative: 10 %
NEUTROS ABS: 1.6 10*3/uL — AB (ref 1.7–7.7)
Neutrophils Relative %: 35 %
Platelets: 233 10*3/uL (ref 150–400)
RBC: 4.13 MIL/uL (ref 3.87–5.11)
RDW: 12.7 % (ref 11.5–15.5)
WBC: 4.6 10*3/uL (ref 4.0–10.5)
nRBC: 0 % (ref 0.0–0.2)

## 2018-07-22 LAB — HCG, SERUM, QUALITATIVE: Preg, Serum: NEGATIVE

## 2018-07-22 MED ORDER — SODIUM CHLORIDE 0.9 % IV BOLUS
1000.0000 mL | Freq: Once | INTRAVENOUS | Status: AC
  Administered 2018-07-22: 1000 mL via INTRAVENOUS

## 2018-07-22 MED ORDER — POTASSIUM CHLORIDE CRYS ER 20 MEQ PO TBCR: 20.0000 meq | EXTENDED_RELEASE_TABLET | Freq: Two times a day (BID) | ORAL | 0 refills | Status: DC

## 2018-07-22 MED ORDER — LORAZEPAM 2 MG/ML IJ SOLN
1.0000 mg | Freq: Once | INTRAMUSCULAR | Status: AC
  Administered 2018-07-22: 1 mg via INTRAVENOUS
  Filled 2018-07-22: qty 1

## 2018-07-22 NOTE — ED Notes (Signed)
Pt verbalized understanding of dc instructions.

## 2018-07-22 NOTE — Discharge Instructions (Signed)
Begin taking potassium as prescribed today.  Continue meclizine as previously prescribed.    Follow-up with your primary doctor in the next few days.  Return to the emergency department if your symptoms significantly worsen or change.

## 2018-07-22 NOTE — ED Provider Notes (Addendum)
[facility] EMERGENCY DEPARTMENT Provider Note   CSN: [id number] Arrival date & time: 07/21/18  2229     History   Chief Complaint Chief Complaint  Patient presents with  . Dizziness    HPI Kristen Chung is a 32 y.o. female.  Patient is a 32 year old female with past medical history of positional vertigo, Crohn's disease, depression.  She presents today for evaluation of dizziness.  She has been having episodes of vertigo intermittently for the past 3 years, however worse over the past several days.  She has been seen by ENT, her primary doctor, what sounds like vestibular rehab, and is due to see a cardiologist next month.  Today she turned her head and developed a spinning sensation with nausea and inability to keep her balance.  She took meclizine which helped somewhat and presents here for evaluation of this.  She expresses frustration that an answer for her dizziness has not been provided despite seeing multiple providers.  She tells me she has never had any imaging of her head.  The history is provided by the patient.  Dizziness  Quality:  Head spinning and imbalance Severity:  Moderate Onset quality:  Sudden Duration: 3 years. Timing:  Intermittent Progression:  Worsening Context: bending over and head movement   Relieved by:  Being still Worsened by:  Movement and turning head Ineffective treatments: Meclizine.   Past Medical History:  Diagnosis Date  . Anxiety   . BPPV (benign paroxysmal positional vertigo)   . Chlamydia   . Crohn disease ([place])   . Depression    No specific treatment  . Esophagitis   . Gastritis   . Genital warts   . Gonorrhea   . Hearing loss   . Heart murmur   . Hiatal hernia   . Kidney stone   . PID (acute pelvic inflammatory disease)   . Pregnancy induced hypertension   . Preterm labor   . Urinary tract infection     Patient Active Problem List   Diagnosis Date Noted  . Breast pain in female 05/13/2013  . Crohn  disease ([place]) 05/13/2013  . Cocaine substance abuse ([place]) 05/13/2013  . Tobacco abuse counseling 05/13/2013  . Implanon in place 05/13/2013  . Contraception management 05/13/2013  . Health maintenance examination 05/13/2013  . Difficulty swallowing 05/13/2013  . 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 abortion     x2     OB History    Gravida  8   Para  2   Term  1   Preterm  1   AB  4   Living  2     SAB  1   TAB  3   Ectopic  0   Multiple  0   Live Births  2            Home Medications    Prior to Admission medications   Medication Sig Start Date End Date Taking? Authorizing Provider  amLODipine (NORVASC) 5 MG tablet Take 1 tablet (5 mg total) by mouth daily. 12/06/17 07/21/18 Yes Mortis, Jonelle Sports, PA-C  diphenhydrAMINE (BENADRYL) 25 mg capsule Take 1 capsule (25 mg total) by mouth every 6 (six) hours as needed. 11/09/15  Yes Rose, Lonn Georgia, PA-C  MECLIZINE HCL PO Take by mouth.   Yes [provider]  EPINEPHrine 0.3 mg/0.3 mL IJ SOAJ injection Inject 0.3 mLs (0.3 mg total) into the muscle  once. 11/09/15   Gloriann Loan, PA-C  meloxicam (MOBIC) 7.5 MG tablet Take 2 tablets (15 mg total) by mouth daily. 04/19/17   Larene Pickett, PA-C  metroNIDAZOLE (FLAGYL) 500 MG tablet Take 1 tablet (500 mg total) by mouth 2 (two) times daily. 11/11/16   Leftwich-Kirby, Kathie Dike, CNM    Family History Family History  Problem Relation Age of Onset  . Asthma Mother   . Heart disease Mother   . Diabetes Mother   . Hypertension Mother   . Cancer Father        Breast  . Asthma Sister   . Depression Sister   . Diabetes Sister   . Hypertension Sister   . Diabetes Other   . Anesthesia problems Neg Hx     Social History Social History   Tobacco Use  . Smoking status: Current Every Day Smoker    Packs/day: 0.25    Years: 6.00    Pack years: 1.50    Types: Cigarettes  . Smokeless tobacco:  Never Used  Substance Use Topics  . Alcohol use: Yes    Comment: occ  . Drug use: Not Currently    Types: Cocaine, Marijuana     Allergies   Celery oil; Codone [hydrocodone]; Dilaudid [hydromorphone hcl]; and Strawberry extract   Review of Systems Review of Systems  Neurological: Positive for dizziness.  All other systems reviewed and are negative.    Physical Exam Updated Vital Signs BP (!) 149/108 (BP Location: Right Arm)   Pulse 72   Temp 98.3 F (36.8 C) (Oral)   Resp 16   Ht 5' 3.5" (1.613 m)   Wt 71.2 kg   LMP 06/30/2018   SpO2 100%   Breastfeeding Unknown   BMI 27.37 kg/m   Physical Exam Vitals signs and nursing note reviewed.  Constitutional:      General: She is not in acute distress.    Appearance: She is well-developed. She is not diaphoretic.  HENT:     Head: Normocephalic and atraumatic.     Right Ear: Tympanic membrane normal.     Left Ear: Tympanic membrane normal.  Eyes:     Extraocular Movements: Extraocular movements intact.     Pupils: Pupils are equal, round, and reactive to light.  Neck:     Musculoskeletal: Normal range of motion and neck supple.  Cardiovascular:     Rate and Rhythm: Normal rate and regular rhythm.     Heart sounds: No murmur. No friction rub. No gallop.   Pulmonary:     Effort: Pulmonary effort is normal. No respiratory distress.     Breath sounds: Normal breath sounds. No wheezing.  Abdominal:     General: Bowel sounds are normal. There is no distension.     Palpations: Abdomen is soft.     Tenderness: There is no abdominal tenderness.  Musculoskeletal: Normal range of motion.  Skin:    General: Skin is warm and dry.  Neurological:     Mental Status: She is alert and oriented to person, place, and time.     Cranial Nerves: No cranial nerve deficit.     Coordination: Coordination normal.      ED Treatments / Results  Labs (all labs ordered are listed, but only abnormal results are displayed) Labs Reviewed   BASIC METABOLIC PANEL  CBC WITH DIFFERENTIAL/PLATELET  HCG, SERUM, QUALITATIVE    EKG None  Radiology No results found.  Procedures Procedures (including critical care time)  Medications Ordered in  ED Medications  sodium chloride 0.9 % bolus 1,000 mL (has no administration in time range)  LORazepam (ATIVAN) injection 1 mg (has no administration in time range)     Initial Impression / Assessment and Plan / ED Course  I have reviewed the triage vital signs and the nursing notes.  Pertinent labs & imaging results that were available during my care of the patient were reviewed by me and considered in my medical decision making (see chart for details).  Patient with history of intermittent vertigo for the past several years.  She has seen multiple providers, however no definite cause has been found and her episodes continue.  She had another episode today where she became dizzy with a room spinning sensation.  Her work-up today reveals no significant abnormality except for a potassium of 2.9.  Her laboratory studies are otherwise unremarkable and head CT is negative.  She is feeling better after IV fluids and Ativan.  At this point, I feel as though she is appropriate for discharge.  She is to follow-up with her primary doctor.  Final Clinical Impressions(s) / ED Diagnoses   Final diagnoses:  None    ED Discharge Orders    None       Veryl Speak, MD 07/22/18 3507    Veryl Speak, MD 07/22/18 [phone]

## 2018-08-28 DIAGNOSIS — R0602 Shortness of breath: Secondary | ICD-10-CM | POA: Insufficient documentation

## 2018-09-23 ENCOUNTER — Emergency Department (HOSPITAL_BASED_OUTPATIENT_CLINIC_OR_DEPARTMENT_OTHER): Payer: Medicaid Other

## 2018-09-23 ENCOUNTER — Emergency Department (HOSPITAL_BASED_OUTPATIENT_CLINIC_OR_DEPARTMENT_OTHER)
Admission: EM | Admit: 2018-09-23 | Discharge: 2018-09-23 | Disposition: A | Discharge status: Home / Self Care | Payer: Medicaid Other | Attending: Emergency Medicine | Admitting: Emergency Medicine

## 2018-09-23 ENCOUNTER — Other Ambulatory Visit: Payer: Self-pay

## 2018-09-23 ENCOUNTER — Encounter (HOSPITAL_BASED_OUTPATIENT_CLINIC_OR_DEPARTMENT_OTHER): Payer: Self-pay | Admitting: Emergency Medicine

## 2018-09-23 DIAGNOSIS — Z3490 Encounter for supervision of normal pregnancy, unspecified, unspecified trimester: Secondary | ICD-10-CM

## 2018-09-23 DIAGNOSIS — R0981 Nasal congestion: Secondary | ICD-10-CM | POA: Diagnosis not present

## 2018-09-23 DIAGNOSIS — Z79899 Other long term (current) drug therapy: Secondary | ICD-10-CM | POA: Insufficient documentation

## 2018-09-23 DIAGNOSIS — R05 Cough: Secondary | ICD-10-CM | POA: Diagnosis not present

## 2018-09-23 DIAGNOSIS — F17210 Nicotine dependence, cigarettes, uncomplicated: Secondary | ICD-10-CM | POA: Insufficient documentation

## 2018-09-23 DIAGNOSIS — R079 Chest pain, unspecified: Secondary | ICD-10-CM

## 2018-09-23 LAB — TROPONIN I
Troponin I: <0.03 ng/mL (ref <0.03)
Troponin I: <0.03 ng/mL (ref <0.03)

## 2018-09-23 LAB — BASIC METABOLIC PANEL
Anion gap: 6 (ref 5–15)
BUN: 11 mg/dL (ref 6–20)
CO2: 23 mmol/L (ref 22–32)
Calcium: 8.8 mg/dL — ABNORMAL LOW (ref 8.9–10.3)
Chloride: 107 mmol/L (ref 98–111)
Creatinine, Ser: 0.76 mg/dL (ref 0.44–1.00)
GFR calc Af Amer: >60 mL/min (ref >60)
GFR calc non Af Amer: >60 mL/min (ref >60)
Glucose, Bld: 89 mg/dL (ref 70–99)
Potassium: 3.1 mmol/L — ABNORMAL LOW (ref 3.5–5.1)
SODIUM: 136 mmol/L (ref 135–145)

## 2018-09-23 LAB — URINALYSIS, ROUTINE W REFLEX MICROSCOPIC
Bilirubin Urine: NEGATIVE
GLUCOSE, UA: NEGATIVE mg/dL
Hgb urine dipstick: NEGATIVE
Ketones, ur: NEGATIVE mg/dL
Leukocytes,Ua: NEGATIVE
Nitrite: NEGATIVE
PROTEIN: NEGATIVE mg/dL
Specific Gravity, Urine: 1.025 (ref 1.005–1.030)
pH: 6.5 (ref 5.0–8.0)

## 2018-09-23 LAB — CBC
HCT: 37 % (ref 36.0–46.0)
Hemoglobin: 11.4 g/dL — ABNORMAL LOW (ref 12.0–15.0)
MCH: 27.2 pg (ref 26.0–34.0)
MCHC: 30.8 g/dL (ref 30.0–36.0)
MCV: 88.3 fL (ref 80.0–100.0)
Platelets: 267 10*3/uL (ref 150–400)
RBC: 4.19 MIL/uL (ref 3.87–5.11)
RDW: 12.7 % (ref 11.5–15.5)
WBC: 4.4 10*3/uL (ref 4.0–10.5)
nRBC: 0 % (ref 0.0–0.2)

## 2018-09-23 LAB — PREGNANCY, URINE: Preg Test, Ur: POSITIVE — AB

## 2018-09-23 MED ORDER — ACETAMINOPHEN 325 MG PO TABS
650.0000 mg | ORAL_TABLET | Freq: Once | ORAL | Status: AC
  Administered 2018-09-23: 650 mg via ORAL
  Filled 2018-09-23: qty 2

## 2018-09-23 MED ORDER — POTASSIUM CHLORIDE CRYS ER 20 MEQ PO TBCR
40.0000 meq | EXTENDED_RELEASE_TABLET | Freq: Once | ORAL | Status: AC
  Administered 2018-09-23: 40 meq via ORAL
  Filled 2018-09-23: qty 2

## 2018-09-23 NOTE — ED Notes (Signed)
Pt given saltines and peanut butter

## 2018-09-23 NOTE — ED Notes (Signed)
PT AMBULATED IN HALLWAY. OXYGEN SATURATION REMAINED AT 99%. PA [facility]) NOTIFIED.

## 2018-09-23 NOTE — ED Triage Notes (Addendum)
Reports chest pain since which began today around 2am.  Denies nausea, shortness of breath.  Endorses cocaine use-last used Friday.

## 2018-09-23 NOTE — ED Notes (Signed)
Pt c/o mid sternum chest pain x 2 days that woke her from sleep. Pt denies N/V, states pain increases when pressure applied to chest. Pt denies N/V, denies fever, state she has been sick recently and coughing

## 2018-09-23 NOTE — ED Notes (Signed)
Pt states she took 525m of tylenol at 0300 with little relief.

## 2018-09-23 NOTE — ED Notes (Signed)
Pt requesting something to drink. Verified ok with PA Sammie.

## 2018-09-23 NOTE — Discharge Instructions (Signed)
You were seen in the emergency department today for chest pain.  Your chest x-ray was normal.  Your EKG and heart enzyme were reassuring.  Your labs showed baseline anemia similar to prior labs you have had done.  Your potassium was somewhat low at 3.1, we have provided some oral potassium in the ER, be sure to see the attached handout for foods to help supplement your potassium as well.  This will need to be rechecked by primary care provider within a few days.  Your pregnancy test was positive.  As discussed you will need to start taking a prenatal vitamin, do not use cocaine, other illicit drugs, or alcohol, please be sure to check with the pharmacist regarding safety of any of your prescribed over-the-counter medicines before taking them, please take Tylenol for discomfort, please call an OB/GYN to establish care within the next couple of days.  Return to the ER immediately for new or worsening symptoms including but not limited to worsening chest discomfort, trouble breathing, coughing up blood, abdominal pain, vaginal bleeding, or any other concerns.

## 2018-09-23 NOTE — ED Provider Notes (Signed)
[facility] EMERGENCY DEPARTMENT Provider Note   CSN: [id number] Arrival date & time: 09/23/18  5361    History   Chief Complaint Chief Complaint  Patient presents with  . Chest Pain    HPI Kristen Chung is a 33 y.o. female with a hx of BPPV, crohn disease, anxiety, depression, eosphagitis, hiatal hernia, pregnancy induced HTN, and cocaine abuse who presents to the ED with complaints of chest pain that began yesterday afternoon. Patient reports pain is located in the central chest, it is constant, worse with movement, palpation, and deep breath, no alleviating factors. No intervention PTA. She notes she has had about 1 week of congestion & cough productive of phlegm sputum. She is unsure if she was coughing at onset. Denies fever, chills, emesis, dyspnea, leg swelling, hemoptysis, recent surgery/trauma, recent long travel, hormone use, personal hx of cancer, or hx of DVT/PE.  Unknown LMP, currently sexually active. Hx of cocaine use- last use was 4 days prior. Occasional alcohol use.         HPI  Past Medical History:  Diagnosis Date  . Anxiety   . BPPV (benign paroxysmal positional vertigo)   . Chlamydia   . Crohn disease ([place])   . Depression    No specific treatment  . Esophagitis   . Gastritis   . Genital warts   . Gonorrhea   . Hearing loss   . Heart murmur   . Hiatal hernia   . Kidney stone   . PID (acute pelvic inflammatory disease)   . Pregnancy induced hypertension   . Preterm labor   . Urinary tract infection     Patient Active Problem List   Diagnosis Date Noted  . Breast pain in female 05/13/2013  . Crohn disease ([place]) 05/13/2013  . Cocaine substance abuse ([place]) 05/13/2013  . Tobacco abuse counseling 05/13/2013  . Implanon in place 05/13/2013  . Contraception management 05/13/2013  . Health maintenance examination 05/13/2013  . Difficulty swallowing 05/13/2013  . 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 abortion     x2     OB History    Gravida  8   Para  2   Term  1   Preterm  1   AB  4   Living  2     SAB  1   TAB  3   Ectopic  0   Multiple  0   Live Births  2            Home Medications    Prior to Admission medications   Medication Sig Start Date End Date Taking? Authorizing Provider  amLODipine (NORVASC) 5 MG tablet Take 1 tablet (5 mg total) by mouth daily. 12/06/17 07/21/18  Mortis, Alvie Heidelberg I, PA-C  diphenhydrAMINE (BENADRYL) 25 mg capsule Take 1 capsule (25 mg total) by mouth every 6 (six) hours as needed. 11/09/15   Gloriann Loan, PA-C  EPINEPHrine 0.3 mg/0.3 mL IJ SOAJ injection Inject 0.3 mLs (0.3 mg total) into the muscle once. 11/09/15   Gloriann Loan, PA-C  MECLIZINE HCL PO Take by mouth.    [provider]  meloxicam (MOBIC) 7.5 MG tablet Take 2 tablets (15 mg total) by mouth daily. 04/19/17   Larene Pickett, PA-C  metroNIDAZOLE (FLAGYL) 500 MG tablet Take 1 tablet (500 mg total) by mouth 2 (two) times daily. 11/11/16   Leftwich-Kirby, Kathie Dike, CNM  potassium chloride  SA (K-DUR,KLOR-CON) 20 MEQ tablet Take 1 tablet (20 mEq total) by mouth 2 (two) times daily. 07/22/18   Veryl Speak, MD    Family History Family History  Problem Relation Age of Onset  . Asthma Mother   . Heart disease Mother   . Diabetes Mother   . Hypertension Mother   . Cancer Father        Breast  . Asthma Sister   . Depression Sister   . Diabetes Sister   . Hypertension Sister   . Diabetes Other   . Anesthesia problems Neg Hx     Social History Social History   Tobacco Use  . Smoking status: Current Every Day Smoker    Packs/day: 0.25    Years: 6.00    Pack years: 1.50    Types: Cigarettes  . Smokeless tobacco: Never Used  Substance Use Topics  . Alcohol use: Yes    Comment: occ  . Drug use: Not Currently    Types: Cocaine, Marijuana     Allergies   Celery oil; Codone [hydrocodone]; Dilaudid [hydromorphone  hcl]; and Strawberry extract   Review of Systems Review of Systems  Constitutional: Negative for chills and fever.  HENT: Positive for congestion. Negative for sore throat, trouble swallowing and voice change.   Respiratory: Positive for cough. Negative for shortness of breath.   Cardiovascular: Positive for chest pain. Negative for palpitations and leg swelling.  Gastrointestinal: Negative for abdominal pain, diarrhea, nausea and vomiting.  Genitourinary: Negative for dysuria and vaginal bleeding.  Neurological: Negative for syncope, weakness and numbness.  All other systems reviewed and are negative.    Physical Exam Updated Vital Signs BP 124/83 (BP Location: Left Arm)   Pulse 68   Temp 97.6 F (36.4 C) (Oral)   Resp 16   Ht 5' 2"  (1.575 m)   Wt 74.4 kg   LMP  (LMP Unknown)   SpO2 100%   BMI 30.00 kg/m   Physical Exam Vitals signs and nursing note reviewed.  Constitutional:      General: She is not in acute distress.    Appearance: She is well-developed.  HENT:     Head: Normocephalic and atraumatic.     Right Ear: Tympanic membrane is not perforated, erythematous, retracted or bulging.     Left Ear: Tympanic membrane is not perforated, erythematous, retracted or bulging.     Mouth/Throat:     Pharynx: Uvula midline. No oropharyngeal exudate or posterior oropharyngeal erythema.  Eyes:     General:        Right eye: No discharge.        Left eye: No discharge.     Conjunctiva/sclera: Conjunctivae normal.     Pupils: Pupils are equal, round, and reactive to light.  Neck:     Musculoskeletal: Normal range of motion and neck supple.  Cardiovascular:     Rate and Rhythm: Normal rate and regular rhythm.     Pulses:          Radial pulses are 2+ on the right side and 2+ on the left side.       Dorsalis pedis pulses are 2+ on the right side and 2+ on the left side.     Heart sounds: No murmur.  Pulmonary:     Effort: Pulmonary effort is normal. No respiratory  distress.     Breath sounds: Normal breath sounds. No wheezing, rhonchi or rales.  Chest:     Chest wall: Tenderness (anterior chest wall  which reproduces patient's pain, no overying skin changes or palpable crepitus) present.  Abdominal:     Tenderness: There is no abdominal tenderness.     Comments: Gravid appearing abdomen.   Musculoskeletal:     Right lower leg: No edema.     Left lower leg: No edema.     Comments: No calf tenderness.   Lymphadenopathy:     Cervical: No cervical adenopathy.  Skin:    General: Skin is warm and dry.     Findings: No rash.  Neurological:     Mental Status: She is alert.  Psychiatric:        Behavior: Behavior normal.     ED Treatments / Results  Labs (all labs ordered are listed, but only abnormal results are displayed) Labs Reviewed  BASIC METABOLIC PANEL - Abnormal; Notable for the following components:      Result Value   Potassium 3.1 (*)    Calcium 8.8 (*)    All other components within normal limits  CBC - Abnormal; Notable for the following components:   Hemoglobin 11.4 (*)    All other components within normal limits  PREGNANCY, URINE - Abnormal; Notable for the following components:   Preg Test, Ur POSITIVE (*)    All other components within normal limits  TROPONIN I  URINALYSIS, ROUTINE W REFLEX MICROSCOPIC  TROPONIN I    EKG EKG Interpretation  Date/Time:  Tuesday September 23 2018 09:56:52 EST Ventricular Rate:  68 PR Interval:  154 QRS Duration: 82 QT Interval:  418 QTC Calculation: 444 R Axis:   73 Text Interpretation:  Normal sinus rhythm Septal infarct , age undetermined Abnormal ECG When compared to prior, t wave inversion in lead 3.  No STEMI Confirmed by Antony Blackbird [phone]) on 09/23/2018 10:06:16 AM   Radiology Dg Chest 2 View  Result Date: 09/23/2018 CLINICAL DATA:  Chest pain. EXAM: CHEST - 2 VIEW COMPARISON:  12/06/2017 FINDINGS: Normal heart size. No pleural effusion or edema. No airspace opacities  identified. The visualized osseous structures are unremarkable. IMPRESSION: 1. No acute cardiopulmonary abnormalities. Electronically Signed   By: Kerby Moors M.D.   On: 09/23/2018 11:51    Procedures Procedures (including critical care time)  Medications Ordered in ED Medications - No data to display   Initial Impression / Assessment and Plan / ED Course  I have reviewed the triage vital signs and the nursing notes.  Pertinent labs & imaging results that were available during my care of the patient were reviewed by me and considered in my medical decision making (see chart for details).   Patient presents to the ED with complaints of chest pain. Patient nontoxic appearing, in no apparent distress, vitals WNL. On exam patient heart w/ RRR, lungs CTA. Her chest pain is reproducible with chest wall palpation. Of note she has a gravid appearing abdomen with unknown LMP. Will start with EKG & labs to include troponin & CXR.   Work-up reviewed thus far:  CBC: Baseline anemia. No leukocytosis.  BMP: Mild hypokalemia @ 3.1- diet & oral replacement. Mild hypocalcemia @ 8.8- diet replacement.  Initial troponin: negative EKG: No STEMI Pregnancy test: Positive - denies abdominal/pelvic pain or vaginal bleeding therefore do not feel that pelvic & [place] are necessary at this time- we discussed starting prenatals, etoh/drug use cessation, and obgyn follow up.   We subsequently discussed risks/benefits of CXR in regards to patient & fetus- patient is requesting we proceed.   CXR: Negative for infiltrate, pneumothorax,  effusion, edema, or fracture/dislocation.   Low risk heart score, delta troponin negative, doubt ACS.  Patient is currently pregnant did consider pulmonary embolism, however patient pain is reproducible with chest wall palpation, she is not hypoxic or tachycardic, she was ambulatory with SPO2 maintaining at 99%, therefore doubt PE.  Pain is reproducible, no widening of the mediastinum on  chest x-ray, symmetric distal pulses, doubt dissection.  Pain seems musculoskeletal in nature.  Somewhat improved with Tylenol.  Will discharge home with recommendation for continued Tylenol use.  OB/GYN follow-up as discussed above.  PCP follow-up for chest discomfort. I discussed results, treatment plan, need for follow-up, and return precautions with the patient. Provided opportunity for questions, patient confirmed understanding and is in agreement with plan.   Findings and plan of care discussed with supervising physician Dr. Sherry Ruffing who is in agreement.   Vitals:   09/23/18 0957 09/23/18 1227  BP: 124/83 112/69  Pulse: 68 65  Resp: 16 15  Temp: 97.6 F (36.4 C)   SpO2: 100% 100%     Final Clinical Impressions(s) / ED Diagnoses   Final diagnoses:  Chest pain, unspecified type  Pregnancy, unspecified gestational age    ED Discharge Orders    None       Amaryllis Dyke, PA-C 09/23/18 1502    Tegeler, Gwenyth Allegra, MD 09/23/18 [phone]

## 2018-09-24 ENCOUNTER — Encounter (HOSPITAL_COMMUNITY): Payer: Self-pay | Admitting: *Deleted

## 2018-09-24 ENCOUNTER — Emergency Department (HOSPITAL_COMMUNITY)
Admission: EM | Admit: 2018-09-24 | Discharge: 2018-09-24 | Disposition: A | Discharge status: Home / Self Care | Payer: Medicaid Other | Attending: Emergency Medicine | Admitting: Emergency Medicine

## 2018-09-24 DIAGNOSIS — F17210 Nicotine dependence, cigarettes, uncomplicated: Secondary | ICD-10-CM | POA: Insufficient documentation

## 2018-09-24 DIAGNOSIS — O99330 Smoking (tobacco) complicating pregnancy, unspecified trimester: Secondary | ICD-10-CM | POA: Insufficient documentation

## 2018-09-24 DIAGNOSIS — Z3201 Encounter for pregnancy test, result positive: Secondary | ICD-10-CM | POA: Diagnosis present

## 2018-09-24 DIAGNOSIS — Z3490 Encounter for supervision of normal pregnancy, unspecified, unspecified trimester: Secondary | ICD-10-CM | POA: Diagnosis not present

## 2018-09-24 NOTE — ED Provider Notes (Signed)
[facility] EMERGENCY DEPARTMENT Provider Note   CSN: [id number] Arrival date & time: 09/24/18  1975    History   Chief Complaint Chief Complaint  Patient presents with  . Possible Pregnancy    HPI Kristen Chung is a 33 y.o. femalewith a PMH of depression and anxiety presenting with concerns about how far along she is in her pregnancy. Patient reports she had a positive pregnancy test yesterday. Patient states her LMP may have been in December or January. Patient states she does not want to continue with the pregnancy at this time. Patient denies abdominal pain, vomiting, nausea, diarrhea, vaginal bleeding, or dysuria. Patient denies any acute complaints.      HPI  Past Medical History:  Diagnosis Date  . Anxiety   . BPPV (benign paroxysmal positional vertigo)   . Chlamydia   . Crohn disease ([place])   . Depression    No specific treatment  . Esophagitis   . Gastritis   . Genital warts   . Gonorrhea   . Hearing loss   . Heart murmur   . Hiatal hernia   . Kidney stone   . PID (acute pelvic inflammatory disease)   . Pregnancy induced hypertension   . Preterm labor   . Urinary tract infection     Patient Active Problem List   Diagnosis Date Noted  . Breast pain in female 05/13/2013  . Crohn disease ([place]) 05/13/2013  . Cocaine substance abuse ([place]) 05/13/2013  . Tobacco abuse counseling 05/13/2013  . Implanon in place 05/13/2013  . Contraception management 05/13/2013  . Health maintenance examination 05/13/2013  . Difficulty swallowing 05/13/2013  . 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 abortion     x2     OB History    Gravida  9   Para  2   Term  1   Preterm  1   AB  4   Living  2     SAB  1   TAB  3   Ectopic  0   Multiple  0   Live Births  2            Home Medications    Prior to Admission medications   Medication Sig Start Date End Date  Taking? Authorizing Provider  amLODipine (NORVASC) 5 MG tablet Take 1 tablet (5 mg total) by mouth daily. 12/06/17 07/21/18  Mortis, Alvie Heidelberg I, PA-C  diphenhydrAMINE (BENADRYL) 25 mg capsule Take 1 capsule (25 mg total) by mouth every 6 (six) hours as needed. 11/09/15   Gloriann Loan, PA-C  EPINEPHrine 0.3 mg/0.3 mL IJ SOAJ injection Inject 0.3 mLs (0.3 mg total) into the muscle once. 11/09/15   Gloriann Loan, PA-C  MECLIZINE HCL PO Take by mouth.    [provider]  meloxicam (MOBIC) 7.5 MG tablet Take 2 tablets (15 mg total) by mouth daily. 04/19/17   Larene Pickett, PA-C  metroNIDAZOLE (FLAGYL) 500 MG tablet Take 1 tablet (500 mg total) by mouth 2 (two) times daily. 11/11/16   Leftwich-Kirby, Kathie Dike, CNM  potassium chloride SA (K-DUR,KLOR-CON) 20 MEQ tablet Take 1 tablet (20 mEq total) by mouth 2 (two) times daily. 07/22/18   Veryl Speak, MD    Family History Family History  Problem Relation Age of Onset  . Asthma Mother   . Heart disease Mother   . Diabetes Mother   . Hypertension Mother   .  Cancer Father        Breast  . Asthma Sister   . Depression Sister   . Diabetes Sister   . Hypertension Sister   . Diabetes Other   . Anesthesia problems Neg Hx     Social History Social History   Tobacco Use  . Smoking status: Current Every Day Smoker    Packs/day: 0.25    Years: 6.00    Pack years: 1.50    Types: Cigarettes  . Smokeless tobacco: Never Used  Substance Use Topics  . Alcohol use: Yes    Comment: occ-last use last week 09/16/18  . Drug use: Not Currently    Types: Cocaine, Marijuana    Comment: cocaine use last week     Allergies   Celery oil; Codone [hydrocodone]; Dilaudid [hydromorphone hcl]; and Strawberry extract   Review of Systems Review of Systems  Constitutional: Negative for chills, diaphoresis and fever.  HENT: Negative for congestion and rhinorrhea.   Respiratory: Negative for shortness of breath.   Cardiovascular: Negative for chest pain.    Gastrointestinal: Negative for abdominal pain, nausea and vomiting.  Endocrine: Negative for cold intolerance and heat intolerance.  Genitourinary: Negative for dysuria, genital sores, pelvic pain, vaginal bleeding, vaginal discharge and vaginal pain.  Musculoskeletal: Negative for back pain.  Skin: Negative for rash.  Hematological: Negative for adenopathy.    Physical Exam Updated Vital Signs BP 131/88 (BP Location: Right Arm)   Pulse 73   Temp 98.1 F (36.7 C) (Oral)   Resp 14   Ht 5' 3"  (1.6 m)   Wt 74.4 kg   LMP  (LMP Unknown)   SpO2 100%   BMI 29.05 kg/m   Physical Exam Vitals signs and nursing note reviewed.  Constitutional:      General: She is not in acute distress.    Appearance: She is well-developed. She is not diaphoretic.  HENT:     Head: Normocephalic and atraumatic.  Cardiovascular:     Rate and Rhythm: Normal rate and regular rhythm.     Heart sounds: Normal heart sounds. No murmur. No friction rub. No gallop.   Pulmonary:     Effort: Pulmonary effort is normal. No respiratory distress.     Breath sounds: Normal breath sounds. No wheezing or rales.  Abdominal:     Palpations: Abdomen is soft.     Tenderness: There is no abdominal tenderness.  Musculoskeletal: Normal range of motion.  Skin:    General: Skin is warm.     Findings: No erythema or rash.  Neurological:     Mental Status: She is alert and oriented to person, place, and time.     ED Treatments / Results  Labs (all labs ordered are listed, but only abnormal results are displayed) Labs Reviewed - No data to display  EKG None  Radiology Dg Chest 2 View  Result Date: 09/23/2018 CLINICAL DATA:  Chest pain. EXAM: CHEST - 2 VIEW COMPARISON:  12/06/2017 FINDINGS: Normal heart size. No pleural effusion or edema. No airspace opacities identified. The visualized osseous structures are unremarkable. IMPRESSION: 1. No acute cardiopulmonary abnormalities. Electronically Signed   By: Kerby Moors M.D.   On: 09/23/2018 11:51    Procedures Procedures (including critical care time)  Medications Ordered in ED Medications - No data to display   Initial Impression / Assessment and Plan / ED Course  I have reviewed the triage vital signs and the nursing notes.  Pertinent labs & imaging results that were  available during my care of the patient were reviewed by me and considered in my medical decision making (see chart for details).       Patient presents requesting dating for her pregnancy. Patient denies any acute complaints. Patient had a pregnancy test yesterday in the ER that was positive. Do not suspect an ultrasound in the ER is appropriate at this time. Will advise patient to follow with OBGYN or the pregnancy center. Provided resources for patient. Patient states she understands and agrees with plan.   Final Clinical Impressions(s) / ED Diagnoses   Final diagnoses:  Pregnancy, unspecified gestational age    ED Discharge Orders    None       Arville Lime, [place] 09/24/18 1043    Tegeler, Gwenyth Allegra, MD 09/24/18 [phone]

## 2018-09-24 NOTE — Discharge Instructions (Addendum)
You have been seen today for pregnancy dating. Please read and follow all provided instructions.   1. Medications: usual home medications 2. Treatment: rest, drink plenty of fluids 3. Follow Up: Please follow up with OBGYN or pregnancy center for pregnancy care. Please follow up with your primary doctor in 7 days for discussion of your diagnoses and further evaluation after today's visit; if you do not have a primary care doctor use the resource guide provided to find one; Please return to the ER for any new or worsening symptoms. Please obtain all of your results from medical records or have your doctors office obtain the results - share them with your doctor - you should be seen at your doctors office. Call today to arrange your follow up.   Take medications as prescribed. Please review all of the medicines and only take them if you do not have an allergy to them. Return to the emergency room for worsening condition or new concerning symptoms. Follow up with your regular doctor. If you don't have a regular doctor use one of the numbers below to establish a primary care doctor.  Please be aware that if you are taking birth control pills, taking other prescriptions, ESPECIALLY ANTIBIOTICS may make the birth control ineffective - if this is the case, either do not engage in sexual activity or use alternative methods of birth control such as condoms until you have finished the medicine and your family doctor says it is OK to restart them. If you are on a blood thinner such as COUMADIN, be aware that any other medicine that you take may cause the coumadin to either work too much, or not enough - you should have your coumadin level rechecked in next 7 days if this is the case.  ?  It is also a possibility that you have an allergic reaction to any of the medicines that you have been prescribed - Everybody reacts differently to medications and while MOST people have no trouble with most medicines, you may have a  reaction such as nausea, vomiting, rash, swelling, shortness of breath. If this is the case, please stop taking the medicine immediately and contact your physician.  ?  You should return to the ER if you develop severe or worsening symptoms.   Emergency Department Resource Guide 1) Find a Doctor and Pay Out of Pocket Although you won't have to find out who is covered by your insurance plan, it is a good idea to ask around and get recommendations. You will then need to call the office and see if the doctor you have chosen will accept you as a new patient and what types of options they offer for patients who are self-pay. Some doctors offer discounts or will set up payment plans for their patients who do not have insurance, but you will need to ask so you aren't surprised when you get to your appointment.  2) Contact Your Local Health Department Not all health departments have doctors that can see patients for sick visits, but many do, so it is worth a call to see if yours does. If you don't know where your local health department is, you can check in your phone book. The CDC also has a tool to help you locate your state's health department, and many state websites also have listings of all of their local health departments.  3) Find a [facility] If your illness is not likely to be very severe or complicated, you may want to  try a walk in clinic. These are popping up all over the country in pharmacies, drugstores, and shopping centers. They're usually staffed by nurse practitioners or physician assistants that have been trained to treat common illnesses and complaints. They're usually fairly quick and inexpensive. However, if you have serious medical issues or chronic medical problems, these are probably not your best option.  No Primary Care Doctor: Call Health Connect at  [phone] - they can help you locate a primary care doctor that  accepts your insurance, provides certain services,  etc. Physician Referral [phone]  Emergency Department Resource Guide 1) Find a Doctor and Pay Out of Pocket Although you won't have to find out who is covered by your insurance plan, it is a good idea to ask around and get recommendations. You will then need to call the office and see if the doctor you have chosen will accept you as a new patient and what types of options they offer for patients who are self-pay. Some doctors offer discounts or will set up payment plans for their patients who do not have insurance, but you will need to ask so you aren't surprised when you get to your appointment.  2) Contact Your Local Health Department Not all health departments have doctors that can see patients for sick visits, but many do, so it is worth a call to see if yours does. If you don't know where your local health department is, you can check in your phone book. The CDC also has a tool to help you locate your state's health department, and many state websites also have listings of all of their local health departments.  3) Find a [facility] If your illness is not likely to be very severe or complicated, you may want to try a walk in clinic. These are popping up all over the country in pharmacies, drugstores, and shopping centers. They're usually staffed by nurse practitioners or physician assistants that have been trained to treat common illnesses and complaints. They're usually fairly quick and inexpensive. However, if you have serious medical issues or chronic medical problems, these are probably not your best option.  No Primary Care Doctor: Call Health Connect at  [phone] - they can help you locate a primary care doctor that  accepts your insurance, provides certain services, etc. Physician Referral Service- [phone]  Chronic Pain Problems: Organization         Address  Phone   Notes  [facility]  [phone] Patients need to be referred by their  primary care doctor.   Medication Assistance: Organization         Address  Phone   Notes  [facility] Medication [facility] [address], [place] [address] [phone] --Must be a resident of [facility] -- Must have NO insurance coverage whatsoever (no Medicaid/ Medicare, etc.) -- The pt. MUST have a primary care doctor that directs their care regularly and follows them in the community   [facility]  [phone]   [employer]  [phone]    Agencies that provide inexpensive medical care: Organization         Address  Phone   Notes  [place]  [phone]   [facility]    [phone]   [facility] [place], [place] [address] [phone]   [place] [address] [place] [phone]  [facility]anned [facility]    [phone]   [facility]    [phone]   [facility] and [address], [place] Phone:  [phone], Fax:  [phone] Hours of Operation:  9 am - 6 pm, M-F.  Also accepts Medicaid/Medicare and self-pay.  [facility] for [place], [address], [place] Phone: [phone], Fax: [phone]. Hours of Operation:  8:30 am - 5:30 pm, M-F.  Also accepts Medicaid and self-pay.  [facility] [place] [address], [place] Phone: [phone]   [place], [place], [place] [phone], [address]. [phone] Mondays & Thursdays: 7-9 AM.  First 15 patients are seen on a first come, first serve basis.    [place] Providers:  Organization         Address  Phone   Notes  [facility] [address],  [phone] Also accepts self-pay patients.  [facility] [address], [place]  [phone]   [place], [address], [place] [phone]   [facility] [address] [place] [phone]   Lucianne Lei [address], [place]   [phone] Only accepts [place] patients after they have their name applied to their card.   Self-Pay (no insurance) in [facility]:  Organization         Address  Phone   Notes  Sickle Cell Patients, [facility] [place] [phone]   [facility] [place] [phone]   [facility] [place]  [place], [place], [place] [phone]   [facility]/Dr. Osei-Bonsu  [address] [place] or [address], [place] [phone] Phone number for both [place] and [place] locations is the same.  Urgent Medical and [facility] [address], [place] [phone]   [facility] [address] [place] or [address] [phone]   [facility] [address], [place] [phone], phone; [phone], fax Sees patients 1st and 3rd Saturday of every month.  Must not qualify for public or private insurance (i.e. Medicaid, Medicare, [place] [employer] Choice, Veterans' Benefits)  Household income should be no more than 200% of the poverty level The clinic cannot treat you if you are pregnant or think you are pregnant  Sexually transmitted diseases are not treated at the clinic.

## 2018-09-24 NOTE — ED Notes (Signed)
Declined W/C at D/C and was escorted to lobby by RN. 

## 2018-09-24 NOTE — ED Triage Notes (Signed)
Pt in requests knowing how far along she is in her pregancy and wants to know how far along she is because she wants an abortion, pt unable to state LMP, denies vaginal bleeding, A&O x4

## 2019-04-22 ENCOUNTER — Ambulatory Visit (INDEPENDENT_AMBULATORY_CARE_PROVIDER_SITE_OTHER): Payer: Medicaid Other

## 2019-04-22 ENCOUNTER — Other Ambulatory Visit: Payer: Self-pay

## 2019-04-22 DIAGNOSIS — Z3480 Encounter for supervision of other normal pregnancy, unspecified trimester: Secondary | ICD-10-CM

## 2019-04-22 DIAGNOSIS — Z3201 Encounter for pregnancy test, result positive: Secondary | ICD-10-CM

## 2019-04-22 LAB — POCT URINE PREGNANCY: Preg Test, Ur: POSITIVE — AB

## 2019-04-22 NOTE — Progress Notes (Signed)
PRENATAL INTAKE SUMMARY  Kristen Chung presents today New OB Nurse Interview.  OB History    Gravida  9   Para  2   Term  1   Preterm  1   AB  4   Living  2     SAB  1   TAB  3   Ectopic  0   Multiple  0   Live Births  2          I have reviewed the patient's medical, obstetrical, social, and family histories, medications, and available lab results.  SUBJECTIVE She has no unusual complaints  OBJECTIVE Initial Physical Exam (New OB)  GENERAL APPEARANCE: alert, well appearing, crying, anxious.   ASSESSMENT Normal pregnancy  PLAN Patient is not sure if she will keep pregnancy, she recently took out a 50-B on the FOB and have no support system.

## 2019-05-20 ENCOUNTER — Encounter: Payer: Medicaid Other | Admitting: Obstetrics & Gynecology

## 2019-09-14 ENCOUNTER — Other Ambulatory Visit (HOSPITAL_COMMUNITY)
Admission: RE | Admit: 2019-09-14 | Discharge: 2019-09-14 | Disposition: A | Discharge status: Home / Self Care | Payer: Medicaid Other | Source: Ambulatory Visit | Attending: Nurse Practitioner | Admitting: Nurse Practitioner

## 2019-09-14 ENCOUNTER — Encounter: Payer: Self-pay | Admitting: Nurse Practitioner

## 2019-09-14 ENCOUNTER — Ambulatory Visit: Payer: Medicaid Other | Admitting: Nurse Practitioner

## 2019-09-14 ENCOUNTER — Other Ambulatory Visit: Payer: Self-pay

## 2019-09-14 VITALS — BP 151/99 | HR 78 | Wt 157.0 lb

## 2019-09-14 DIAGNOSIS — R102 Pelvic and perineal pain: Secondary | ICD-10-CM | POA: Insufficient documentation

## 2019-09-14 DIAGNOSIS — Z975 Presence of (intrauterine) contraceptive device: Secondary | ICD-10-CM

## 2019-09-14 DIAGNOSIS — N939 Abnormal uterine and vaginal bleeding, unspecified: Secondary | ICD-10-CM | POA: Insufficient documentation

## 2019-09-14 DIAGNOSIS — I10 Essential (primary) hypertension: Secondary | ICD-10-CM | POA: Diagnosis not present

## 2019-09-14 DIAGNOSIS — R030 Elevated blood-pressure reading, without diagnosis of hypertension: Secondary | ICD-10-CM | POA: Insufficient documentation

## 2019-09-14 LAB — POCT URINALYSIS DIPSTICK
Bilirubin, UA: NEGATIVE
Glucose, UA: NEGATIVE
Ketones, UA: NEGATIVE
Leukocytes, UA: NEGATIVE
Nitrite, UA: NEGATIVE
Protein, UA: NEGATIVE
Spec Grav, UA: 1.025 (ref 1.010–1.025)
Urobilinogen, UA: 0.2 E.U./dL
pH, UA: 6 (ref 5.0–8.0)

## 2019-09-14 NOTE — Progress Notes (Signed)
GYNECOLOGY OFFICE VISIT NOTE   History:  34 y.o. [id number] here today for abnormal vaginal bleeding since October 2020.  Client had a TAB and Nexplanon insertion on the same day.  She has continued to have spotting and some lower abdominal pain continuously since then.  Reports she wears 3 tampons daily.  Describes the pain in her lower abdomen bilaterally, her hips and her low back bilaterally.  She did not want a pregnancy with her previous partner but now has a current partner for the past 3 months who wants her to be pregnant so he can have a baby so she indicated she might want the Nexplanon removed soon.  Has a history of using many types of contraception.  Many years ago she used Depo and did have lots of bleeding with Depo.  Has not been back to her provider who performed the TAB.  Has been managing but today she comes for evaluation.  She denies any abnormal vaginal discharge, bleeding, pelvic pain or other concerns. Pap nto indicated today due to extended wearing of tampons.  Past Medical History:  Diagnosis Date  . Anxiety   . BPPV (benign paroxysmal positional vertigo)   . Chlamydia   . Crohn disease ([place])   . Depression    No specific treatment  . Esophagitis   . Gastritis   . Genital warts   . Gonorrhea   . Hearing loss   . Heart murmur   . Hiatal hernia   . Kidney stone   . PID (acute pelvic inflammatory disease)   . Pregnancy induced hypertension   . Preterm labor   . Urinary tract infection     Past Surgical History:  Procedure Laterality Date  . abortion     x2    The following portions of the patient's history were reviewed and updated as appropriate: allergies, current medications, past family history, past medical history, past social history, past surgical history and problem list.   Health Maintenance:  Normal pap  - reports [facility] in September at [facility].  Will request results.    Review of Systems:  Pertinent items noted in HPI and  remainder of comprehensive ROS otherwise negative.  Objective:  Physical Exam BP (!) 151/99   Pulse 78   Wt 157 lb (71.2 kg)   LMP 03/03/2019 (Exact Date) Comment: Nexplanon  Breastfeeding Unknown   BMI 27.81 kg/m  CONSTITUTIONAL: Well-developed, well-nourished female in no acute distress.  HENT:  Normocephalic, atraumatic. External right and left ear normal.  EYES: Conjunctivae and EOM are normal. Pupils are equal, round.  No scleral icterus.  NECK: Normal range of motion, supple, no masses SKIN: Skin is warm and dry. No rash noted. Not diaphoretic. No erythema. No pallor. NEUROLOGIC: Alert and oriented to person, place, and time. Normal muscle tone coordination. No cranial nerve deficit noted. PSYCHIATRIC: Normal mood and affect. Normal behavior. Normal judgment and thought content. CARDIOVASCULAR: Normal heart rate noted RESPIRATORY: Effort and breath sounds normal, no problems with respiration noted ABDOMEN: Soft, no distention noted.   PELVIC: Normal appearing external genitalia; normal appearing vaginal mucosa and cervix.  No abnormal discharge noted.  Normal uterine size, no other palpable masses, no uterine or adnexal tenderness. Small amount of dark blood in vagina.  No active bleeding seen.  One large swab cleared the blood. MUSCULOSKELETAL: Normal range of motion. No edema noted.  Labs and Imaging No results found.  Assessment & Plan:  1. Pelvic pain No CMT.  Mild chronic pain.  Advised to use ibuprofen if needed for pain  - POCT Urinalysis Dipstick  2. Abnormal vaginal bleeding Trying to distinguish whether vaginal bleeding she has been having is coming from incomplete TAB or is a side effect of the Nexplanon.  Labs pending.  - CBC - Beta hCG quant (ref lab)  3. Essential hypertension Follow up with PCP -  BP is high today and client had stopped her previous medication for hypertension without advice from PCP. Advised to have BP controlled with medication as  needed prior to having Nexplanon removed and considering conceiving.  Current level of BP today is not compatible with a healthy pregnancy.  4.  Nexplanon in place Nexplanon palpated in left upper, inner arm   Routine preventative health maintenance measures emphasized. Please refer to After Visit Summary for other counseling recommendations.   Return in about 3 months (around 12/12/2019) for evaluation of vaginal bleeding.   Total face-to-face time with patient: 15 minutes.  Over 50% of encounter was spent on counseling and coordination of care.  Earlie Server, RN, MSN, NP-BC Nurse Practitioner, [facility] for [employer], [facility] 09/14/2019 8:40 PM

## 2019-09-14 NOTE — Patient Instructions (Signed)
Follow up with your PCP about your blood pressure.  It is high today.  Will wait on your lab results to see what we need to do next.

## 2019-09-14 NOTE — Progress Notes (Signed)
RGYN patient presents for problem visit today.  CC: Vaginal Bleeding with Nexplanon.   Pt notes some days bleeding may  heavy and some days light   05/19/2019 Nexplanon was inserted  [facility] [place]  Pt states since 04/2019 she has not stopped bleeding, changes in mood, and pelvic pain   Pt consents to student in exam room

## 2019-09-15 LAB — CBC
Hematocrit: 37 % (ref 34.0–46.6)
Hemoglobin: 11.7 g/dL (ref 11.1–15.9)
MCH: 23.3 pg — ABNORMAL LOW (ref 26.6–33.0)
MCHC: 31.6 g/dL (ref 31.5–35.7)
MCV: 74 fL — ABNORMAL LOW (ref 79–97)
Platelets: 297 10*3/uL (ref 150–450)
RBC: 5.03 x10E6/uL (ref 3.77–5.28)
RDW: 16 % — ABNORMAL HIGH (ref 11.7–15.4)
WBC: 5.4 10*3/uL (ref 3.4–10.8)

## 2019-09-15 LAB — BETA HCG QUANT (REF LAB): hCG Quant: <1 m[IU]/mL

## 2019-09-15 MED ORDER — MEGESTROL ACETATE 40 MG PO TABS: ORAL_TABLET | ORAL | 1 refills | Status: DC

## 2019-09-15 NOTE — Addendum Note (Signed)
Addended by: Virginia Rochester on: 09/15/2019 08:25 AM   Modules accepted: Orders

## 2019-09-16 LAB — CERVICOVAGINAL ANCILLARY ONLY
Bacterial Vaginitis (gardnerella): POSITIVE — AB
Candida Glabrata: NEGATIVE
Candida Vaginitis: NEGATIVE
Chlamydia: NEGATIVE
Comment: NEGATIVE
Comment: NEGATIVE
Comment: NEGATIVE
Comment: NEGATIVE
Comment: NEGATIVE
Comment: NORMAL
Neisseria Gonorrhea: NEGATIVE
Trichomonas: NEGATIVE

## 2019-10-09 ENCOUNTER — Other Ambulatory Visit: Payer: Self-pay

## 2019-10-09 ENCOUNTER — Ambulatory Visit (INDEPENDENT_AMBULATORY_CARE_PROVIDER_SITE_OTHER): Payer: Medicaid Other | Admitting: Adult Health

## 2019-10-09 ENCOUNTER — Encounter: Payer: Self-pay | Admitting: Adult Health

## 2019-10-09 VITALS — BP 124/80 | HR 78 | Temp 97.3°F | Resp 16 | Ht 64.0 in | Wt 162.0 lb

## 2019-10-09 DIAGNOSIS — H8110 Benign paroxysmal vertigo, unspecified ear: Secondary | ICD-10-CM

## 2019-10-09 DIAGNOSIS — Z6828 Body mass index (BMI) 28.0-28.9, adult: Secondary | ICD-10-CM

## 2019-10-09 DIAGNOSIS — L236 Allergic contact dermatitis due to food in contact with the skin: Secondary | ICD-10-CM

## 2019-10-09 DIAGNOSIS — Z8619 Personal history of other infectious and parasitic diseases: Secondary | ICD-10-CM

## 2019-10-09 DIAGNOSIS — Z3009 Encounter for other general counseling and advice on contraception: Secondary | ICD-10-CM

## 2019-10-09 DIAGNOSIS — Z915 Personal history of self-harm: Secondary | ICD-10-CM

## 2019-10-09 DIAGNOSIS — G44221 Chronic tension-type headache, intractable: Secondary | ICD-10-CM

## 2019-10-09 DIAGNOSIS — Z8759 Personal history of other complications of pregnancy, childbirth and the puerperium: Secondary | ICD-10-CM | POA: Diagnosis not present

## 2019-10-09 DIAGNOSIS — R45851 Suicidal ideations: Secondary | ICD-10-CM

## 2019-10-09 DIAGNOSIS — F1290 Cannabis use, unspecified, uncomplicated: Secondary | ICD-10-CM

## 2019-10-09 DIAGNOSIS — Z789 Other specified health status: Secondary | ICD-10-CM

## 2019-10-09 DIAGNOSIS — Z13220 Encounter for screening for lipoid disorders: Secondary | ICD-10-CM

## 2019-10-09 DIAGNOSIS — N939 Abnormal uterine and vaginal bleeding, unspecified: Secondary | ICD-10-CM

## 2019-10-09 DIAGNOSIS — F1491 Cocaine use, unspecified, in remission: Secondary | ICD-10-CM

## 2019-10-09 DIAGNOSIS — Z87898 Personal history of other specified conditions: Secondary | ICD-10-CM

## 2019-10-09 DIAGNOSIS — Z0000 Encounter for general adult medical examination without abnormal findings: Secondary | ICD-10-CM | POA: Diagnosis not present

## 2019-10-09 DIAGNOSIS — Z8719 Personal history of other diseases of the digestive system: Secondary | ICD-10-CM | POA: Diagnosis not present

## 2019-10-09 DIAGNOSIS — T7840XA Allergy, unspecified, initial encounter: Secondary | ICD-10-CM

## 2019-10-09 DIAGNOSIS — T781XXA Other adverse food reactions, not elsewhere classified, initial encounter: Secondary | ICD-10-CM

## 2019-10-09 DIAGNOSIS — K50919 Crohn's disease, unspecified, with unspecified complications: Secondary | ICD-10-CM

## 2019-10-09 DIAGNOSIS — F1090 Alcohol use, unspecified, uncomplicated: Secondary | ICD-10-CM

## 2019-10-09 DIAGNOSIS — Z6829 Body mass index (BMI) 29.0-29.9, adult: Secondary | ICD-10-CM

## 2019-10-09 DIAGNOSIS — Z1329 Encounter for screening for other suspected endocrine disorder: Secondary | ICD-10-CM

## 2019-10-09 DIAGNOSIS — Z9151 Personal history of suicidal behavior: Secondary | ICD-10-CM

## 2019-10-09 DIAGNOSIS — Z7289 Other problems related to lifestyle: Secondary | ICD-10-CM

## 2019-10-09 DIAGNOSIS — R220 Localized swelling, mass and lump, head: Secondary | ICD-10-CM

## 2019-10-09 LAB — POCT URINE PREGNANCY: Preg Test, Ur: NEGATIVE

## 2019-10-09 LAB — POCT URINALYSIS DIPSTICK
Bilirubin, UA: NEGATIVE
Blood, UA: NEGATIVE
Glucose, UA: NEGATIVE
Ketones, UA: NEGATIVE
Leukocytes, UA: NEGATIVE
Nitrite, UA: NEGATIVE
Protein, UA: NEGATIVE
Spec Grav, UA: 1.02 (ref 1.010–1.025)
Urobilinogen, UA: 0.2 E.U./dL
pH, UA: 5 (ref 5.0–8.0)

## 2019-10-09 MED ORDER — EPINEPHRINE 0.3 MG/0.3ML IJ SOAJ: 0.3000 mg | INTRAMUSCULAR | 1 refills | Status: DC | PRN

## 2019-10-09 MED ORDER — PANTOPRAZOLE SODIUM 40 MG PO TBEC: 40.0000 mg | DELAYED_RELEASE_TABLET | Freq: Every day | ORAL | 0 refills | Status: DC

## 2019-10-09 NOTE — Patient Instructions (Addendum)
Abnormal Uterine Bleeding Abnormal uterine bleeding means bleeding more than usual from your uterus. It can include:  Bleeding between periods.  Bleeding after sex.  Bleeding that is heavier than normal.  Periods that last longer than usual.  Bleeding after you have stopped having your period (menopause). There are many problems that may cause this. You should see a doctor for any kind of bleeding that is not normal. Treatment depends on the cause of the bleeding. Follow these instructions at home:  Watch your condition for any changes.  Do not use tampons, douche, or have sex, if your doctor tells you not to.  Change your pads often.  Get regular well-woman exams. Make sure they include a pelvic exam and cervical cancer screening.  Keep all follow-up visits as told by your doctor. This is important. Contact a doctor if:  The bleeding lasts more than one week.  You feel dizzy at times.  You feel like you are going to throw up (nauseous).  You throw up. Get help right away if:  You pass out.  You have to change pads every hour.  You have belly (abdominal) pain.  You have a fever.  You get sweaty.  You get weak.  You passing large blood clots from your vagina. Summary  Abnormal uterine bleeding means bleeding more than usual from your uterus.  There are many problems that may cause this. You should see a doctor for any kind of bleeding that is not normal.  Treatment depends on the cause of the bleeding. This information is not intended to replace advice given to you by your health care provider. Make sure you discuss any questions you have with your health care provider. Document Revised: 07/03/2016 Document Reviewed: 07/03/2016 Elsevier Patient Education  [place].    Epinephrine injection (Auto-injector) What is this medicine? EPINEPHRINE (ep i NEF rin) is used for the emergency treatment of severe allergic reactions. You should keep this  medicine with you at all times. This medicine may be used for other purposes; ask your health care provider or pharmacist if you have questions. COMMON BRAND NAME(S): Adrenaclick, Auvi-Q, Epinephrine Professional EMS, epinephrinesnap, epinephrinesnap-v, EpiPen, EPIsnap Epinephrine, SYMJEPI, Twinject What should I tell my health care provider before I take this medicine? They need to know if you have any of the following conditions:  diabetes  heart disease  high blood pressure  lung or breathing disease, like asthma  Parkinson's disease  thyroid disease  an unusual or allergic reaction to epinephrine, sulfites, other medicines, foods, dyes, or preservatives  pregnant or trying to get pregnant  breast-feeding How should I use this medicine? This medicine is for injection into the outer thigh. Your doctor or health care professional will instruct you on the proper use of the device during an emergency. Read all directions carefully and make sure you understand them. Do not use more often than directed. Talk to your pediatrician regarding the use of this medicine in children. Special care may be needed. This drug is commonly used in children. A special device is available for use in children. If you are giving this medicine to a young child, hold their leg firmly in place before and during the injection to prevent injury. Overdosage: If you think you have taken too much of this medicine contact a poison control center or emergency room at once. NOTE: This medicine is only for you. Do not share this medicine with others. What if I miss a dose? This does not  apply. You should only use this medicine for an allergic reaction. What may interact with this medicine? This medicine is only used during an emergency. Significant drug interactions are not likely during emergency use. This list may not describe all possible interactions. Give your health care provider a list of all the medicines,  herbs, non-prescription drugs, or dietary supplements you use. Also tell them if you smoke, drink alcohol, or use illegal drugs. Some items may interact with your medicine. What should I watch for while using this medicine? Keep this medicine ready for use in the case of a severe allergic reaction. Make sure that you have the phone number of your doctor or health care professional and local hospital ready. Remember to check the expiration date of your medicine regularly. You may need to have additional units of this medicine with you at work, school, or other places. Talk to your doctor or health care professional about your need for extra units. Some emergencies may require an additional dose. Check with your doctor or a health care professional before using an extra dose. After use, go to the nearest hospital or call 911. Avoid physical activity. Make sure the treating health care professional knows you have received an injection of this medicine. You will receive additional instructions on what to do during and after use of this medicine before a medical emergency occurs. What side effects may I notice from receiving this medicine? Side effects that you should report to your doctor or health care professional as soon as possible:  allergic reactions like skin rash, itching or hives, swelling of the face, lips, or tongue  breathing problems  chest pain  fast, irregular heartbeat  pain, tingling, numbness in the hands or feet  pain, redness, or irritation at site where injected  vomiting Side effects that usually do not require medical attention (report to your doctor or health care professional if they continue or are bothersome):  anxious  dizziness  dry mouth  headache  increased sweating  nausea  unusually weak or tired This list may not describe all possible side effects. Call your doctor for medical advice about side effects. You may report side effects to FDA at  1-800-FDA-1088. Where should I keep my medicine? Keep out of the reach of children. Store at room temperature between 15 and 30 degrees C (59 and 86 degrees F). Protect from light and heat. The solution should be clear in color. If the solution is discolored or contains particles it must be replaced. Throw away any unused medicine after the expiration date. Ask your doctor or pharmacist about proper disposal of the injector if it is expired or has been used. Always replace your auto-injector before it expires. NOTE: This sheet is a summary. It may not cover all possible information. If you have questions about this medicine, talk to your doctor, pharmacist, or health care provider.  2020 Elsevier/Gold Standard (2014-12-13 12:24:50)   Fat and Cholesterol Restricted Eating Plan Getting too much fat and cholesterol in your diet may cause health problems. Choosing the right foods helps keep your fat and cholesterol at normal levels. This can keep you from getting certain diseases. Your doctor may recommend an eating plan that includes:  Total fat: ______% or less of total calories a day.  Saturated fat: ______% or less of total calories a day.  Cholesterol: less than _________mg a day.  Fiber: ______g a day. What are tips for following this plan? Meal planning  At meals, divide your  plate into four equal parts: ? Fill one-half of your plate with vegetables and green salads. ? Fill one-fourth of your plate with whole grains. ? Fill one-fourth of your plate with low-fat (lean) protein foods.  Eat fish that is high in omega-3 fats at least two times a week. This includes mackerel, tuna, sardines, and salmon.  Eat foods that are high in fiber, such as whole grains, beans, apples, broccoli, carrots, peas, and barley. General tips   Work with your doctor to lose weight if you need to.  Avoid: ? Foods with added sugar. ? Fried foods. ? Foods with partially hydrogenated oils.  Limit  alcohol intake to no more than 1 drink a day for nonpregnant women and 2 drinks a day for men. One drink equals 12 oz of beer, 5 oz of wine, or 1 oz of hard liquor. Reading food labels  Check food labels for: ? Trans fats. ? Partially hydrogenated oils. ? Saturated fat (g) in each serving. ? Cholesterol (mg) in each serving. ? Fiber (g) in each serving.  Choose foods with healthy fats, such as: ? Monounsaturated fats. ? Polyunsaturated fats. ? Omega-3 fats.  Choose grain products that have whole grains. Look for the word "whole" as the first word in the ingredient list. Cooking  Cook foods using low-fat methods. These include baking, boiling, grilling, and broiling.  Eat more home-cooked foods. Eat at restaurants and buffets less often.  Avoid cooking using saturated fats, such as butter, cream, palm oil, palm kernel oil, and coconut oil. Recommended foods  Fruits  All fresh, canned (in natural juice), or frozen fruits. Vegetables  Fresh or frozen vegetables (raw, steamed, roasted, or grilled). Green salads. Grains  Whole grains, such as whole wheat or whole grain breads, crackers, cereals, and pasta. Unsweetened oatmeal, bulgur, barley, quinoa, or brown rice. Corn or whole wheat flour tortillas. Meats and other protein foods  Ground beef (85% or leaner), grass-fed beef, or beef trimmed of fat. Skinless chicken or [place]. Ground chicken or [place]. Pork trimmed of fat. All fish and seafood. Egg whites. Dried beans, peas, or lentils. Unsalted nuts or seeds. Unsalted canned beans. Nut butters without added sugar or oil. Dairy  Low-fat or nonfat dairy products, such as skim or 1% milk, 2% or reduced-fat cheeses, low-fat and fat-free ricotta or cottage cheese, or plain low-fat and nonfat yogurt. Fats and oils  Tub margarine without trans fats. Light or reduced-fat mayonnaise and salad dressings. Avocado. Olive, canola, sesame, or safflower oils. The items listed above may not  be a complete list of foods and beverages you can eat. Contact a dietitian for more information. Foods to avoid Fruits  Canned fruit in heavy syrup. Fruit in cream or butter sauce. Fried fruit. Vegetables  Vegetables cooked in cheese, cream, or butter sauce. Fried vegetables. Grains  White bread. White pasta. White rice. Cornbread. Bagels, pastries, and croissants. Crackers and snack foods that contain trans fat and hydrogenated oils. Meats and other protein foods  Fatty cuts of meat. Ribs, chicken wings, bacon, sausage, bologna, salami, chitterlings, fatback, hot dogs, bratwurst, and packaged lunch meats. Liver and organ meats. Whole eggs and egg yolks. Chicken and [place] with skin. Fried meat. Dairy  Whole or 2% milk, cream, half-and-half, and cream cheese. Whole milk cheeses. Whole-fat or sweetened yogurt. Full-fat cheeses. Nondairy creamers and whipped toppings. Processed cheese, cheese spreads, and cheese curds. Beverages  Alcohol. Sugar-sweetened drinks such as sodas, lemonade, and fruit drinks. Fats and oils  Butter, stick margarine,  lard, shortening, ghee, or bacon fat. Coconut, palm kernel, and palm oils. Sweets and desserts  Corn syrup, sugars, honey, and molasses. Candy. Jam and jelly. Syrup. Sweetened cereals. Cookies, pies, cakes, donuts, muffins, and ice cream. The items listed above may not be a complete list of foods and beverages you should avoid. Contact a dietitian for more information. Summary  Choosing the right foods helps keep your fat and cholesterol at normal levels. This can keep you from getting certain diseases.  At meals, fill one-half of your plate with vegetables and green salads.  Eat high-fiber foods, like whole grains, beans, apples, carrots, peas, and barley.  Limit added sugar, saturated fats, alcohol, and fried foods. This information is not intended to replace advice given to you by your health care provider. Make sure you discuss any  questions you have with your health care provider. Document Revised: 03/12/2018 Document Reviewed: 03/26/2017 Elsevier Patient Education  [place]:  If Emergency please seek Emergency Room Care Immediately or Call 911.   [facility] Address:  [place], [place] [address] Phone: [phone] Website : [email or web address]   [place] Address:  [address] [place], [place] [address] Phone: [phone] Fax: [phone] Website: [email or web address] How To Access Our Services Because our main goal is to meet the needs of our consumers, [facility] operates on a walk-in basis! To access services, there are just 3 easy steps: 1) Walk in any Monday, Wednesday or Friday between 8:00 am and 3:00 pm and complete our consumer paperwork 2) A Comprehensive Clinical Assessment (CCA) will be completed and appropriate service recommendations will be provided 3) Recommendations are sent to [facility] team members and the appropriate staff will call you within days. Advanced Access Open M - F, 8:00 am - 8:00 pm  Mental health crisis services for all age groups  Triage  Psychiatric Evaluations  Involuntary Commitments  [facility]  Address: [address], [place], [place] [address] Website : [email or web address] Walk in's accepted see web site or call for more information Phone : [phone] Also has [facility] [phone]    Psychology Today Find a therapist by searching online in your area or specialist by your diagnosis Website:  [email or web address]   Health Maintenance, Female Adopting a healthy lifestyle and getting preventive care are important in promoting health and wellness. Ask your health care provider about:  The right schedule for you to have regular tests and  exams.  Things you can do on your own to prevent diseases and keep yourself healthy. What should I know about diet, weight, and exercise? Eat a healthy diet   Eat a diet that includes plenty of vegetables, fruits, low-fat dairy products, and lean protein.  Do not eat a lot of foods that are high in solid fats, added sugars, or sodium. Maintain a healthy weight Body mass index (BMI) is used to identify weight problems. It estimates body fat based on height and weight. Your health care provider can help determine your BMI and help you achieve or maintain a healthy weight. Get regular exercise Get regular exercise. This is one of the most important things you can do for your health. Most adults should:  Exercise for at least 150 minutes each week. The exercise should increase your heart rate and make you sweat (moderate-intensity exercise).  Do strengthening exercises at least twice a week. This is in addition to the moderate-intensity exercise.  Spend less time sitting. Even light physical activity can be beneficial. Watch cholesterol and blood lipids Have your blood tested for lipids and cholesterol at 33 years of age, then have this test every 5 years. Have your cholesterol levels checked more often if:  Your lipid or cholesterol levels are high.  You are older than 33 years of age.  You are at high risk for heart disease. What should I know about cancer screening? Depending on your health history and family history, you may need to have cancer screening at various ages. This may include screening for:  Breast cancer.  Cervical cancer.  Colorectal cancer.  Skin cancer.  Lung cancer. What should I know about heart disease, diabetes, and high blood pressure? Blood pressure and heart disease  High blood pressure causes heart disease and increases the risk of stroke. This is more likely to develop in people who have high blood pressure readings, are of African descent, or are  overweight.  Have your blood pressure checked: ? Every 3-5 years if you are 31-76 years of age. ? Every year if you are 74 years old or older. Diabetes Have regular diabetes screenings. This checks your fasting blood sugar level. Have the screening done:  Once every three years after age 57 if you are at a normal weight and have a low risk for diabetes.  More often and at a younger age if you are overweight or have a high risk for diabetes. What should I know about preventing infection? Hepatitis B If you have a higher risk for hepatitis B, you should be screened for this virus. Talk with your health care provider to find out if you are at risk for hepatitis B infection. Hepatitis C Testing is recommended for:  Everyone born from [birth year] through 1964.  Anyone with known risk factors for hepatitis C. Sexually transmitted infections (STIs)  Get screened for STIs, including gonorrhea and chlamydia, if: ? You are sexually active and are younger than 33 years of age. ? You are older than 33 years of age and your health care provider tells you that you are at risk for this type of infection. ? Your sexual activity has changed since you were last screened, and you are at increased risk for chlamydia or gonorrhea. Ask your health care provider if you are at risk.  Ask your health care provider about whether you are at high risk for HIV. Your health care provider may recommend a prescription medicine to help prevent HIV infection. If you choose to take medicine to prevent HIV, you should first get tested for HIV. You should then be tested every 3 months for as long as you are taking the medicine. Pregnancy  If you are about to stop having your period (premenopausal) and you may become pregnant, seek counseling before you get pregnant.  Take 400 to 800 micrograms (mcg) of folic acid every day if you become pregnant.  Ask for birth control (contraception) if you want to prevent  pregnancy. Osteoporosis and menopause Osteoporosis is a disease in which the bones lose minerals and strength with aging. This can result in bone fractures. If you are 41 years old or older, or if you are at risk for osteoporosis and fractures, ask your health care provider if you should:  Be screened for bone loss.  Take a calcium or vitamin D supplement to lower your risk of fractures.  Be given hormone replacement therapy (HRT) to treat symptoms of menopause. Follow these instructions at  home: Lifestyle  Do not use any products that contain nicotine or tobacco, such as cigarettes, e-cigarettes, and chewing tobacco. If you need help quitting, ask your health care provider.  Do not use street drugs.  Do not share needles.  Ask your health care provider for help if you need support or information about quitting drugs. Alcohol use  Do not drink alcohol if: ? Your health care provider tells you not to drink. ? You are pregnant, may be pregnant, or are planning to become pregnant.  If you drink alcohol: ? Limit how much you use to 0-1 drink a day. ? Limit intake if you are breastfeeding.  Be aware of how much alcohol is in your drink. In the [place] one drink equals one 12 oz bottle of beer (355 mL), one 5 oz glass of wine (148 mL), or one 1 oz glass of hard liquor (44 mL). General instructions  Schedule regular health, dental, and eye exams.  Stay current with your vaccines.  Tell your health care provider if: ? You often feel depressed. ? You have ever been abused or do not feel safe at home. Summary  Adopting a healthy lifestyle and getting preventive care are important in promoting health and wellness.  Follow your health care provider's instructions about healthy diet, exercising, and getting tested or screened for diseases.  Follow your health care provider's instructions on monitoring your cholesterol and blood pressure. This information is not intended to replace  advice given to you by your health care provider. Make sure you discuss any questions you have with your health care provider. Document Revised: 07/02/2018 Document Reviewed: 07/02/2018 Elsevier Patient Education  [place].  Suicidal Feelings: How to Help Yourself Suicide is when you end your own life. There are many things you can do to help yourself feel better when struggling with these feelings. Many services and people are available to support you and others who struggle with similar feelings.  If you ever feel like you may hurt yourself or others, or have thoughts about taking your own life, get help right away. To get help:  Call your local emergency services (911 in the U.S.).  [facility] [place] [address]'s health and human services helpline (211 in the [place].S[place]  Go to your nearest emergency department.  Call a suicide hotline to speak with a trained counselor. The following suicide hotlines are available in the [place]: ? 1-800-273-TALK 936-747-0968). ? 1-800-SUICIDE 928-436-1702). ? (612)818-7659. This is a hotline for Spanish speakers. ? 575-553-6889. This is a hotline for TTY users. ? 1-866-4-U-TREVOR (580)212-5322). This is a hotline for lesbian, gay, bisexual, transgender, or questioning youth. ? For a list of hotlines in [place], visit ParkingAffiliatePrograms.se.html  Contact a crisis center or a local suicide prevention center. To find a crisis center or suicide prevention center: ? Call your local hospital, clinic, community service organization, mental health center, social service provider, or health department. Ask for help with connecting to a crisis center. ? For a list of crisis centers in the [place], visit: suicidepreventionlifeline.org ? For a list of crisis centers in [place], visit: suicideprevention.ca How to help yourself feel better   Promise yourself that you will not do anything extreme when you have  suicidal feelings. Remember, there is hope. Many people have gotten through suicidal thoughts and feelings, and you can too. If you have had these feelings before, remind yourself that you can get through them again.  Let family, friends, teachers, or counselors know how you  are feeling. Try not to separate yourself from those who care about you and want to help you. Talk with someone every day, even if you do not feel sociable. Face-to-face conversation is best to help them understand your feelings.  Contact a mental health care provider and work with this person regularly.  Make a safety plan that you can follow during a crisis. Include phone numbers of suicide prevention hotlines, mental health professionals, and trusted friends and family members you can call during an emergency. Save these numbers on your phone.  If you are thinking of taking a lot of medicine, give your medicine to someone who can give it to you as prescribed. If you are on antidepressants and are concerned you will overdose, tell your health care provider so that he or she can give you safer medicines.  Try to stick to your routines. Follow a schedule every day. Make self-care a priority.  Make a list of realistic goals, and cross them off when you achieve them. Accomplishments can give you a sense of worth.  Wait until you are feeling better before doing things that you find difficult or unpleasant.  Do things that you have always enjoyed to take your mind off your feelings. Try reading a book, or listening to or playing music. Spending time outside, in nature, may help you feel better. Follow these instructions at home:   Visit your primary health care provider every year for a checkup.  Work with a mental health care provider as needed.  Eat a well-balanced diet, and eat regular meals.  Get plenty of rest.  Exercise if you are able. Just 30 minutes of exercise each day can help you feel better.  Take  over-the-counter and prescription medicines only as told by your health care provider. Ask your mental health care provider about the possible side effects of any medicines you are taking.  Do not use alcohol or drugs, and remove these substances from your home.  Remove weapons, poisons, knives, and other deadly items from your home. General recommendations  Keep your living space well lit.  When you are feeling well, write yourself a letter with tips and support that you can read when you are not feeling well.  Remember that life's difficulties can be sorted out with help. Conditions can be treated, and you can learn behaviors and ways of thinking that will help you. Where to find more information  National Suicide Prevention Lifeline: www.suicidepreventionlifeline.org  Hopeline: www.hopeline.[place] for Suicide Prevention: PromotionalLoans.co.za  [employer] (for lesbian, gay, bisexual, transgender, or questioning youth): www.thetrevorproject.org Contact a health care provider if:  You feel as though you are a burden to others.  You feel agitated, angry, vengeful, or have extreme mood swings.  You have withdrawn from family and friends. Get help right away if:  You are talking about suicide or wishing to die.  You start making plans for how to commit suicide.  You feel that you have no reason to live.  You start making plans for putting your affairs in order, saying goodbye, or giving your possessions away.  You feel guilt, shame, or unbearable pain, and it seems like there is no way out.  You are frequently using drugs or alcohol.  You are engaging in risky behaviors that could lead to death. If you have any of these symptoms, get help right away. Call emergency services, go to your nearest emergency department or crisis center, or call a suicide crisis  helpline. Summary  Suicide is when you take your own life.  Promise yourself that you will not do  anything extreme when you have suicidal feelings.  Let family, friends, teachers, or counselors know how you are feeling.  Get help right away if you feel as though life is getting too tough to handle and you are thinking about suicide. This information is not intended to replace advice given to you by your health care provider. Make sure you discuss any questions you have with your health care provider. Document Revised: 10/30/2018 Document Reviewed: 02/19/2017 Elsevier Patient Education  [place] and Cholesterol Restricted Eating Plan Getting too much fat and cholesterol in your diet may cause health problems. Choosing the right foods helps keep your fat and cholesterol at normal levels. This can keep you from getting certain diseases. Your doctor may recommend an eating plan that includes:  Total fat: ______% or less of total calories a day.  Saturated fat: ______% or less of total calories a day.  Cholesterol: less than _________mg a day.  Fiber: ______g a day. What are tips for following this plan? Meal planning  At meals, divide your plate into four equal parts: ? Fill one-half of your plate with vegetables and green salads. ? Fill one-fourth of your plate with whole grains. ? Fill one-fourth of your plate with low-fat (lean) protein foods.  Eat fish that is high in omega-3 fats at least two times a week. This includes mackerel, tuna, sardines, and salmon.  Eat foods that are high in fiber, such as whole grains, beans, apples, broccoli, carrots, peas, and barley. General tips   Work with your doctor to lose weight if you need to.  Avoid: ? Foods with added sugar. ? Fried foods. ? Foods with partially hydrogenated oils.  Limit alcohol intake to no more than 1 drink a day for nonpregnant women and 2 drinks a day for men. One drink equals 12 oz of beer, 5 oz of wine, or 1 oz of hard liquor. Reading food labels  Check food labels for: ? Trans  fats. ? Partially hydrogenated oils. ? Saturated fat (g) in each serving. ? Cholesterol (mg) in each serving. ? Fiber (g) in each serving.  Choose foods with healthy fats, such as: ? Monounsaturated fats. ? Polyunsaturated fats. ? Omega-3 fats.  Choose grain products that have whole grains. Look for the word "whole" as the first word in the ingredient list. Cooking  Cook foods using low-fat methods. These include baking, boiling, grilling, and broiling.  Eat more home-cooked foods. Eat at restaurants and buffets less often.  Avoid cooking using saturated fats, such as butter, cream, palm oil, palm kernel oil, and coconut oil. Recommended foods  Fruits  All fresh, canned (in natural juice), or frozen fruits. Vegetables  Fresh or frozen vegetables (raw, steamed, roasted, or grilled). Green salads. Grains  Whole grains, such as whole wheat or whole grain breads, crackers, cereals, and pasta. Unsweetened oatmeal, bulgur, barley, quinoa, or brown rice. Corn or whole wheat flour tortillas. Meats and other protein foods  Ground beef (85% or leaner), grass-fed beef, or beef trimmed of fat. Skinless chicken or [place]. Ground chicken or [place]. Pork trimmed of fat. All fish and seafood. Egg whites. Dried beans, peas, or lentils. Unsalted nuts or seeds. Unsalted canned beans. Nut butters without added sugar or oil. Dairy  Low-fat or nonfat dairy products, such as skim or 1% milk, 2% or reduced-fat cheeses, low-fat  and fat-free ricotta or cottage cheese, or plain low-fat and nonfat yogurt. Fats and oils  Tub margarine without trans fats. Light or reduced-fat mayonnaise and salad dressings. Avocado. Olive, canola, sesame, or safflower oils. The items listed above may not be a complete list of foods and beverages you can eat. Contact a dietitian for more information. Foods to avoid Fruits  Canned fruit in heavy syrup. Fruit in cream or butter sauce. Fried fruit. Vegetables  Vegetables  cooked in cheese, cream, or butter sauce. Fried vegetables. Grains  White bread. White pasta. White rice. Cornbread. Bagels, pastries, and croissants. Crackers and snack foods that contain trans fat and hydrogenated oils. Meats and other protein foods  Fatty cuts of meat. Ribs, chicken wings, bacon, sausage, bologna, salami, chitterlings, fatback, hot dogs, bratwurst, and packaged lunch meats. Liver and organ meats. Whole eggs and egg yolks. Chicken and [place] with skin. Fried meat. Dairy  Whole or 2% milk, cream, half-and-half, and cream cheese. Whole milk cheeses. Whole-fat or sweetened yogurt. Full-fat cheeses. Nondairy creamers and whipped toppings. Processed cheese, cheese spreads, and cheese curds. Beverages  Alcohol. Sugar-sweetened drinks such as sodas, lemonade, and fruit drinks. Fats and oils  Butter, stick margarine, lard, shortening, ghee, or bacon fat. Coconut, palm kernel, and palm oils. Sweets and desserts  Corn syrup, sugars, honey, and molasses. Candy. Jam and jelly. Syrup. Sweetened cereals. Cookies, pies, cakes, donuts, muffins, and ice cream. The items listed above may not be a complete list of foods and beverages you should avoid. Contact a dietitian for more information. Summary  Choosing the right foods helps keep your fat and cholesterol at normal levels. This can keep you from getting certain diseases.  At meals, fill one-half of your plate with vegetables and green salads.  Eat high-fiber foods, like whole grains, beans, apples, carrots, peas, and barley.  Limit added sugar, saturated fats, alcohol, and fried foods. This information is not intended to replace advice given to you by your health care provider. Make sure you discuss any questions you have with your health care provider. Document Revised: 03/12/2018 Document Reviewed: 03/26/2017 Elsevier Patient Education  [place].

## 2019-10-09 NOTE — Progress Notes (Signed)
Patient: Kristen Chung, Female    DOB: 02-20-1986, 34 y.o.   MRN: [id number] Visit Date: 10/09/2019  Today's Provider: [name], FNP   Chief Complaint  Patient presents with  . New Patient (Initial Visit)   Subjective:    New Patient Kristen Chung is a 34 y.o. female who presents today for health maintenance and establish care as a new patient, patient is a former patient of Paediatric nurse.   She feels poorly, patient would like to address today symptoms of depression, patient reports crying episodes and states that she cant process things well, patient reports that she has difficulty getting up most days and has had suicidal thoughts in past but no plan.   Patient request referral today for G.I to follow up with Crohn's  Disease and she continues to have cramping and pain. She has mostly constipation. She has alternating diarrhea. She denies any rectal pain or pressure. She has no rectal bleeding or dark stools. Denies rectal pain. Occasional cramping in stomach. Denies any abdominal pain.    states that she would like a referral to allergist for possible food allergies, patient states that when she eats out she has to take benadryl because of reaction to food. She reports with certain foods she has had hand and face swelling and has to take benadryl due to breaking out in rash. Strawberries, celery. Roof of mouth burns and itches at times, lips swell and eyes swell.   Patient would also like to address irregular bleeding, patient states that she had an abortion 05/18/20 and has been bleeding since, patient states that she has seen a OB since abortion and was placed on a hormone pill that she reports she started a week ago. She saw an OB in [place]. [facility]. She did not have an ultrasound and was given a nexplanon  G5P2 - abortion x 3. She has 52 and 33 year old she is considering another baby now she says as she is in a new relationship for a year.    She has history of genital warts. She denies any outbreaks currently. Denies any abnormal discharge. She is having light pink bleeding now with injectable  She would like to go to OBGYN locally. She has Nexplanon.     She reports she is not  exercising . She reports she is sleeping poorly, patient reports that she has difficulty staying asleep and reports states " it feels like someone is holding me down, like there is pressure on my chest".   Suicidal thoughts occasionally, has  no intents. She has a history of suicide attempts tried to overdose on cocaine,and tried to drive her car off a bridge. in past. She has no intent. She denies any homicidal thought so intents. She cries often. She feels distant.  Her and her new partner are starting couple counseling in past.  Denies homicidal ideations or intents now or in the past.  She has two grown children, she also is wanting to consider pregnancy with her new partner as she reports she has kids but he does not.   She has not used cocaine in last 2 weeks. 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 Alcohol some, now mostly social. She is not drinking everyday.  She feels like she is  under control now. Occasionally uses marijuana. Sometimes uses so she can eat. It makes her happy feeling.  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 She has good days, she has dizziness. She did PT that she reports did not help.She has headaches daily frontal that usually do not resolve with over the counter pain relievers. Described headaches as pressure in frontal lobe. Denies any nasal symptoms or history of significantly allergies. No repeat sinusitis issues. Denies post nasal drip. Has occasional sneezing.  Quick head movements worsen. She gets nauseated with this. She has spells that are worse than the others. Hot showers makes it worse.  Sitting or standing too fast does sometimes cause issues.   She has some heartburn.She has some acid some times with burning with acid taste.   Patient  denies any fever, body aches,chills, rash, chest pain, shortness of breath, nausea, vomiting, or diarrhea.   -----------------------------------------------------------------   Review of Systems  Constitutional: Positive for diaphoresis. Negative for activity change, appetite change, chills, fatigue, fever and unexpected weight change.  HENT: Positive for facial swelling, sneezing and tinnitus. Negative for congestion, dental problem, drooling, ear discharge, ear pain, hearing loss, mouth sores, nosebleeds, postnasal drip, rhinorrhea, sinus pressure, sinus pain, sore throat, trouble swallowing and voice change.   Eyes: Negative.   Respiratory: Positive for chest tightness (with allergy reaction ). Negative for apnea, cough, choking, shortness of breath, wheezing and stridor.   Cardiovascular: Negative for chest pain, palpitations and leg swelling.  Gastrointestinal: Positive for abdominal distention, constipation and nausea. Negative for abdominal pain, anal bleeding, blood in stool, diarrhea, rectal pain and vomiting.  Endocrine: Positive for heat intolerance, polyphagia and polyuria. Negative for cold intolerance and polydipsia.  Genitourinary: Positive for frequency. Negative for decreased urine volume, difficulty urinating, dyspareunia, dysuria, enuresis, flank pain, genital sores, hematuria, menstrual problem, pelvic pain, urgency, vaginal bleeding, vaginal discharge and vaginal pain.  Musculoskeletal: Negative.   Skin: Positive for rash.  Allergic/Immunologic: Positive for food allergies. Negative for environmental allergies and immunocompromised state.  Neurological: Positive for dizziness and headaches. Negative for tremors, seizures, syncope, facial asymmetry, speech difficulty, weakness, light-headedness and numbness.  Hematological:  Negative.   Psychiatric/Behavioral: Positive for sleep disturbance and suicidal ideas. Negative for agitation, behavioral problems, confusion, decreased concentration, dysphoric mood, hallucinations and self-injury. The patient is nervous/anxious. The patient is not hyperactive.   All other systems reviewed and are negative.   Social History She  reports that she has been smoking cigarettes. She has a 1.50 pack-year smoking history. She has never used smokeless tobacco. She reports previous alcohol use. She reports previous drug use. Drugs: Cocaine and Marijuana. Social History   Socioeconomic History  . Marital status: Single    Spouse name: Not on file  . Number of children: 2  . Years of education: Not on file  . Highest education level: Not on file  Occupational History  . Not on file  Tobacco Use  . Smoking status: Current Some Day Smoker    Packs/day: 0.25    Years: 6.00    Pack years: 1.50    Types: Cigarettes  . Smokeless tobacco: Never Used  Substance and Sexual Activity  . Alcohol use: Not Currently    Comment: occ-last use last week 09/16/18  . Drug use: Not Currently    Types: Cocaine, Marijuana    Comment: cocaine use last week  . Sexual activity: Yes    Birth control/protection: None  Other Topics Concern  . Not on file  Social  History Narrative   Lives two children. Recently took out 50-B on the FOB.   Social Determinants of Health   Financial Resource Strain: Low Risk   . Difficulty of Paying Living Expenses: Not hard at all  Food Insecurity: Unknown  . Worried About Charity fundraiser in the Last Year: Not on file  . Ran Out of Food in the Last Year: Never true  Transportation Needs: No Transportation Needs  . Lack of Transportation (Medical): No  . Lack of Transportation (Non-Medical): No  Physical Activity:   . Days of Exercise per Week:   . Minutes of Exercise per Session:   Stress: Stress Concern Present  . Feeling of Stress : Very much  Social  Connections: Unknown  . Frequency of Communication with Friends and Family: Twice a week  . Frequency of Social Gatherings with Friends and Family: Once a week  . Attends Religious Services: Not on file  . Active Member of Clubs or Organizations: Not on file  . Attends Archivist Meetings: Not on file  . Marital Status: Not on file    Patient Active Problem List   Diagnosis Date Noted  . Elevated BP without diagnosis of hypertension 09/14/2019  . Shortness of breath 08/28/2018  . Sensorineural hearing loss (SNHL) of both ears 05/05/2018  . Tinnitus of right ear 05/05/2018  . Breast pain in female 05/13/2013  . Crohn disease ([place]) 05/13/2013  . Cocaine substance abuse ([place]) 05/13/2013  . Tobacco abuse counseling 05/13/2013  . Contraception management 05/13/2013  . Difficulty swallowing 05/13/2013  . 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 abortion     x2    Family History  Family Status  Relation Name Status  . Mother  (Not Specified)  . Father  (Not Specified)  . Sister  (Not Specified)  . Other Grandparents (Not Specified)  . Neg Hx  (Not Specified)   Her family history includes Asthma in her mother and sister; Cancer in her father; Depression in her sister; Diabetes in her mother, sister, and another family member; Heart disease in her mother; Hypertension in her mother and sister.     Allergies  Allergen Reactions  . Celery Oil Itching  . Codone [Hydrocodone] Itching  . Dilaudid [Hydromorphone Hcl] Itching  . Hydromorphone Hcl Itching  . Strawberry Extract Itching  . Tramadol Itching    Previous Medications   DIPHENHYDRAMINE (BENADRYL) 25 MG CAPSULE    Take 1 capsule (25 mg total) by mouth every 6 (six) hours as needed.   EPINEPHRINE 0.3 MG/0.3 ML IJ SOAJ INJECTION    Inject 0.3 mLs (0.3 mg total) into the muscle once.   MECLIZINE HCL 25 MG CHEW    Chew by mouth.   MECLIZINE HCL PO     Take by mouth.   MEGESTROL (MEGACE) 40 MG TABLET    Take one tablet by mouth twice a day until the bleeding stops.  If the bleeding has stopped, then go to once a day for 2 days and then stop taking the medication to taper it off.  If you start bleeding again, you can begin the cycle again.   OMEPRAZOLE (PRILOSEC) 10 MG CAPSULE    Take by mouth.    Patient Care Team: Flinchum, Kelby Aline, FNP as PCP - General (Family Medicine)      Objective:   Today's Vitals   10/09/19 1413  BP: 124/80  Pulse:  78  Resp: 16  Temp: (!) 97.3 F (36.3 C)  TempSrc: Temporal  SpO2: 98%  Weight: 162 lb (73.5 kg)  Height: 5' 4"  (1.626 m)   Body mass index is 27.81 kg/m.   Body mass index is 27.81 kg/m.   Physical Exam Vitals reviewed.  Constitutional:      General: She is not in acute distress.    Appearance: She is well-developed. She is not diaphoretic.     Interventions: She is not intubated. HENT:     Head: Normocephalic and atraumatic.     Right Ear: External ear normal.     Left Ear: External ear normal.     Nose: Nose normal.     Mouth/Throat:     Pharynx: No oropharyngeal exudate.  Eyes:     General: Lids are normal. No scleral icterus.       Right eye: No discharge.        Left eye: No discharge.     Conjunctiva/sclera: Conjunctivae normal.     Right eye: Right conjunctiva is not injected. No exudate or hemorrhage.    Left eye: Left conjunctiva is not injected. No exudate or hemorrhage.    Pupils: Pupils are equal, round, and reactive to light.  Neck:     Thyroid: No thyroid mass or thyromegaly.     Vascular: Normal carotid pulses. No carotid bruit, hepatojugular reflux or JVD.     Trachea: Trachea and phonation normal. No tracheal tenderness or tracheal deviation.     Meningeal: Brudzinski's sign and Kernig's sign absent.  Cardiovascular:     Rate and Rhythm: Normal rate and regular rhythm.     Pulses: Normal pulses.          Radial pulses are 2+ on the right side and  2+ on the left side.       Dorsalis pedis pulses are 2+ on the right side and 2+ on the left side.       Posterior tibial pulses are 2+ on the right side and 2+ on the left side.     Heart sounds: Normal heart sounds, S1 normal and S2 normal. Heart sounds not distant. No murmur. No friction rub. No gallop.   Pulmonary:     Effort: Pulmonary effort is normal. No tachypnea, bradypnea, accessory muscle usage or respiratory distress. She is not intubated.     Breath sounds: Normal breath sounds. No stridor. No wheezing or rales.  Chest:     Chest wall: No tenderness.  Abdominal:     General: Bowel sounds are normal. There is no distension or abdominal bruit.     Palpations: Abdomen is soft. There is no shifting dullness, fluid wave, hepatomegaly, splenomegaly, mass or pulsatile mass.     Tenderness: There is no abdominal tenderness. There is no guarding or rebound.     Hernia: No hernia is present.  Musculoskeletal:        General: No tenderness or deformity. Normal range of motion.     Cervical back: Full passive range of motion without pain, normal range of motion and neck supple. No edema, erythema or rigidity. No spinous process tenderness or muscular tenderness. Normal range of motion.  Lymphadenopathy:     Head:     Right side of head: No submental, submandibular, tonsillar, preauricular, posterior auricular or occipital adenopathy.     Left side of head: No submental, submandibular, tonsillar, preauricular, posterior auricular or occipital adenopathy.     Cervical: No cervical adenopathy.  Right cervical: No superficial, deep or posterior cervical adenopathy.    Left cervical: No superficial, deep or posterior cervical adenopathy.     Upper Body:     Right upper body: No supraclavicular or pectoral adenopathy.     Left upper body: No supraclavicular or pectoral adenopathy.  Skin:    General: Skin is warm and dry.     Coloration: Skin is not pale.     Findings: No abrasion,  bruising, burn, ecchymosis, erythema, lesion, petechiae or rash.     Nails: There is no clubbing.  Neurological:     Mental Status: She is alert and oriented to person, place, and time.     GCS: GCS eye subscore is 4. GCS verbal subscore is 5. GCS motor subscore is 6.     Cranial Nerves: No cranial nerve deficit.     Sensory: No sensory deficit.     Motor: No tremor, atrophy, abnormal muscle tone or seizure activity.     Coordination: Coordination normal.     Gait: Gait normal.     Deep Tendon Reflexes: Reflexes are normal and symmetric. Reflexes normal. Babinski sign absent on the right side. Babinski sign absent on the left side.     Reflex Scores:      Tricep reflexes are 2+ on the right side and 2+ on the left side.      Bicep reflexes are 2+ on the right side and 2+ on the left side.      Brachioradialis reflexes are 2+ on the right side and 2+ on the left side.      Patellar reflexes are 2+ on the right side and 2+ on the left side.      Achilles reflexes are 2+ on the right side and 2+ on the left side. Psychiatric:        Attention and Perception: Attention normal.        Mood and Affect: Affect normal. Mood is anxious.        Speech: Speech normal.        Behavior: Behavior is hyperactive. Behavior is cooperative.        Thought Content: Thought content normal.        Cognition and Memory: Cognition and memory normal.        Judgment: Judgment normal.      Depression Screen PHQ 2/9 Scores 05/13/2013  PHQ - 2 Score 0      Assessment & Plan:     Routine Health Maintenance and Physical Exam  Exercise Activities and Dietary recommendations Goals   None      There is no immunization history on file for this patient.  Health Maintenance  Topic Date Due  . TETANUS/TDAP  Never done  . PAP SMEAR-Modifier  Never done  . INFLUENZA VACCINE  Never done  . HIV Screening  Completed   refferals to GI, and allergist and to obgyn an neuro.    Discussed health  benefits of physical activity, and encouraged her to engage in regular exercise appropriate for her age and condition.     History of abortion- 05/18/2020  - Plan: POCT urine pregnancy, Ambulatory referral to Obstetrics / Gynecology, US Pelvic Complete With Transvaginal  Vaginal bleeding - Plan: POCT urine pregnancy, Ambulatory referral to Obstetrics / Gynecology, hCG, serum, qualitative, US Pelvic Complete With Transvaginal  History of Crohn's disease - Plan: Ambulatory referral to Gastroenterology  Allergic reaction, initial encounter - Plan: Ambulatory referral to Allergy  Crohn's disease with complication,  unspecified gastrointestinal tract location [facility]), Chronic - Plan: Ambulatory referral to Gastroenterology, CBC with Differential/Platelet, Comprehensive Metabolic Panel (CMET)  Body mass index 28.0-28.9, adult  Encounter for routine adult health examination without abnormal findings - Plan: POCT urinalysis dipstick  History of cocaine use - Plan: Ambulatory referral to Neurology, Ambulatory referral to Obstetrics / Gynecology, Ambulatory referral to Psychiatry  Marijuana smoker - Plan: Ambulatory referral to Neurology, Ambulatory referral to Obstetrics / Gynecology, Ambulatory referral to Psychiatry, Pain Mgt Scrn (14 Drugs), Ur  BPPV (benign paroxysmal positional vertigo), unspecified laterality -reported history  - Plan: Ambulatory referral to Neurology, Pain Mgt Scrn (14 Drugs), Ur  Chronic tension-type headache, intractable - Plan: Ambulatory referral to Neurology  History of genital warts - Plan: Ambulatory referral to Obstetrics / Gynecology, HIV antibody (with reflex), RPR, Hepatitis c antibody (reflex), Hepatitis panel, acute  Allergic reaction to food, initial encounter - Plan: Ambulatory referral to Allergy  Food allergic contact dermatitis - Plan: Ambulatory referral to Allergy  Facial swelling - Plan: Ambulatory referral to Allergy  Alcohol use - Plan: Ambulatory  referral to Neurology, Ambulatory referral to Obstetrics / Gynecology, Ethanol  History of suicide attempt - Plan: Ambulatory referral to Obstetrics / Gynecology  Suicidal thoughts - Plan: Ambulatory referral to Obstetrics / Gynecology  Family planning initiation - Plan: Ambulatory referral to Obstetrics / Gynecology  Thyroid disorder screening - Plan: TSH  Screening, lipid - Plan: Lipid Panel w/o Chol/HDL Ratio  Body mass index 29.0-29.9, adult   Orders Placed This Encounter  Procedures  . US Pelvic Complete With Transvaginal    Standing Status:   Future    Standing Expiration Date:   10/13/2021    Scheduling Instructions:     As soon as possible    Order Specific Question:   Reason for Exam (SYMPTOM  OR DIAGNOSIS REQUIRED)    Answer:   vaginal bleeding since abortion 04/2019    Order Specific Question:   Preferred imaging location?    Answer:   [facility]    Order Specific Question:   Call Results- Best Contact Number?    Answer:   [phone]  . CBC with Differential/Platelet  . Comprehensive Metabolic Panel (CMET)  . TSH  . hCG, serum, qualitative  . Lipid Panel w/o Chol/HDL Ratio  . HIV antibody (with reflex)  . RPR  . Hepatitis c antibody (reflex)  . Hepatitis panel, acute  . Pain Mgt Scrn (14 Drugs), Ur  . Ethanol  . Ambulatory referral to Gastroenterology    Referral Priority:   Routine    Referral Type:   Consultation    Referral Reason:   Specialty Services Required    Referred to Provider:   Lin Landsman, MD    Number of Visits Requested:   1  . Ambulatory referral to Neurology    Referral Priority:   Urgent    Referral Type:   Consultation    Referral Reason:   Specialty Services Required    Referred to Provider:   Vladimir Crofts, MD    Requested Specialty:   Neurology    Number of Visits Requested:   1  . Ambulatory referral to Obstetrics / Gynecology    Referral Priority:   Urgent    Referral Type:   Consultation    Referral Reason:    Specialty Services Required    Referred to Provider:   Diona Fanti, CNM    Requested Specialty:   Obstetrics and Gynecology    Number of  Visits Requested:   1  . Ambulatory referral to Psychiatry    Referral Priority:   Urgent    Referral Type:   Psychiatric    Referral Reason:   Specialty Services Required    Requested Specialty:   Psychiatry    Number of Visits Requested:   1  . Ambulatory referral to Allergy    Referral Priority:   Urgent    Referral Type:   Allergy Testing    Referral Reason:   Specialty Services Required    Requested Specialty:   Allergy    Number of Visits Requested:   1  . POCT urinalysis dipstick  . POCT urine pregnancy    Meds ordered this encounter  Medications  . pantoprazole (PROTONIX) 40 MG tablet    Sig: Take 1 tablet (40 mg total) by mouth daily.    Dispense:  30 tablet    Refill:  0  . EPINEPHrine 0.3 mg/0.3 mL IJ SOAJ injection    Sig: Inject 0.3 mLs (0.3 mg total) into the muscle as needed for anaphylaxis.    Dispense:  1 each    Refill:  1  ' Epi Pen instructions and video watched or administration.  GERD given Protonix.  side effects and use of all prescribed medications discussed.   Call if not heard from referrals within 2 weeks.  Advised patient call the office or your primary care doctor for an appointment if no improvement within 72 hours or if any symptoms change or worsen at any time  Advised ER or urgent Care if after hours or on weekend. Call 911 for emergency symptoms at any time.Patinet verbalized understanding of all instructions given/reviewed and treatment plan and has no further questions or concerns at this time.    Return in about 1 month (around 11/09/2019), or if symptoms worsen or fail to improve, for at any time for any worsening symptoms, Call 911 for emergencies.  The entirety of the information documented in the History of Present Illness, Review of Systems and Physical Exam were personally obtained by  me. Portions of this information were initially documented by the  Certified Medical Assistant whose name is documented in [place] and reviewed by me for thoroughness and accuracy.  I have personally performed the exam and reviewed the chart and it is accurate to the best of my knowledge.  Haematologist has been used and any errors in dictation or transcription are unintentional.  Kelby Aline. [facility]  There are other unrelated non-urgent complaints, but due to the busy schedule and the amount of time I've already spent with her, time does not permit me to address these routine issues at today's visit. I've requested another appointment to review these additional issues.  --------------------------------------------------------------------

## 2019-10-12 ENCOUNTER — Encounter: Payer: Self-pay | Admitting: Adult Health

## 2019-10-12 DIAGNOSIS — R220 Localized swelling, mass and lump, head: Secondary | ICD-10-CM | POA: Insufficient documentation

## 2019-10-12 DIAGNOSIS — F1290 Cannabis use, unspecified, uncomplicated: Secondary | ICD-10-CM | POA: Insufficient documentation

## 2019-10-12 DIAGNOSIS — T781XXA Other adverse food reactions, not elsewhere classified, initial encounter: Secondary | ICD-10-CM | POA: Insufficient documentation

## 2019-10-12 DIAGNOSIS — Z8719 Personal history of other diseases of the digestive system: Secondary | ICD-10-CM | POA: Insufficient documentation

## 2019-10-12 DIAGNOSIS — Z9151 Personal history of suicidal behavior: Secondary | ICD-10-CM | POA: Insufficient documentation

## 2019-10-12 DIAGNOSIS — Z789 Other specified health status: Secondary | ICD-10-CM | POA: Insufficient documentation

## 2019-10-12 DIAGNOSIS — Z8759 Personal history of other complications of pregnancy, childbirth and the puerperium: Secondary | ICD-10-CM | POA: Insufficient documentation

## 2019-10-12 DIAGNOSIS — H8110 Benign paroxysmal vertigo, unspecified ear: Secondary | ICD-10-CM | POA: Insufficient documentation

## 2019-10-12 DIAGNOSIS — Z6829 Body mass index (BMI) 29.0-29.9, adult: Secondary | ICD-10-CM | POA: Insufficient documentation

## 2019-10-12 DIAGNOSIS — Z915 Personal history of self-harm: Secondary | ICD-10-CM | POA: Insufficient documentation

## 2019-10-12 DIAGNOSIS — Z8619 Personal history of other infectious and parasitic diseases: Secondary | ICD-10-CM | POA: Insufficient documentation

## 2019-10-12 DIAGNOSIS — Z7289 Other problems related to lifestyle: Secondary | ICD-10-CM | POA: Insufficient documentation

## 2019-10-12 DIAGNOSIS — N939 Abnormal uterine and vaginal bleeding, unspecified: Secondary | ICD-10-CM | POA: Insufficient documentation

## 2019-10-12 DIAGNOSIS — R45851 Suicidal ideations: Secondary | ICD-10-CM | POA: Insufficient documentation

## 2019-10-12 DIAGNOSIS — Z87898 Personal history of other specified conditions: Secondary | ICD-10-CM | POA: Insufficient documentation

## 2019-10-12 DIAGNOSIS — T7840XA Allergy, unspecified, initial encounter: Secondary | ICD-10-CM | POA: Insufficient documentation

## 2019-10-12 DIAGNOSIS — F1090 Alcohol use, unspecified, uncomplicated: Secondary | ICD-10-CM | POA: Insufficient documentation

## 2019-10-12 DIAGNOSIS — L236 Allergic contact dermatitis due to food in contact with the skin: Secondary | ICD-10-CM | POA: Insufficient documentation

## 2019-10-12 DIAGNOSIS — G44221 Chronic tension-type headache, intractable: Secondary | ICD-10-CM | POA: Insufficient documentation

## 2019-10-12 DIAGNOSIS — F1491 Cocaine use, unspecified, in remission: Secondary | ICD-10-CM | POA: Insufficient documentation

## 2019-10-17 ENCOUNTER — Emergency Department
Admission: EM | Admit: 2019-10-17 | Discharge: 2019-10-17 | Disposition: A | Discharge status: Home / Self Care | Payer: Medicaid Other | Attending: Emergency Medicine | Admitting: Emergency Medicine

## 2019-10-17 ENCOUNTER — Other Ambulatory Visit: Payer: Self-pay

## 2019-10-17 DIAGNOSIS — R197 Diarrhea, unspecified: Secondary | ICD-10-CM | POA: Diagnosis present

## 2019-10-17 DIAGNOSIS — Z79899 Other long term (current) drug therapy: Secondary | ICD-10-CM | POA: Insufficient documentation

## 2019-10-17 DIAGNOSIS — F17210 Nicotine dependence, cigarettes, uncomplicated: Secondary | ICD-10-CM | POA: Diagnosis not present

## 2019-10-17 DIAGNOSIS — R110 Nausea: Secondary | ICD-10-CM

## 2019-10-17 LAB — URINALYSIS, COMPLETE (UACMP) WITH MICROSCOPIC
Bacteria, UA: NONE SEEN
Bilirubin Urine: NEGATIVE
Glucose, UA: NEGATIVE mg/dL
Hgb urine dipstick: NEGATIVE
Ketones, ur: NEGATIVE mg/dL
Leukocytes,Ua: NEGATIVE
Nitrite: NEGATIVE
Protein, ur: NEGATIVE mg/dL
Specific Gravity, Urine: 1.028 (ref 1.005–1.030)
pH: 5 (ref 5.0–8.0)

## 2019-10-17 LAB — CBC
HCT: 36.9 % (ref 36.0–46.0)
Hemoglobin: 11.5 g/dL — ABNORMAL LOW (ref 12.0–15.0)
MCH: 24.8 pg — ABNORMAL LOW (ref 26.0–34.0)
MCHC: 31.2 g/dL (ref 30.0–36.0)
MCV: 79.7 fL — ABNORMAL LOW (ref 80.0–100.0)
Platelets: 254 10*3/uL (ref 150–400)
RBC: 4.63 MIL/uL (ref 3.87–5.11)
RDW: 17.4 % — ABNORMAL HIGH (ref 11.5–15.5)
WBC: 4.2 10*3/uL (ref 4.0–10.5)
nRBC: 0 % (ref 0.0–0.2)

## 2019-10-17 LAB — COMPREHENSIVE METABOLIC PANEL
ALT: 14 U/L (ref 0–44)
AST: 18 U/L (ref 15–41)
Albumin: 3.8 g/dL (ref 3.5–5.0)
Alkaline Phosphatase: 44 U/L (ref 38–126)
Anion gap: 10 (ref 5–15)
BUN: 17 mg/dL (ref 6–20)
CO2: 20 mmol/L — ABNORMAL LOW (ref 22–32)
Calcium: 9 mg/dL (ref 8.9–10.3)
Chloride: 108 mmol/L (ref 98–111)
Creatinine, Ser: 0.88 mg/dL (ref 0.44–1.00)
GFR calc Af Amer: >60 mL/min (ref >60)
GFR calc non Af Amer: >60 mL/min (ref >60)
Glucose, Bld: 108 mg/dL — ABNORMAL HIGH (ref 70–99)
Potassium: 3.6 mmol/L (ref 3.5–5.1)
Sodium: 138 mmol/L (ref 135–145)
Total Bilirubin: 0.6 mg/dL (ref 0.3–1.2)
Total Protein: 6.8 g/dL (ref 6.5–8.1)

## 2019-10-17 LAB — LIPASE, BLOOD: Lipase: 34 U/L (ref 11–51)

## 2019-10-17 LAB — POCT PREGNANCY, URINE: Preg Test, Ur: NEGATIVE

## 2019-10-17 MED ORDER — DIPHENOXYLATE-ATROPINE 2.5-0.025 MG PO TABS: 1.0000 | ORAL_TABLET | Freq: Four times a day (QID) | ORAL | 0 refills | Status: DC | PRN

## 2019-10-17 MED ORDER — DIPHENOXYLATE-ATROPINE 2.5-0.025 MG PO TABS
2.0000 | ORAL_TABLET | Freq: Once | ORAL | Status: AC
  Administered 2019-10-17: 2 via ORAL
  Filled 2019-10-17: qty 2

## 2019-10-17 MED ORDER — ONDANSETRON 8 MG PO TBDP
8.0000 mg | ORAL_TABLET | Freq: Once | ORAL | Status: AC
  Administered 2019-10-17: 8 mg via ORAL
  Filled 2019-10-17: qty 1

## 2019-10-17 MED ORDER — ONDANSETRON HCL 8 MG PO TABS: 8.0000 mg | ORAL_TABLET | Freq: Three times a day (TID) | ORAL | 0 refills | Status: DC | PRN

## 2019-10-17 NOTE — ED Provider Notes (Signed)
[facility] Emergency Department Provider Note   ____________________________________________   First MD Initiated Contact with Patient 10/17/19 1340     (approximate)  I have reviewed the triage vital signs and the nursing notes.   HISTORY  Chief Complaint Diarrhea    HPI Kristen Chung is a 34 y.o. female patient complain of 3 days of diarrhea.  Patient also state intimating fever headache and nausea.  Patient denies vomiting.  Patient denies blood in stool.  Patient denies recent travel or known contact with COVID-19.  Patient denies abdominal or flank pain.  Patient denies urinary complaints.         Past Medical History:  Diagnosis Date  . Allergy   . Anemia   . Anxiety   . BPPV (benign paroxysmal positional vertigo)   . Chlamydia   . Crohn disease ([place])   . Depression    No specific treatment  . Esophagitis   . Gastritis   . Genital warts   . Gonorrhea   . Hearing loss   . Heart murmur   . Hiatal hernia   . Kidney stone   . PID (acute pelvic inflammatory disease)   . Pregnancy induced hypertension   . Preterm labor   . Substance abuse ([place])   . Urinary tract infection     Patient Active Problem List   Diagnosis Date Noted  . Allergic reaction 10/12/2019  . History of Crohn's disease 10/12/2019  . Vaginal bleeding 10/12/2019  . History of abortion- 05/18/2020  10/12/2019  . Body mass index 29.0-29.9, adult 10/12/2019  . History of cocaine use 10/12/2019  . Marijuana smoker 10/12/2019  . BPPV (benign paroxysmal positional vertigo), unspecified laterality -reported history  10/12/2019  . Chronic tension-type headache, intractable 10/12/2019  . History of genital warts 10/12/2019  . Allergic reaction to food 10/12/2019  . Food allergic contact dermatitis 10/12/2019  . Facial swelling 10/12/2019  . Alcohol use 10/12/2019  . History of suicide attempt 10/12/2019  . Suicidal thoughts 10/12/2019  . Elevated BP without  diagnosis of hypertension 09/14/2019  . Shortness of breath 08/28/2018  . Sensorineural hearing loss (SNHL) of both ears 05/05/2018  . Tinnitus of right ear 05/05/2018  . Breast pain in female 05/13/2013  . Crohn disease ([place]) 05/13/2013  . Cocaine substance abuse ([place]) 05/13/2013  . Tobacco abuse counseling 05/13/2013  . Routine health maintenance 05/13/2013  . Difficulty swallowing 05/13/2013  . 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 abortion     x2    Prior to Admission medications   Medication Sig Start Date End Date Taking? Authorizing Provider  diphenhydrAMINE (BENADRYL) 25 mg capsule Take 1 capsule (25 mg total) by mouth every 6 (six) hours as needed. 11/09/15   Gloriann Loan, PA-C  diphenoxylate-atropine (LOMOTIL) 2.5-0.025 MG tablet Take 1 tablet by mouth 4 (four) times daily as needed for diarrhea or loose stools. 10/17/19 10/16/20  Sable Feil, PA-C  EPINEPHrine 0.3 mg/0.3 mL IJ SOAJ injection Inject 0.3 mLs (0.3 mg total) into the muscle once. Patient not taking: Reported on 04/22/2019 11/09/15   Gloriann Loan, PA-C  EPINEPHrine 0.3 mg/0.3 mL IJ SOAJ injection Inject 0.3 mLs (0.3 mg total) into the muscle as needed for anaphylaxis. 10/09/19   Flinchum, Kelby Aline, FNP  Meclizine HCl 25 MG CHEW Chew by mouth.    [provider]  MECLIZINE HCL PO Take by mouth.  [provider]  megestrol (MEGACE) 40 MG tablet Take one tablet by mouth twice a day until the bleeding stops.  If the bleeding has stopped, then go to once a day for 2 days and then stop taking the medication to taper it off.  If you start bleeding again, you can begin the cycle again. 09/15/19   Burleson, Rona Ravens, NP  omeprazole (PRILOSEC) 10 MG capsule Take by mouth.    [provider]  ondansetron (ZOFRAN) 8 MG tablet Take 1 tablet (8 mg total) by mouth every 8 (eight) hours as needed for nausea or vomiting. 10/17/19   Sable Feil, PA-C  pantoprazole (PROTONIX) 40 MG tablet Take 1 tablet (40 mg total) by mouth daily. 10/09/19   Flinchum, Kelby Aline, FNP    Allergies Celery oil, Codone [hydrocodone], Dilaudid [hydromorphone hcl], Hydromorphone hcl, Strawberry extract, and Tramadol  Family History  Problem Relation Age of Onset  . Asthma Mother   . Heart disease Mother   . Diabetes Mother   . Hypertension Mother   . Cancer Father        Breast  . Asthma Sister   . Depression Sister   . Diabetes Sister   . Hypertension Sister   . Diabetes Other   . Anesthesia problems Neg Hx     Social History Social History   Tobacco Use  . Smoking status: Current Some Day Smoker    Packs/day: 0.25    Years: 6.00    Pack years: 1.50    Types: Cigarettes  . Smokeless tobacco: Never Used  Substance Use Topics  . Alcohol use: Yes    Alcohol/week: 9.0 - 16.0 standard drinks    Types: 4 - 6 Cans of beer, 5 - 10 Shots of liquor per week  . Drug use: Yes    Types: Cocaine, Marijuana    Review of Systems Constitutional: No fever/chills Eyes: No visual changes. ENT: No sore throat. Cardiovascular: Denies chest pain. Respiratory: Denies shortness of breath. Gastrointestinal: No abdominal pain.  No nausea, no vomiting.  No diarrhea.  No constipation. Genitourinary: Negative for dysuria. Musculoskeletal: Negative for back pain. Skin: Negative for rash. Neurological: Negative for headaches, focal weakness or numbness. Psychiatric:  Anxiety/ depression and substance abuse. Endocrine:  Hypertension Allergic/Immunilogical: Celery oil, codeine, hydromorphone, strawberry extract, and tramadol. ____________________________________________   PHYSICAL EXAM:  VITAL SIGNS: ED Triage Vitals  Enc Vitals Group     BP 10/17/19 1231 (!) 141/102     Pulse Rate 10/17/19 1231 78     Resp 10/17/19 1231 16     Temp 10/17/19 1231 98.6 F (37 C)     Temp Source 10/17/19 1231 Oral     SpO2 10/17/19 1231 100 %     Weight  10/17/19 1232 162 lb (73.5 kg)     Height 10/17/19 1232 5' 3"  (1.6 m)     Head Circumference --      Peak Flow --      Pain Score 10/17/19 1232 0     Pain Loc --      Pain Edu? --      Excl. in [place]? --     Constitutional: Alert and oriented. Well appearing and in no acute distress. Eyes: Conjunctivae are normal. PERRL. EOMI. Head: Atraumatic. Nose: No congestion/rhinnorhea. Mouth/Throat: Mucous membranes are moist.  Oropharynx non-erythematous. Cardiovascular: Normal rate, regular rhythm. Grossly normal heart sounds.  Good peripheral circulation.  Elevated diastolic blood pressure. Respiratory: Normal respiratory effort.  No retractions.  Lungs CTAB. Gastrointestinal: Hyperactive bowel sounds.  Soft and nontender. No distention. No abdominal bruits. No CVA tenderness. Genitourinary: Deferred Skin:  Skin is warm, dry and intact. No rash noted. Psychiatric: Mood and affect are normal. Speech and behavior are normal.  ____________________________________________   LABS (all labs ordered are listed, but only abnormal results are displayed)  Labs Reviewed  COMPREHENSIVE METABOLIC PANEL - Abnormal; Notable for the following components:      Result Value   CO2 20 (*)    Glucose, Bld 108 (*)    All other components within normal limits  CBC - Abnormal; Notable for the following components:   Hemoglobin 11.5 (*)    MCV 79.7 (*)    MCH 24.8 (*)    RDW 17.4 (*)    All other components within normal limits  URINALYSIS, COMPLETE (UACMP) WITH MICROSCOPIC - Abnormal; Notable for the following components:   Color, Urine YELLOW (*)    APPearance CLEAR (*)    All other components within normal limits  LIPASE, BLOOD  POC URINE PREG, ED  POCT PREGNANCY, URINE   ____________________________________________  EKG   ____________________________________________  RADIOLOGY  ED MD interpretation:    Official radiology report(s): No results found.   ____________________________________________   PROCEDURES  Procedure(s) performed (including Critical Care):  Procedures   ____________________________________________   INITIAL IMPRESSION / ASSESSMENT AND PLAN / ED COURSE  As part of my medical decision making, I reviewed the following data within the [place]      Patient presents with 2 days of diarrhea.  Patient also states nausea but denies vomiting.  Patient had 2 episodes of loose stools while in the ED.  Physical exam was remarkable for hyper active bowel sounds.  Discussed the labs are unremarkable.  Patient given discharge care instruction advised take medication as directed.  Patient given a work note advised follow-up PCP.    Kristen Chung was evaluated in Emergency Department on 10/17/2019 for the symptoms described in the history of present illness. She was evaluated in the context of the global COVID-19 pandemic, which necessitated consideration that the patient might be at risk for infection with the SARS-CoV-2 virus that causes COVID-19. Institutional protocols and algorithms that pertain to the evaluation of patients at risk for COVID-19 are in a state of rapid change based on information released by regulatory bodies including the CDC and federal and state organizations. These policies and algorithms were followed during the patient's care in the ED.       ____________________________________________   FINAL CLINICAL IMPRESSION(S) / ED DIAGNOSES  Final diagnoses:  Diarrhea, unspecified type  Nausea     ED Discharge Orders         Ordered    ondansetron (ZOFRAN) 8 MG tablet  Every 8 hours PRN     10/17/19 1457    diphenoxylate-atropine (LOMOTIL) 2.5-0.025 MG tablet  4 times daily PRN     10/17/19 1457           Note:  This document was prepared using Dragon voice recognition software and may include unintentional dictation errors.    Sable Feil, PA-C 10/17/19 1501     Lavonia Drafts, MD 10/18/19 [phone]

## 2019-10-17 NOTE — ED Triage Notes (Signed)
Pt A&O, ambulatory. States for 3 days she has had diarrhea, fever, HA, and nausea. Denies vomiting. Denies blood in diarrhea.

## 2019-10-17 NOTE — Discharge Instructions (Signed)
Follow discharge care instruction take medication as directed.

## 2019-10-19 ENCOUNTER — Ambulatory Visit: Payer: Medicaid Other | Attending: Adult Health

## 2019-10-19 ENCOUNTER — Telehealth: Payer: Self-pay

## 2019-10-19 NOTE — Telephone Encounter (Signed)
Copied from [place] [id number]. Topic: General - Inquiry >> Oct 19, 2019 11:42 AM Percell Belt A wrote: Reason for CRM: Just fyi ultrasound department  at [facility] called and state pt did not show up for her ultrasound today

## 2019-10-20 NOTE — Telephone Encounter (Signed)
Unable to reach patient at this time, no voice answering service. KW

## 2019-10-23 ENCOUNTER — Encounter: Payer: Self-pay | Admitting: *Deleted

## 2019-10-27 IMAGING — CR DG CHEST 2V
2 series · 2 of 2 positions shown · non-contrast
Comparison: Chest radiograph July 01, 2016

CLINICAL DATA: Chest pain, hypertensive.  Dizziness.

EXAM:
CHEST - 2 VIEW

[w chest pa]
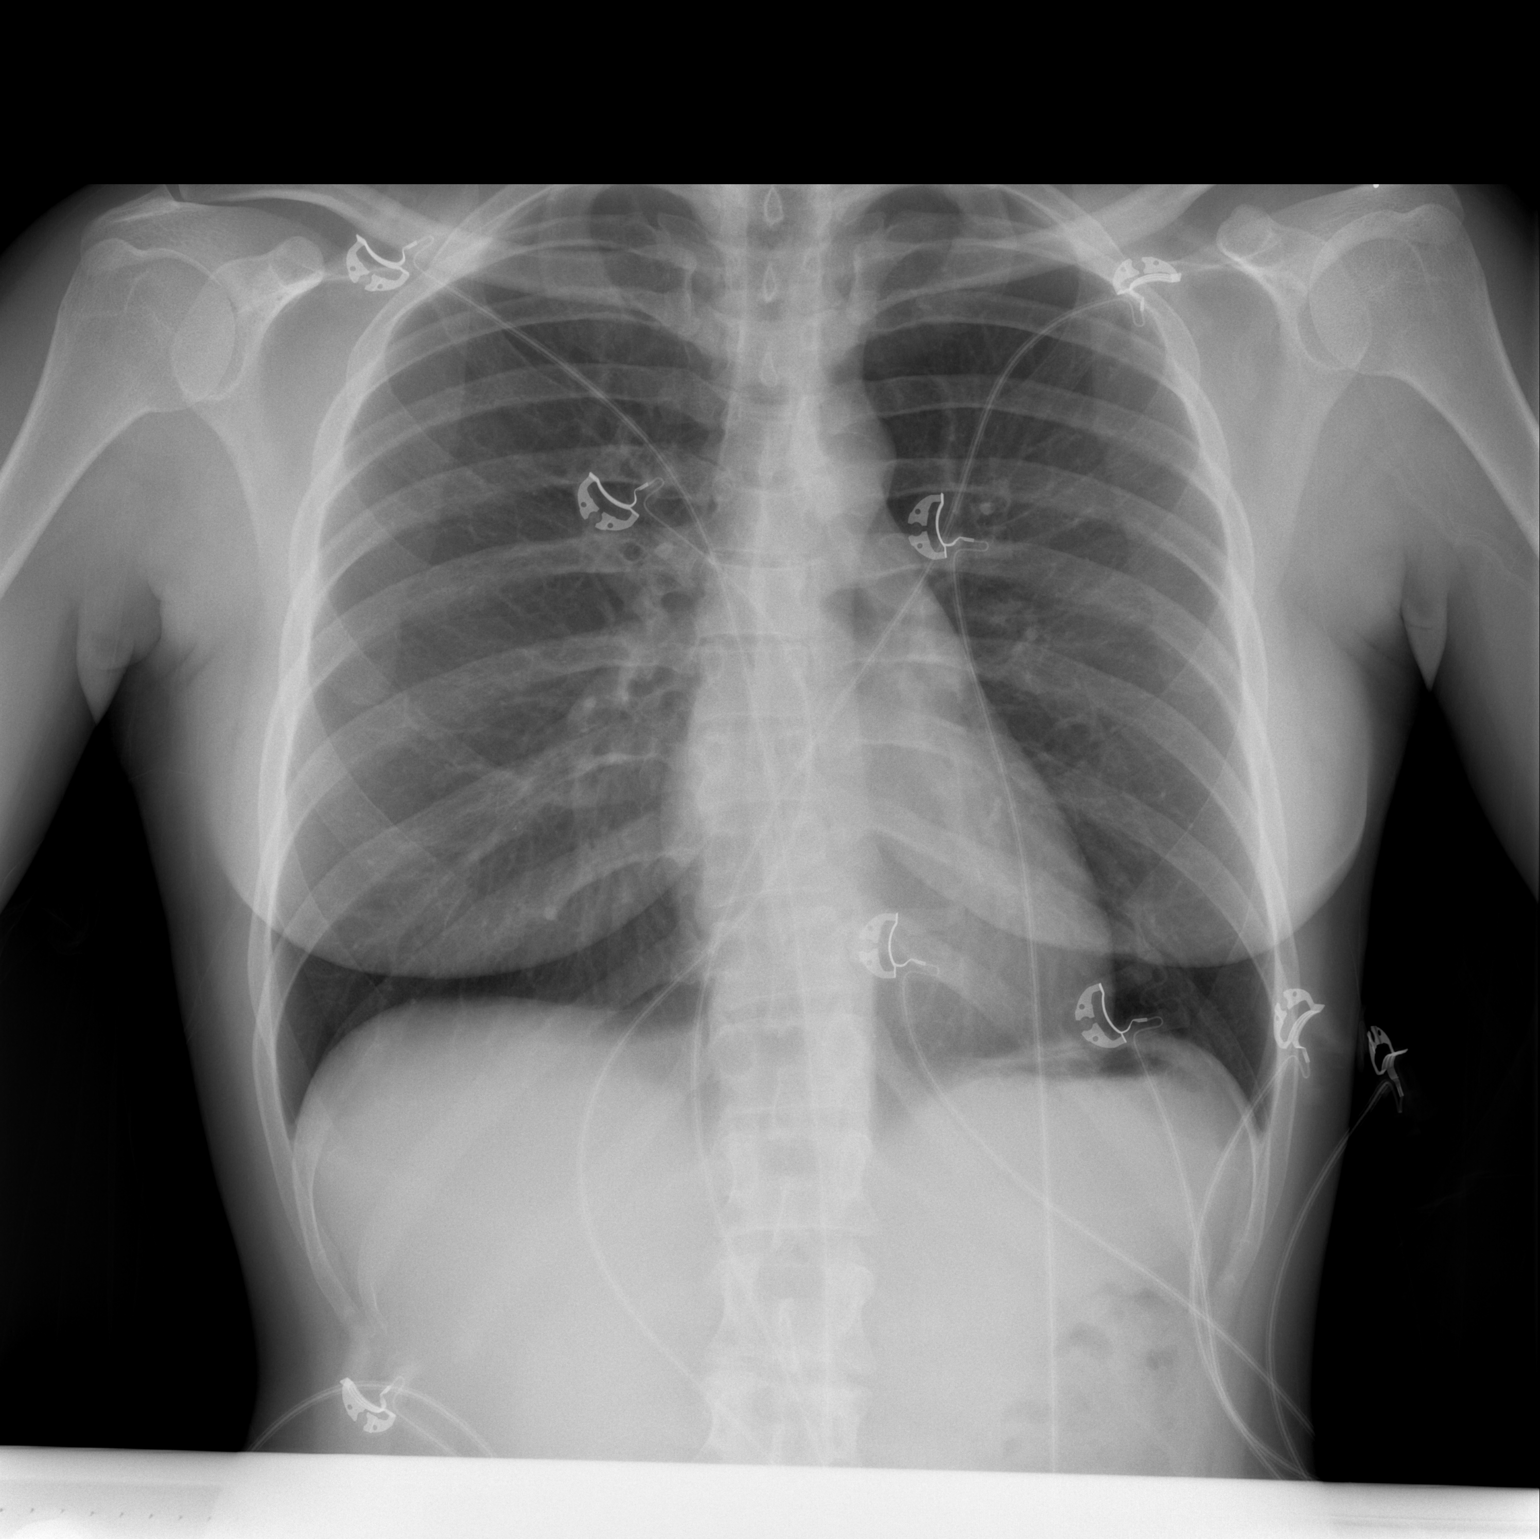

[w chest lat]
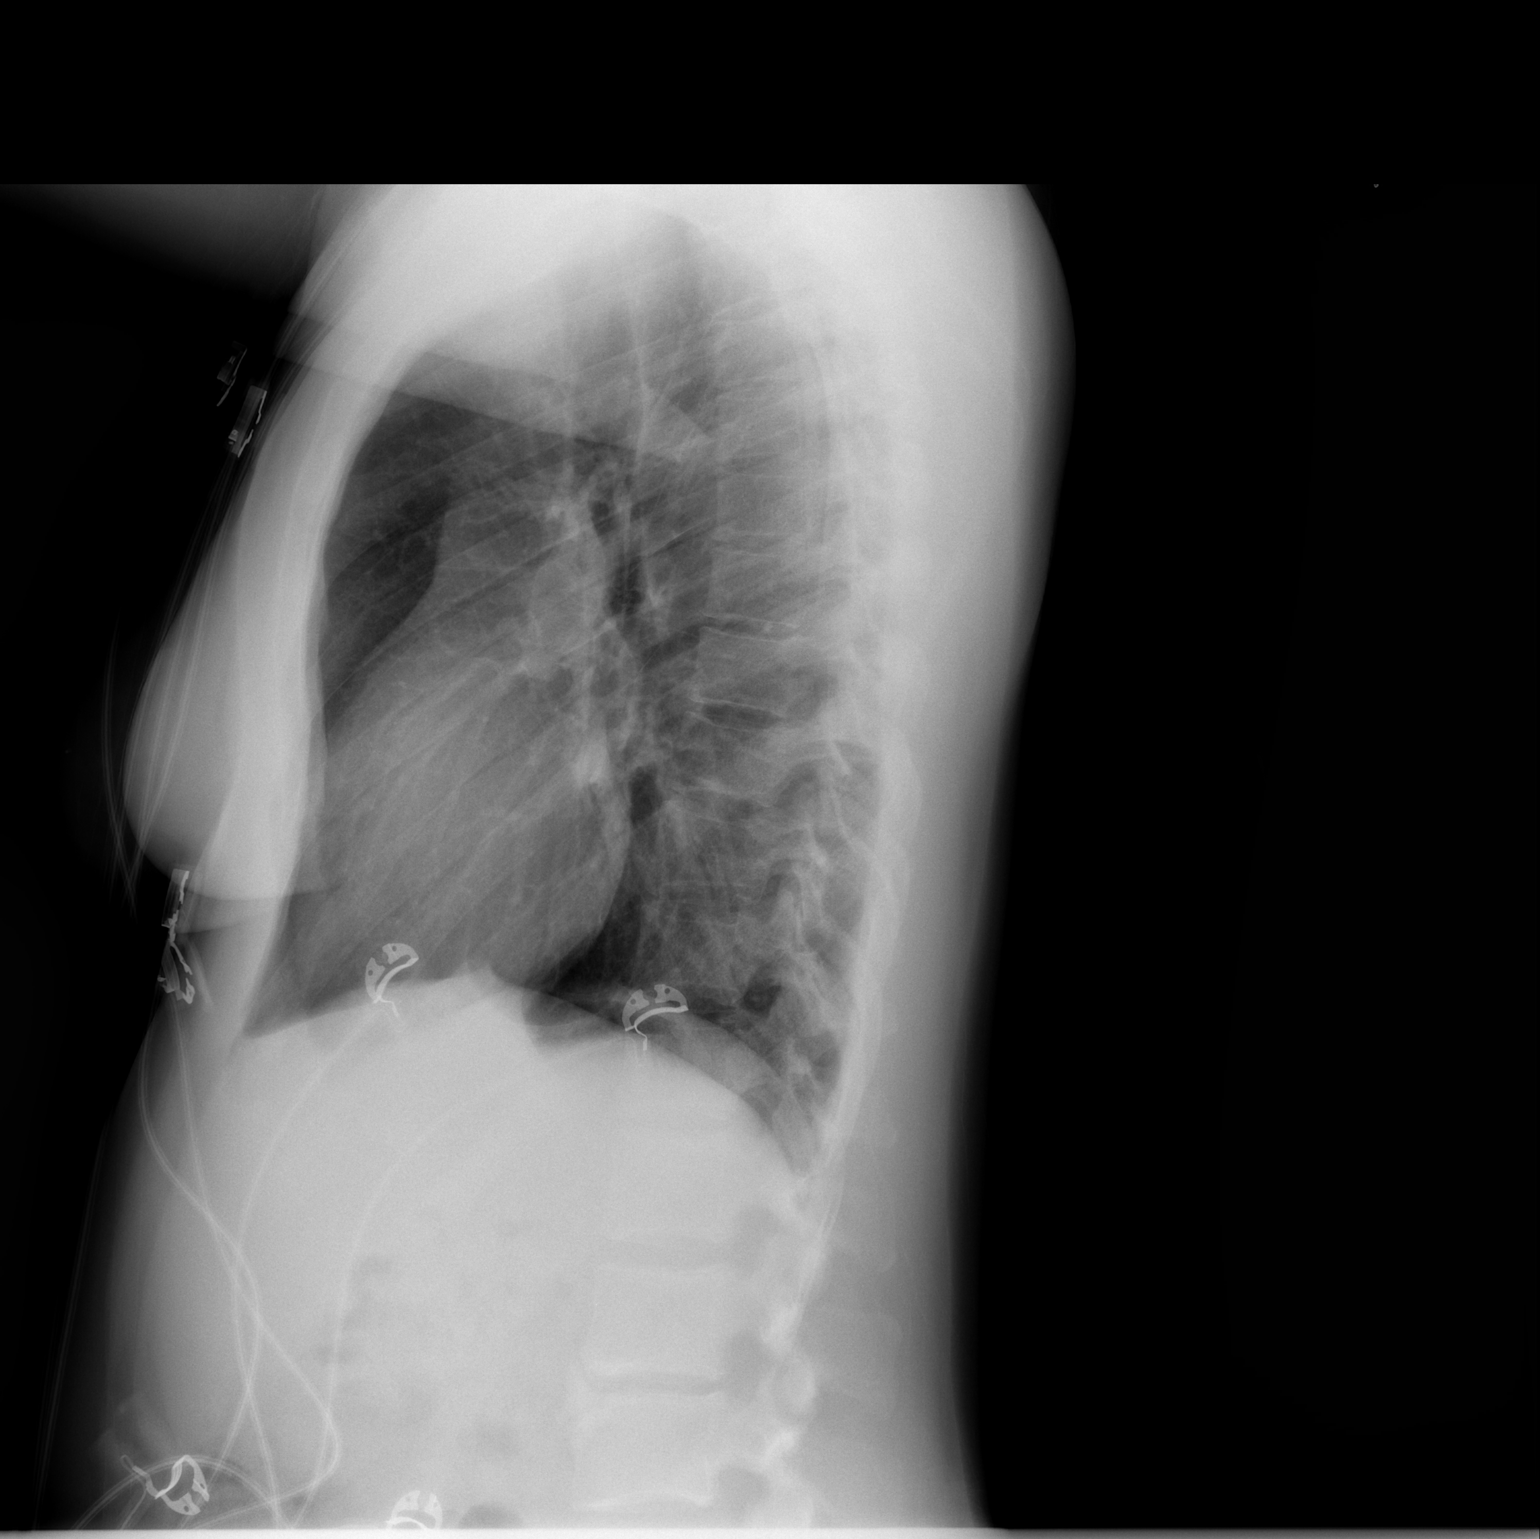

[2 of 2 positions shown; findings below may reference images not displayed]

FINDINGS: Cardiomediastinal silhouette is normal. No pleural effusions or
focal consolidations. Trachea projects midline and there is no
pneumothorax. Soft tissue planes and included osseous structures are
non-suspicious. Mild single level upper lumbar degenerative disc and
endplate spurring.
IMPRESSION: Normal chest.

## 2019-10-30 DIAGNOSIS — R42 Dizziness and giddiness: Secondary | ICD-10-CM | POA: Insufficient documentation

## 2019-10-30 DIAGNOSIS — M542 Cervicalgia: Secondary | ICD-10-CM | POA: Insufficient documentation

## 2019-11-05 ENCOUNTER — Other Ambulatory Visit: Payer: Self-pay | Admitting: Adult Health

## 2019-11-05 DIAGNOSIS — R42 Dizziness and giddiness: Secondary | ICD-10-CM

## 2019-11-08 NOTE — Progress Notes (Signed)
New Patient Note  RE: Kristen Chung NEUROTH MRN: [id number] DOB: 12-30-1985 Date of Office Visit: 11/09/2019  Referring provider: Sharmon Leyden* Primary care provider: Doreen Beam, FNP  Chief Complaint: Allergic Reaction (Food vs Environmental?)  History of Present Illness: I had the pleasure of seeing Griselda Spain for initial evaluation at the [facility] [place] [facility] [place] on 11/09/2019. She is a 34 y.o. female, who is referred here by Doreen Beam, FNP for the evaluation of allergic reaction.  Skin:  Patient has been having issues with itchy skin after eating only. The itching worsens 20-30 minutes after eating. This can occur anywhere on her body. Sometimes she has rash with this with little red bumps.   Individual rashes lasts about a few hours after benadryl. No ecchymosis upon resolution. Associated symptoms include: none. Suspected triggers are unknown - sometimes she can eat certain foods without the itching and sometimes she eats the same food and has the itching. Strawberries, seafood/shellfish and certain spices seem to be more bothersome. Denies any fevers, chills, changes in medications, foods, personal care products or recent infections. She has tried the following therapies: benadryl with some benefit. Systemic steroids none.  Previous work up includes: none. Patient is up to date with the following cancer screening tests: not up to date with physical exam and pap smears.  She was evaluated in ED for this itching/rash in the past. She does have access to epinephrine autoinjector and not needed to use it.   Past work up includes: none. Dietary History: patient has been eating other foods including milk, eggs, peanut, treenuts, sesame, shellfish, seafood, soy, wheat, meats, fruits and vegetables.   She reports reading labels and avoiding lemon peppers, seasonings in diet completely.   Assessment and Plan: Kristen Chung is a 34 y.o. female with: Pruritus Patient  concerned about environmental and food allergies as she has been experiencing pruritus and rashes 20 to 30 minutes after eating.  Sometimes she can eat the same food with no issues.  Noted that strawberries, seafood, shellfish and certain spices seem to trigger this more.  No previous allergy evaluation.  Today's skin testing showed: Positive to grass, mold, cat. Borderline to oats.  Given these test results, not sure what exactly is triggering her symptoms.   Start taking zyrtec 42m daily.  Keep a food journal with your symptoms.   Start proper skin care as below. Get bloodwork to rule out other etiologies.   Avoid foods that seem to bother you - strawberry, seafood, shellfish.  For mild symptoms you can take over the counter antihistamines such as Benadryl and monitor symptoms closely. If symptoms worsen or if you have severe symptoms including breathing issues, throat closure, significant swelling, whole body hives, severe diarrhea and vomiting, lightheadedness then inject epinephrine and seek immediate medical care afterwards.  Food action plan given.   Adverse food reaction See assessment and plan as above.  Today's skin testing showed: Positive to grass, mold, cat. Borderline to oats.  Given these test results, not sure what exactly is triggering her symptoms.   Start taking zyrtec 174mdaily.  Keep a food journal with your symptoms.   Avoid foods that seem to bother you - strawberry, seafood, shellfish.  For mild symptoms you can take over the counter antihistamines such as Benadryl and monitor symptoms closely. If symptoms worsen or if you have severe symptoms including breathing issues, throat closure, significant swelling, whole body hives, severe diarrhea and vomiting, lightheadedness then inject  epinephrine and seek immediate medical care afterwards.  Food action plan given.   Other allergic rhinitis Rhinoconjunctivitis symptoms the last few weeks.  Does not take  medications for this and no previous allergy testing.  Today's skin testing showed: Positive to grass, mold, cat.   Start environmental allergy control measures.   Start zyrtec 53m daily.  May use Flonase 1 spray per nostril daily as needed for sinus symptoms.  May use olopatadine eye drops 0.2% once a day as needed for itchy/watery eyes.  Wheezing No history of asthma but noticed wheezing at times. Not sure if it's coming from chest or her nose.  Today's spirometry was normal.  Monitor symptoms. If still having issues then will prescribe albuterol next.   Return in about 3 months (around 02/08/2020).  Meds ordered this encounter  Medications  . cetirizine (ZYRTEC ALLERGY) 10 MG tablet    Sig: Take 1 tablet (10 mg total) by mouth daily.    Dispense:  30 tablet    Refill:  5  . Olopatadine HCl 0.2 % SOLN    Sig: Apply 1 drop to eye daily as needed.    Dispense:  2.5 mL    Refill:  5  . fluticasone (FLONASE) 50 MCG/ACT nasal spray    Sig: Place 1-2 sprays into both nostrils daily.    Dispense:  16 g    Refill:  5    Lab Orders     Alpha-Gal Panel     CBC with Differential/Platelet     Chronic Urticaria     Tryptase     Thyroid Cascade Profile     ANA w/Reflex     Comprehensive metabolic panel     IgE Food Prof w/Component Rflx     Allergen, Strawberry, f44  Other allergy screening: Asthma: no Rhino conjunctivitis: yes  Noticed some rhino conjunctivitis symptoms the last few weeks. Does not take medications for this and no previous testing.  Medication allergy: yes Hymenoptera allergy: no Eczema:yes  Diagnostics: Spirometry:  Tracings reviewed. Her effort: It was hard to get consistent efforts and there is a question as to whether this reflects a maximal maneuver. FVC: 3.68L FEV1: 2.71L, 100% predicted FEV1/FVC ratio: 74% Interpretation: No overt abnormalities noted given today's efforts.  Please see scanned spirometry results for details.  Skin Testing:  Environmental allergy panel and food allergy panel.  Positive to grass, mold, cat.   Borderline to oats. Results discussed with patient/family. Airborne Adult Perc - 11/09/19 1553    Time Antigen Placed  1553    Allergen Manufacturer  GLavella Hammock   Location  Back    Number of Test  59    1. Control-Buffer 50% Glycerol  Negative    2. Control-Histamine 1 mg/ml  2+    3. Albumin saline  Negative    4. B[place] Negative    5. BGuatem[place] Negative    6. Johnson  Negative    7. Kentucky Blue  2+    8. Meadow Fescue  Negative    9. Perennial Rye  2+    10. Sweet Vernal  2+    11. Timothy  2+    12. Cocklebur  Negative    13. Burweed Marshelder  Negative    14. Ragweed, short  Negative    15. Ragweed, Giant  Negative    16. Plantain,  English  Negative    17. Lamb's Quarters  Negative    18. Sheep Sorrell  Negative  19. Rough Pigweed  Negative    20. Marsh Elder, Rough  Negative    21. Mugwort, Common  Negative    22. Ash mix  Negative    23. Birch mix  Negative    24. Beech American  Negative    25. Box, Elder  Negative    26. Cedar, red  Negative    27. Cottonwood, [place]  Negative    28. Elm mix  Negative    29. Hickory mix  Negative    30. Maple mix  Negative    31. Oak, [place] mix  Negative    32. Pecan Pollen  Negative    33. Pine mix  Negative    34. Sycamore Eastern  Negative    35. Peosta, Black Pollen  Negative    36. Alternaria alternata  Negative    37. Cladosporium Herbarum  Negative    38. Aspergillus mix  Negative    39. Penicillium mix  Negative    40. Bipolaris sorokiniana (Helminthosporium)  Negative    41. Drechslera spicifera (Curvularia)  Negative    42. Mucor plumbeus  Negative    43. Fusarium moniliforme  Negative    44. Aureobasidium pullulans (pullulara)  Negative    45. Rhizopus oryzae  Negative    46. Botrytis cinera  Negative    47. Epicoccum nigrum  Negative    48. Phoma betae  Negative    49. Candida Albicans  Negative    50. Trichophyton  mentagrophytes  Negative    51. Mite, D Farinae  5,000 AU/ml  Negative    52. Mite, D Pteronyssinus  5,000 AU/ml  Negative    53. Cat Hair 10,000 BAU/ml  Negative    54.  Dog Epithelia  Negative    55. Mixed Feathers  Negative    56. Horse Epithelia  Negative    57. Cockroach, German  Negative    58. Mouse  Negative    59. Tobacco Leaf  Negative    Comments  n     Intradermal - 11/09/19 1554    Time Antigen Placed  1554    Allergen Manufacturer  Lavella Hammock    Location  Arm    Number of Test  14    Control  Negative    [place]  Negative    Johnson  Negative    Ragweed mix  Negative    Weed mix  Negative    Tree mix  Negative    Mold 1  Negative    Mold 2  Negative    Mold 3  Negative    Mold 4  2+    Cat  2+    Dog  Negative    Cockroach  Negative    Mite mix  Negative     Food Adult Perc - 11/09/19 1500    Time Antigen Placed  1555    Allergen Manufacturer  Greer    Location  Back    Number of allergen test  72    1. Peanut  Negative    2. Soybean  Negative    3. Wheat  Negative    4. Sesame  Negative    5. Milk, cow  Negative    6. Egg White, Chicken  Negative    7. Casein  Negative    8. Shellfish Mix  Negative    9. Fish Mix  Negative    10. Cashew  Negative    11. [place]  Negative  [place]  Negative    13. Almond  Negative    14. Hazelnut  Negative    15. [place] nut  Negative    16. Coconut  Negative    17. Pistachio  Negative    18. Catfish  Negative    19. Bass  Negative    20. Trout  Negative    21. Tuna  Negative    22. Salmon  Negative    23. Flounder  Negative    24. Codfish  Negative    25. Shrimp  Negative    26. Crab  Negative    27. Lobster  Negative    28. Oyster  Negative    29. Scallops  Negative    30. Barley  Negative    31. Oat   --   +/-   32. Rye   Negative    33. Hops  Negative    34. Rice  Negative    35. Cottonseed  Negative    36. Saccharomyces Cerevisiae   Negative    37. Pork  Negative    38. [place] Meat   Negative    39. Chicken Meat  Negative    40. Beef  Negative    41. Lamb  Negative    42. Tomato  Negative    43. White Potato  Negative    44. Sweet Potato  Negative    45. Pea, Green/English  Negative    46. Navy Bean  Negative    47. Mushrooms  Negative    48. Avocado  Negative    49. Onion  Negative    50. Cabbage  Negative    51. Carrots  Negative    52. Celery  Negative    53. Corn  Negative    54. Cucumber  Negative    55. Grape (White seedless)  Negative    56. Orange   Negative    57. Banana  Negative    58. Apple  Negative    59. Peach  Negative    60. Strawberry  Negative    61. Cantaloupe  Negative    62. Watermelon  Negative    63. Pineapple  Negative    64. Chocolate/Cacao bean  Negative    65. Karaya Gum  Negative    66. Acacia (Arabic Gum)  Negative    67. Cinnamon  Negative    68. Nutmeg  Negative    69. Ginger  Negative    70. Garlic  Negative    71. Pepper, black  Negative    72. Mustard  Negative       Past Medical History: Patient Active Problem List   Diagnosis Date Noted  . Wheezing 11/09/2019  . Pruritus 11/09/2019  . Other allergic rhinitis 11/09/2019  . Neck pain 10/30/2019  . Vertigo 10/30/2019  . Allergic reaction 10/12/2019  . History of Crohn's disease 10/12/2019  . Vaginal bleeding 10/12/2019  . History of abortion- 05/18/2020  10/12/2019  . Body mass index 29.0-29.9, adult 10/12/2019  . History of cocaine use 10/12/2019  . Marijuana smoker 10/12/2019  . BPPV (benign paroxysmal positional vertigo), unspecified laterality -reported history  10/12/2019  . Chronic tension-type headache, intractable 10/12/2019  . History of genital warts 10/12/2019  . Adverse food reaction 10/12/2019  . Food allergic contact dermatitis 10/12/2019  . Facial swelling 10/12/2019  . Alcohol use 10/12/2019  . History of suicide attempt 10/12/2019  . Suicidal thoughts 10/12/2019  . Elevated BP without  diagnosis of hypertension 09/14/2019  . Shortness  of breath 08/28/2018  . Sensorineural hearing loss (SNHL) of both ears 05/05/2018  . Tinnitus of right ear 05/05/2018  . Breast pain in female 05/13/2013  . Crohn disease ([place]) 05/13/2013  . Cocaine substance abuse ([place]) 05/13/2013  . Tobacco abuse counseling 05/13/2013  . Routine health maintenance 05/13/2013  . Routine health maintenance 05/13/2013  . Difficulty swallowing 05/13/2013  . 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 Allergy   . Anemia   . Anxiety   . BPPV (benign paroxysmal positional vertigo)   . Chlamydia   . Crohn disease ([place])   . Depression    No specific treatment  . Eczema   . Esophagitis   . Gastritis   . Genital warts   . Gonorrhea   . Hearing loss   . Heart murmur   . Hiatal hernia   . Kidney stone   . PID (acute pelvic inflammatory disease)   . Pregnancy induced hypertension   . Preterm labor   . Substance abuse ([place])   . Urinary tract infection   . Urticaria    Past Surgical History: Past Surgical History:  Procedure Laterality Date  . abortion     x2   Medication List:  Current Outpatient Medications  Medication Sig Dispense Refill  . megestrol (MEGACE) 40 MG tablet Take one tablet by mouth twice a day until the bleeding stops.  If the bleeding has stopped, then go to once a day for 2 days and then stop taking the medication to taper it off.  If you start bleeding again, you can begin the cycle again. 45 tablet 1  . cetirizine (ZYRTEC ALLERGY) 10 MG tablet Take 1 tablet (10 mg total) by mouth daily. 30 tablet 5  . diphenhydrAMINE (BENADRYL) 25 mg capsule Take 1 capsule (25 mg total) by mouth every 6 (six) hours as needed. (Patient not taking: Reported on 11/09/2019) 30 capsule 0  . diphenoxylate-atropine (LOMOTIL) 2.5-0.025 MG tablet Take 1 tablet by mouth 4 (four) times daily as needed for diarrhea or loose stools. (Patient not taking: Reported on 11/09/2019) 12 tablet 0  . EPINEPHrine  0.3 mg/0.3 mL IJ SOAJ injection Inject 0.3 mLs (0.3 mg total) into the muscle as needed for anaphylaxis. (Patient not taking: Reported on 11/09/2019) 1 each 1  . fluticasone (FLONASE) 50 MCG/ACT nasal spray Place 1-2 sprays into both nostrils daily. 16 g 5  . Meclizine HCl 25 MG CHEW Chew by mouth.    . MECLIZINE HCL PO Take by mouth.    . Olopatadine HCl 0.2 % SOLN Apply 1 drop to eye daily as needed. 2.5 mL 5  . omeprazole (PRILOSEC) 10 MG capsule Take by mouth.    . ondansetron (ZOFRAN) 8 MG tablet Take 1 tablet (8 mg total) by mouth every 8 (eight) hours as needed for nausea or vomiting. (Patient not taking: Reported on 11/09/2019) 20 tablet 0  . pantoprazole (PROTONIX) 40 MG tablet Take 1 tablet (40 mg total) by mouth daily. (Patient not taking: Reported on 11/09/2019) 30 tablet 0   No current facility-administered medications for this visit.   Allergies: Allergies  Allergen Reactions  . Celery Oil Itching  . Codone [Hydrocodone] Itching  . Dilaudid [Hydromorphone Hcl] Itching  . Hydromorphone Hcl Itching  . Strawberry Extract Itching  . Tramadol Itching   Social History: Social History   Socioeconomic History  . Marital status: Single  Spouse name: Not on file  . Number of children: 2  . Years of education: Not on file  . Highest education level: Not on file  Occupational History  . Not on file  Tobacco Use  . Smoking status: Current Some Day Smoker    Packs/day: 0.25    Years: 6.00    Pack years: 1.50    Types: Cigarettes  . Smokeless tobacco: Never Used  Substance and Sexual Activity  . Alcohol use: Yes    Alcohol/week: 9.0 - 16.0 standard drinks    Types: 4 - 6 Cans of beer, 5 - 10 Shots of liquor per week  . Drug use: Yes    Types: Cocaine, Marijuana  . Sexual activity: Yes    Birth control/protection: None  Other Topics Concern  . Not on file  Social History Narrative   Lives two children. Recently took out 50-B on the FOB.   Social Determinants of Health    Financial Resource Strain: Low Risk   . Difficulty of Paying Living Expenses: Not hard at all  Food Insecurity: Unknown  . Worried About Charity fundraiser in the Last Year: Not on file  . Ran Out of Food in the Last Year: Never true  Transportation Needs: No Transportation Needs  . Lack of Transportation (Medical): No  . Lack of Transportation (Non-Medical): No  Physical Activity:   . Days of Exercise per Week:   . Minutes of Exercise per Session:   Stress: Stress Concern Present  . Feeling of Stress : Very much  Social Connections: Unknown  . Frequency of Communication with Friends and Family: Twice a week  . Frequency of Social Gatherings with Friends and Family: Once a week  . Attends Religious Services: Not on file  . Active Member of Clubs or Organizations: Not on file  . Attends Archivist Meetings: Not on file  . Marital Status: Not on file   Lives in a house. Smoking: denies Occupation: works at [employer] History: Environmental education officer in the house: no Charity fundraiser in the family room: yes Carpet in the bedroom: yes Heating: electric Cooling: central Pet: no  Family History: Family History  Problem Relation Age of Onset  . Asthma Mother   . Heart disease Mother   . Diabetes Mother   . Hypertension Mother   . Cancer Father        Breast  . Asthma Sister   . Depression Sister   . Diabetes Sister   . Hypertension Sister   . Diabetes Other   . Anesthesia problems Neg Hx    Review of Systems  Constitutional: Negative for appetite change, fever and unexpected weight change.  HENT: Positive for congestion and rhinorrhea.   Eyes: Positive for itching.  Respiratory: Positive for wheezing. Negative for cough, chest tightness and shortness of breath.   Cardiovascular: Negative for chest pain.  Gastrointestinal: Negative for abdominal pain.  Genitourinary: Negative for difficulty urinating.  Skin: Positive for rash.  Allergic/Immunologic:  Positive for environmental allergies.   Objective: BP 134/72 (BP Location: Left Arm, Patient Position: Sitting, Cuff Size: Normal)   Pulse 84   Temp 97.9 F (36.6 C) (Temporal)   Resp 18   Ht 5' 4"  (1.626 m)   Wt 159 lb (72.1 kg)   SpO2 99%   BMI 27.29 kg/m  Body mass index is 27.29 kg/m. Physical Exam  Constitutional: She is oriented to person, place, and time. She appears well-developed and well-nourished.  HENT:  Head: Normocephalic and atraumatic.  Right Ear: External ear normal.  Left Ear: External ear normal.  Nose: Nose normal.  Mouth/Throat: Oropharynx is clear and moist.  Eyes: Conjunctivae and EOM are normal.  Cardiovascular: Normal rate, regular rhythm and normal heart sounds. Exam reveals no gallop and no friction rub.  No murmur heard. Pulmonary/Chest: Effort normal and breath sounds normal. She has no wheezes. She has no rales.  Abdominal: Soft.  Musculoskeletal:     Cervical back: Neck supple.  Neurological: She is alert and oriented to person, place, and time.  Skin: Skin is warm. No rash noted.  Psychiatric: She has a normal mood and affect. Her behavior is normal.  Nursing note and vitals reviewed.  The plan was reviewed with the patient/family, and all questions/concerned were addressed.  It was my pleasure to see Maryrose today and participate in her care. Please feel free to contact me with any questions or concerns.  Sincerely,  Rexene Alberts, DO Allergy & Immunology  [facility] office: [phone] [facility] office: [place] office: [phone]

## 2019-11-09 ENCOUNTER — Other Ambulatory Visit: Payer: Self-pay

## 2019-11-09 ENCOUNTER — Encounter: Payer: Self-pay | Admitting: Allergy

## 2019-11-09 ENCOUNTER — Ambulatory Visit (INDEPENDENT_AMBULATORY_CARE_PROVIDER_SITE_OTHER): Payer: Medicaid Other | Admitting: Allergy

## 2019-11-09 VITALS — BP 134/72 | HR 84 | Temp 97.9°F | Resp 18 | Ht 64.0 in | Wt 159.0 lb

## 2019-11-09 DIAGNOSIS — T781XXD Other adverse food reactions, not elsewhere classified, subsequent encounter: Secondary | ICD-10-CM | POA: Diagnosis not present

## 2019-11-09 DIAGNOSIS — R062 Wheezing: Secondary | ICD-10-CM | POA: Diagnosis not present

## 2019-11-09 DIAGNOSIS — L299 Pruritus, unspecified: Secondary | ICD-10-CM

## 2019-11-09 DIAGNOSIS — J3089 Other allergic rhinitis: Secondary | ICD-10-CM | POA: Diagnosis not present

## 2019-11-09 MED ORDER — FLUTICASONE PROPIONATE 50 MCG/ACT NA SUSP: 1.0000 | Freq: Every day | NASAL | 5 refills | Status: DC

## 2019-11-09 MED ORDER — OLOPATADINE HCL 0.2 % OP SOLN: 1.0000 [drp] | Freq: Every day | OPHTHALMIC | 5 refills | Status: DC | PRN

## 2019-11-09 MED ORDER — CETIRIZINE HCL 10 MG PO TABS: 10.0000 mg | ORAL_TABLET | Freq: Every day | ORAL | 5 refills | Status: DC

## 2019-11-09 NOTE — Assessment & Plan Note (Signed)
Patient concerned about environmental and food allergies as she has been experiencing pruritus and rashes 20 to 30 minutes after eating.  Sometimes she can eat the same food with no issues.  Noted that strawberries, seafood, shellfish and certain spices seem to trigger this more.  No previous allergy evaluation.  Today's skin testing showed: Positive to grass, mold, cat. Borderline to oats.  Given these test results, not sure what exactly is triggering her symptoms.   Start taking zyrtec 49m daily.  Keep a food journal with your symptoms.   Start proper skin care as below. Get bloodwork to rule out other etiologies.   Avoid foods that seem to bother you - strawberry, seafood, shellfish.  For mild symptoms you can take over the counter antihistamines such as Benadryl and monitor symptoms closely. If symptoms worsen or if you have severe symptoms including breathing issues, throat closure, significant swelling, whole body hives, severe diarrhea and vomiting, lightheadedness then inject epinephrine and seek immediate medical care afterwards.  Food action plan given.

## 2019-11-09 NOTE — Patient Instructions (Addendum)
Today's skin testing showed:  Positive to grass, mold, cat.   Borderline to oats.  Environmental allergies:  Start environmental allergy control measures as below.  Start zyrtec 33m daily.  May use Flonase 1 spray per nostril daily as needed for sinus symptoms.  May use olopatadine eye drops 0.2% once a day as needed for itchy/watery eyes.  Today's breathing test showed:  Normal.   Monitor breathing/wheezing.   Itching:  Start taking zyrtec 153mdaily.  Keep a food journal with your symptoms.   Start proper skin care as below. Get bloodwork:  We are ordering labs, so please allow 1-2 weeks for the results to come back. With the newly implemented Cures Act, the labs might be visible to you at the same time that they become visible to me. However, I will not address the results until all of the results are back, so please be patient.  In the meantime, continue recommendations in your patient instructions, including avoidance measures (if applicable), until you hear from me.  Avoid foods that seem to bother you - strawberry, seafood, shellfish.  For mild symptoms you can take over the counter antihistamines such as Benadryl and monitor symptoms closely. If symptoms worsen or if you have severe symptoms including breathing issues, throat closure, significant swelling, whole body hives, severe diarrhea and vomiting, lightheadedness then inject epinephrine and seek immediate medical care afterwards.  Food action plan given.   Follow up in 3 months or sooner if needed.  Reducing Pollen Exposure . Pollen seasons: trees (spring), grass (summer) and ragweed/weeds (fall). . Marland Kitcheneep windows closed in your home and car to lower pollen exposure.  . Susa Simmondsir conditioning in the bedroom and throughout the house if possible.  . Avoid going out in dry windy days - especially early morning. . Pollen counts are highest between 5 - 10 AM and on dry, hot and windy days.  . Save outside  activities for late afternoon or after a heavy rain, when pollen levels are lower.  . Avoid mowing of grass if you have grass pollen allergy. . Marland Kitchene aware that pollen can also be transported indoors on people and pets.  . Dry your clothes in an automatic dryer rather than hanging them outside where they might collect pollen.  . Rinse hair and eyes before bedtime. Pet Allergen Avoidance: . Contrary to popular opinion, there are no "hypoallergenic" breeds of dogs or cats. That is because people are not allergic to an animal's hair, but to an allergen found in the animal's saliva, dander (dead skin flakes) or urine. Pet allergy symptoms typically occur within minutes. For some people, symptoms can build up and become most severe 8 to 12 hours after contact with the animal. People with severe allergies can experience reactions in public places if dander has been transported on the pet owners' clothing. . Marland Kitcheneeping an animal outdoors is only a partial solution, since homes with pets in the yard still have higher concentrations of animal allergens. . Before getting a pet, ask your allergist to determine if you are allergic to animals. If your pet is already considered part of your family, try to minimize contact and keep the pet out of the bedroom and other rooms where you spend a great deal of time. . As with dust mites, vacuum carpets often or replace carpet with a hardwood floor, tile or linoleum. . High-efficiency particulate air (HEPA) cleaners can reduce allergen levels over time. . While dander and saliva are the source of cat  and dog allergens, urine is the source of allergens from rabbits, hamsters, mice and [place] pigs; so ask a non-allergic family member to clean the animal's cage. . If you have a pet allergy, talk to your allergist about the potential for allergy immunotherapy (allergy shots). This strategy can often provide long-term relief. Mold Control . Mold and fungi can grow on a variety of  surfaces provided certain temperature and moisture conditions exist.  . Outdoor molds grow on plants, decaying vegetation and soil. The major outdoor mold, Alternaria and Cladosporium, are found in very high numbers during hot and dry conditions. Generally, a late summer - fall peak is seen for common outdoor fungal spores. Rain will temporarily lower outdoor mold spore count, but counts rise rapidly when the rainy period ends. . The most important indoor molds are Aspergillus and Penicillium. Dark, humid and poorly ventilated basements are ideal sites for mold growth. The next most common sites of mold growth are the bathroom and the kitchen. Outdoor (Seasonal) Mold Control . Use air conditioning and keep windows closed. . Avoid exposure to decaying vegetation. Marland Kitchen Avoid leaf raking. . Avoid grain handling. . Consider wearing a face mask if working in moldy areas.  Indoor (Perennial) Mold Control  . Maintain humidity below 50%. . Get rid of mold growth on hard surfaces with water, detergent and, if necessary, 5% bleach (do not mix with other cleaners). Then dry the area completely. If mold covers an area more than 10 square feet, consider hiring an indoor environmental professional. . For clothing, washing with soap and water is best. If moldy items cannot be cleaned and dried, throw them away. . Remove sources e.g. contaminated carpets. . Repair and seal leaking roofs or pipes. Using dehumidifiers in damp basements may be helpful, but empty the water and clean units regularly to prevent mildew from forming. All rooms, especially basements, bathrooms and kitchens, require ventilation and cleaning to deter mold and mildew growth. Avoid carpeting on concrete or damp floors, and storing items in damp areas.  Skin care recommendations  Bath time: . Always use lukewarm water. AVOID very hot or cold water. Marland Kitchen Keep bathing time to 5-10 minutes. . Do NOT use bubble bath. . Use a mild soap and use just  enough to wash the dirty areas. . Do NOT scrub skin vigorously.  . After bathing, pat dry your skin with a towel. Do NOT rub or scrub the skin.  Moisturizers and prescriptions:  . ALWAYS apply moisturizers immediately after bathing (within 3 minutes). This helps to lock-in moisture. . Use the moisturizer several times a day over the whole body. Kermit Balo summer moisturizers include: Aveeno, CeraVe, Cetaphil. Kermit Balo winter moisturizers include: Aquaphor, Vaseline, Cerave, Cetaphil, Eucerin, Vanicream. . When using moisturizers along with medications, the moisturizer should be applied about one hour after applying the medication to prevent diluting effect of the medication or moisturize around where you applied the medications. When not using medications, the moisturizer can be continued twice daily as maintenance.  Laundry and clothing: . Avoid laundry products with added color or perfumes. . Use unscented hypo-allergenic laundry products such as Tide free, Cheer free & gentle, and All free and clear.  . If the skin still seems dry or sensitive, you can try double-rinsing the clothes. . Avoid tight or scratchy clothing such as wool. . Do not use fabric softeners or dyer sheets.

## 2019-11-09 NOTE — Assessment & Plan Note (Signed)
See assessment and plan as above.  Today's skin testing showed: Positive to grass, mold, cat. Borderline to oats.  Given these test results, not sure what exactly is triggering her symptoms.   Start taking zyrtec 49m daily.  Keep a food journal with your symptoms.   Avoid foods that seem to bother you - strawberry, seafood, shellfish.  For mild symptoms you can take over the counter antihistamines such as Benadryl and monitor symptoms closely. If symptoms worsen or if you have severe symptoms including breathing issues, throat closure, significant swelling, whole body hives, severe diarrhea and vomiting, lightheadedness then inject epinephrine and seek immediate medical care afterwards.  Food action plan given.

## 2019-11-09 NOTE — Assessment & Plan Note (Signed)
Rhinoconjunctivitis symptoms the last few weeks.  Does not take medications for this and no previous allergy testing.  Today's skin testing showed: Positive to grass, mold, cat.   Start environmental allergy control measures.   Start zyrtec 85m daily.  May use Flonase 1 spray per nostril daily as needed for sinus symptoms.  May use olopatadine eye drops 0.2% once a day as needed for itchy/watery eyes.

## 2019-11-09 NOTE — Assessment & Plan Note (Signed)
No history of asthma but noticed wheezing at times. Not sure if it's coming from chest or her nose.  Today's spirometry was normal.  Monitor symptoms. If still having issues then will prescribe albuterol next.

## 2019-11-13 LAB — COMPREHENSIVE METABOLIC PANEL
ALT: 14 IU/L (ref 0–32)
AST: 18 IU/L (ref 0–40)
Albumin/Globulin Ratio: 1.6 (ref 1.2–2.2)
Albumin: 4.4 g/dL (ref 3.8–4.8)
Alkaline Phosphatase: 59 IU/L (ref 39–117)
BUN/Creatinine Ratio: 15 (ref 9–23)
BUN: 13 mg/dL (ref 6–20)
Bilirubin Total: 0.3 mg/dL (ref 0.0–1.2)
CO2: 25 mmol/L (ref 20–29)
Calcium: 9.8 mg/dL (ref 8.7–10.2)
Chloride: 104 mmol/L (ref 96–106)
Creatinine, Ser: 0.88 mg/dL (ref 0.57–1.00)
GFR calc Af Amer: 100 mL/min/{1.73_m2} (ref >59)
GFR calc non Af Amer: 87 mL/min/{1.73_m2} (ref >59)
Globulin, Total: 2.8 g/dL (ref 1.5–4.5)
Glucose: 103 mg/dL — ABNORMAL HIGH (ref 65–99)
Potassium: 4.3 mmol/L (ref 3.5–5.2)
Sodium: 141 mmol/L (ref 134–144)
Total Protein: 7.2 g/dL (ref 6.0–8.5)

## 2019-11-13 LAB — CBC WITH DIFFERENTIAL/PLATELET
Basophils Absolute: 0 10*3/uL (ref 0.0–0.2)
Basos: 1 %
EOS (ABSOLUTE): 0 10*3/uL (ref 0.0–0.4)
Eos: 0 %
Hematocrit: 40.4 % (ref 34.0–46.6)
Hemoglobin: 12.9 g/dL (ref 11.1–15.9)
Immature Grans (Abs): 0 10*3/uL (ref 0.0–0.1)
Immature Granulocytes: 0 %
Lymphocytes Absolute: 1.6 10*3/uL (ref 0.7–3.1)
Lymphs: 29 %
MCH: 25.8 pg — ABNORMAL LOW (ref 26.6–33.0)
MCHC: 31.9 g/dL (ref 31.5–35.7)
MCV: 81 fL (ref 79–97)
Monocytes Absolute: 0.3 10*3/uL (ref 0.1–0.9)
Monocytes: 5 %
Neutrophils Absolute: 3.5 10*3/uL (ref 1.4–7.0)
Neutrophils: 65 %
Platelets: 316 10*3/uL (ref 150–450)
RBC: 5 x10E6/uL (ref 3.77–5.28)
RDW: 15 % (ref 11.7–15.4)
WBC: 5.4 10*3/uL (ref 3.4–10.8)

## 2019-11-13 LAB — ALPHA-GAL PANEL
Alpha Gal IgE*: <0.1 kU/L (ref <0.10)
Beef (Bos spp) IgE: <0.1 kU/L (ref <0.35)
Class Interpretation: 0
Class Interpretation: 0
Class Interpretation: 0
Lamb/Mutton (Ovis spp) IgE: <0.1 kU/L (ref <0.35)
Pork (Sus spp) IgE: <0.1 kU/L (ref <0.35)

## 2019-11-13 LAB — IGE FOOD PROF W/COMPONENT RFLX
Allergen Corn, IgE: <0.1 kU/L
Clam IgE: <0.1 kU/L
Codfish IgE: <0.1 kU/L
F001-IgE Egg White: <0.1 kU/L
F002-IgE Milk: <0.1 kU/L
Peanut, IgE: <0.1 kU/L
Scallop IgE: <0.1 kU/L
Sesame Seed IgE: <0.1 kU/L
Shrimp IgE: <0.1 kU/L
Soybean IgE: <0.1 kU/L
Walnut IgE: <0.1 kU/L
Wheat IgE: <0.1 kU/L

## 2019-11-13 LAB — TRYPTASE: Tryptase: 2.5 ug/L (ref 2.2–13.2)

## 2019-11-13 LAB — THYROID CASCADE PROFILE: TSH: 1.32 u[IU]/mL (ref 0.450–4.500)

## 2019-11-13 LAB — CHRONIC URTICARIA: cu index: 1.9 (ref <10)

## 2019-11-13 LAB — ANA W/REFLEX: Anti Nuclear Antibody (ANA): NEGATIVE

## 2019-11-13 LAB — ALLERGEN, STRAWBERRY, F44: Allergen Strawberry IgE: <0.1 kU/L

## 2019-11-17 ENCOUNTER — Ambulatory Visit (INDEPENDENT_AMBULATORY_CARE_PROVIDER_SITE_OTHER): Payer: Medicaid Other | Admitting: Adult Health

## 2019-11-17 ENCOUNTER — Other Ambulatory Visit: Payer: Self-pay

## 2019-11-17 ENCOUNTER — Ambulatory Visit: Payer: Self-pay | Admitting: *Deleted

## 2019-11-17 ENCOUNTER — Encounter: Payer: Self-pay | Admitting: Adult Health

## 2019-11-17 DIAGNOSIS — R059 Cough, unspecified: Secondary | ICD-10-CM

## 2019-11-17 DIAGNOSIS — R05 Cough: Secondary | ICD-10-CM | POA: Diagnosis not present

## 2019-11-17 DIAGNOSIS — R220 Localized swelling, mass and lump, head: Secondary | ICD-10-CM

## 2019-11-17 DIAGNOSIS — M7989 Other specified soft tissue disorders: Secondary | ICD-10-CM | POA: Diagnosis not present

## 2019-11-17 NOTE — Telephone Encounter (Signed)
Pt called in c/o feeling really bad.   She and her boyfriend just returned from the beach.  She is c/o a headache, muscle aches, chills and sweating.   Nausea, dizziness and weakness.  She is also c/o hands, feet and face being swollen.   She  Has noticed a change in her taste and smell and is coughing.  She does not have a BP cuff or thermometer.   She said her boyfriend feels fine.   They went with a group of people.  I instructed her to stay quarantined along with her family (boyfriend and kids) until she has been tested for COVID-19 as she is having many of the symptoms.  She was agreeable to quarantining.    I made her a virtual visit for today with Laverna Peace, FNP at 3:20.  COVID-19 screening questionnaire completed indicating a virtual visit.  I let her know she could take some Tylenol or ibuprofen for the headache and muscle aches and for if she possibly has a fever.  She does have Tylenol when I asked her.   I sent my notes to Laverna Peace, FNP. Reason for Disposition . [1] MODERATE headache (e.g., interferes with normal activities) AND [2] present > 24 hours AND [3] unexplained  (Exceptions: analgesics not tried, typical migraine, or headache part of viral illness)  Answer Assessment - Initial Assessment Questions 1. LOCATION: "Where does it hurt?"      I've been feeling bad for 3 days.    My hands, feet and face are swollen.   I feel like I could pass out.   I have a headache.  I'm feeling dizzy too.  I have to hold onto things to walk. I have high BP but not taking anything.   I also have vertigo.   I just started seeing you guys recently.   2. ONSET: "When did the headache start?" (Minutes, hours or days)      3 days ago.   Having nausea. 3. PATTERN: "Does the pain come and go, or has it been constant since it started?"     Coming and going.  I have a runny nose.   I'm getting chills and sweaty.  I don't have a thermometer.   I don't have a BP cuff either. 4. SEVERITY:  "How bad is the pain?" and "What does it keep you from doing?"  (e.g., Scale 1-10; mild, moderate, or severe)   - MILD (1-3): doesn't interfere with normal activities    - MODERATE (4-7): interferes with normal activities or awakens from sleep    - SEVERE (8-10): excruciating pain, unable to do any normal activities        7 on pain scale.    I'm taking Tylenol and went to sleep but I wake up still feeling bad. I have been at the beach.   I'm having muscle aches. 5. RECURRENT SYMPTOM: "Have you ever had headaches before?" If so, ask: "When was the last time?" and "What happened that time?"      No    6. CAUSE: "What do you think is causing the headache?"     I thought my BP.   7. MIGRAINE: "Have you been diagnosed with migraine headaches?" If so, ask: "Is this headache similar?"      No 8. HEAD INJURY: "Has there been any recent injury to the head?"      No 9. OTHER SYMPTOMS: "Do you have any other symptoms?" (fever, stiff neck, eye pain, sore  throat, cold symptoms)     Muscle aches, sweating, dizzy, my taste and smell are different.    I have a headache.   I went with a group of people to the beach. 10. PREGNANCY: "Is there any chance you are pregnant?" "When was your last menstrual period?"       No.   I'm on birth control.  Protocols used: HEADACHE-A-AH

## 2019-11-17 NOTE — Progress Notes (Signed)
MyChart Video Visit    Virtual Visit via Video Note   This visit type was conducted due to national recommendations for restrictions regarding the COVID-19 Pandemic (e.g. social distancing) in an effort to limit this patient's exposure and mitigate transmission in our community. This patient is at least at moderate risk for complications without adequate follow up. This format is felt to be most appropriate for this patient at this time. Physical exam was limited by quality of the video and audio technology used for the visit.   Patient location: at home Provider location: Provider: Provider's office at  [facility], [place].      Patient: Kristen Chung   DOB: 06-22-1986   34 y.o. Female  MRN: [id number] Visit Date: 11/17/2019  Today's healthcare provider: [name], FNP   No chief complaint on file.  Subjective    HPI Patient was at the beach and came home and reports she is having some hand and feet swelling. She reports her face is swollen and her lips feel bigger for the last three days.  She is allergic to grass, pollen, mold and did see allergist recently. Zyrtec was added. She denies any throat swelling or difficulty swallowing. She has a epi pen.  She has not had to use. She reports she took tylenol the other night. She denies any difficulty swallowing or breathing.  She also has had cough and congestion x 1 day.  She denies any known exposure. She is unsure if she has a fever.  She was scheduled by [facility] for a virtual visit this afternoon.  Denies any new medication.Denies rash. She has muscle pain. She has body aches.   She has been feeling like she could " pass out" she feels very weak and this has gotten worse.  She denies any drug use.    No LMP recorded. Implanon. Denies any pregnancy.  Denies any syncope.  Patient  denies any fever, body aches,chills, rash, chest pain, shortness of breath, vomiting, or diarrhea.     Allergies   Allergen Reactions  . Celery Oil Itching  . Codone [Hydrocodone] Itching  . Dilaudid [Hydromorphone Hcl] Itching  . Hydromorphone Hcl Itching  . Strawberry Extract Itching  . Tramadol Itching     Patient Active Problem List   Diagnosis Date Noted  . Wheezing 11/09/2019  . Pruritus 11/09/2019  . Other allergic rhinitis 11/09/2019  . Neck pain 10/30/2019  . Vertigo 10/30/2019  . Allergic reaction 10/12/2019  . History of Crohn's disease 10/12/2019  . Vaginal bleeding 10/12/2019  . History of abortion- 05/18/2020  10/12/2019  . Body mass index 29.0-29.9, adult 10/12/2019  . History of cocaine use 10/12/2019  . Marijuana smoker 10/12/2019  . BPPV (benign paroxysmal positional vertigo), unspecified laterality -reported history  10/12/2019  . Chronic tension-type headache, intractable 10/12/2019  . History of genital warts 10/12/2019  . Adverse food reaction 10/12/2019  . Food allergic contact dermatitis 10/12/2019  . Facial swelling 10/12/2019  . Alcohol use 10/12/2019  . History of suicide attempt 10/12/2019  . Suicidal thoughts 10/12/2019  . Elevated BP without diagnosis of hypertension 09/14/2019  . Shortness of breath 08/28/2018  . Sensorineural hearing loss (SNHL) of both ears 05/05/2018  . Tinnitus of right ear 05/05/2018  . Breast pain in female 05/13/2013  . Crohn disease ([place]) 05/13/2013  . Cocaine substance abuse ([place]) 05/13/2013  . Tobacco abuse counseling 05/13/2013  . Routine health maintenance 05/13/2013  . Routine health maintenance 05/13/2013  .  Difficulty swallowing 05/13/2013  . 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 Allergy   . Anemia   . Anxiety   . BPPV (benign paroxysmal positional vertigo)   . Chlamydia   . Crohn disease ([place])   . Depression    No specific treatment  . Eczema   . Esophagitis   . Gastritis   . Genital warts   . Gonorrhea   . Hearing loss   . Heart murmur   .  Hiatal hernia   . Kidney stone   . PID (acute pelvic inflammatory disease)   . Pregnancy induced hypertension   . Preterm labor   . Substance abuse ([place])   . Urinary tract infection   . Urticaria    Past Surgical History:  Procedure Laterality Date  . abortion     x2   Social History   Tobacco Use  . Smoking status: Current Some Day Smoker    Packs/day: 0.25    Years: 6.00    Pack years: 1.50    Types: Cigarettes  . Smokeless tobacco: Never Used  Substance Use Topics  . Alcohol use: Yes    Alcohol/week: 9.0 - 16.0 standard drinks    Types: 4 - 6 Cans of beer, 5 - 10 Shots of liquor per week  . Drug use: Yes    Types: Cocaine, Marijuana   Family Status  Relation Name Status  . Mother  (Not Specified)  . Father  (Not Specified)  . Sister  (Not Specified)  . Other Grandparents (Not Specified)  . Neg Hx  (Not Specified)   Allergies  Allergen Reactions  . Celery Oil Itching  . Codone [Hydrocodone] Itching  . Dilaudid [Hydromorphone Hcl] Itching  . Hydromorphone Hcl Itching  . Strawberry Extract Itching  . Tramadol Itching      Medications: Outpatient Medications Prior to Visit  Medication Sig  . cetirizine (ZYRTEC ALLERGY) 10 MG tablet Take 1 tablet (10 mg total) by mouth daily.  . diphenhydrAMINE (BENADRYL) 25 mg capsule Take 1 capsule (25 mg total) by mouth every 6 (six) hours as needed. (Patient not taking: Reported on 11/09/2019)  . diphenoxylate-atropine (LOMOTIL) 2.5-0.025 MG tablet Take 1 tablet by mouth 4 (four) times daily as needed for diarrhea or loose stools. (Patient not taking: Reported on 11/09/2019)  . EPINEPHrine 0.3 mg/0.3 mL IJ SOAJ injection Inject 0.3 mLs (0.3 mg total) into the muscle as needed for anaphylaxis. (Patient not taking: Reported on 11/09/2019)  . fluticasone (FLONASE) 50 MCG/ACT nasal spray Place 1-2 sprays into both nostrils daily.  . Meclizine HCl 25 MG CHEW Chew by mouth.  . MECLIZINE HCL PO Take by mouth.  . megestrol (MEGACE)  40 MG tablet Take one tablet by mouth twice a day until the bleeding stops.  If the bleeding has stopped, then go to once a day for 2 days and then stop taking the medication to taper it off.  If you start bleeding again, you can begin the cycle again.  Marland Kitchen Olopatadine HCl 0.2 % SOLN Apply 1 drop to eye daily as needed.  Marland Kitchen omeprazole (PRILOSEC) 10 MG capsule Take by mouth.  . ondansetron (ZOFRAN) 8 MG tablet Take 1 tablet (8 mg total) by mouth every 8 (eight) hours as needed for nausea or vomiting. (Patient not taking: Reported on 11/09/2019)  . pantoprazole (PROTONIX) 40 MG tablet Take 1 tablet (40 mg total) by mouth daily. (Patient not taking: Reported on  11/09/2019)   No facility-administered medications prior to visit.    Last CBC Lab Results  Component Value Date   WBC 5.4 11/09/2019   HGB 12.9 11/09/2019   HCT 40.4 11/09/2019   MCV 81 11/09/2019   MCH 25.8 (L) 11/09/2019   RDW 15.0 11/09/2019   PLT 316 62/94/7654   Last metabolic panel Lab Results  Component Value Date   GLUCOSE 103 (H) 11/09/2019   NA 141 11/09/2019   K 4.3 11/09/2019   CL 104 11/09/2019   CO2 25 11/09/2019   BUN 13 11/09/2019   CREATININE 0.88 11/09/2019   GFRNONAA 87 11/09/2019   GFRAA 100 11/09/2019   CALCIUM 9.8 11/09/2019   PHOS 3.7 04/05/2010   PROT 7.2 11/09/2019   ALBUMIN 4.4 11/09/2019   LABGLOB 2.8 11/09/2019   AGRATIO 1.6 11/09/2019   BILITOT 0.3 11/09/2019   ALKPHOS 59 11/09/2019   AST 18 11/09/2019   ALT 14 11/09/2019   ANIONGAP 10 10/17/2019   Last hemoglobin A1c Lab Results  Component Value Date   HGBA1C  04/02/2010    5.2 (NOTE)                                                                       According to the ADA Clinical Practice Recommendations for 2011, when HbA1c is used as a screening test:   >=6.5%   Diagnostic of Diabetes Mellitus           (if abnormal result  is confirmed)  5.7-6.4%   Increased risk of developing Diabetes Mellitus  References:Diagnosis and  Classification of Diabetes Mellitus,Diabetes [id number],5465,68(LEXNT 1):S62-S69 and Standards of Medical Care in         Diabetes - 2011,Diabetes Care,2011,34  (Suppl 1):S11-S61.   Last vitamin D No results found for: 25OHVITD2, 25OHVITD3, VD25OH    Review of Systems  Constitutional: Positive for fatigue. Negative for fever.  HENT: Positive for congestion and postnasal drip.   Respiratory: Positive for cough (x 1 day ). Negative for apnea, choking, chest tightness, shortness of breath, wheezing and stridor.   Cardiovascular: Negative.  Negative for chest pain, palpitations and leg swelling.  Musculoskeletal: Positive for arthralgias and myalgias. Negative for gait problem.  Skin: Negative for rash.  Allergic/Immunologic: Positive for environmental allergies.       Facial swelling, lip swelling, hands and feet swollen x 3 days   Neurological: Positive for dizziness, weakness and light-headedness.      Objective    There were no vitals taken for this visit. BP Readings from Last 3 Encounters:  11/09/19 134/72  10/17/19 (!) 139/104  10/09/19 124/80    No vital signs available.   Physical Exam   No video capabilities on patients end, this was a telephone call only.    Patient is alert and oriented and responsive to questions Engages in conversation with provider. Speaks in full sentences without any pauses without any shortness of breath or distress.     Assessment & Plan    Facial swelling  Swelling of both hands  Bilateral swelling of feet  Cough  Swelling of both lips  Discussed anaphylaxis and given her history of allergies and recent allergy testing she needs in person evaluation.   Advised emergency room  now or [place] urgent care for in person evaluation, she is aware she can take Benadryl 50 mg now, she will not self drive. She has an Epi- pen and is aware how to use it. Call 911 if any symptoms worsening.  Go now for evaluation.   Advised patient call the office  or your primary care doctor for an appointment if no improvement within 72 hours or if any symptoms change or worsen at any time  Advised ER or urgent Care if after hours or on weekend. Call 911 for emergency symptoms at any time.Patinet verbalized understanding of all instructions given/reviewed and treatment plan and has no further questions or concerns at this time.    I discussed the assessment and treatment plan with the patient. The patient was provided an opportunity to ask questions and all were answered. The patient agreed with the plan and demonstrated an understanding of the instructions.   The patient was advised to call back or seek an in-person evaluation if the symptoms worsen or if the condition fails to improve as anticipated.  I provided 15  minutes of non-face-to-face time during this encounter.  IWellington Hampshire Latangela Mccomas, FNP, have reviewed all documentation for this visit. The documentation on 11/17/19 for the exam, diagnosis, procedures, and orders are all accurate and complete.  [name], [place] [phone] (phone) [phone] (fax)  [place]

## 2019-11-17 NOTE — Patient Instructions (Signed)
Cough, Adult A cough helps to clear your throat and lungs. A cough may be a sign of an illness or another medical condition. An acute cough may only last 2-3 weeks, while a chronic cough may last 8 or more weeks. Many things can cause a cough. They include:  Germs (viruses or bacteria) that attack the airway.  Breathing in things that bother (irritate) your lungs.  Allergies.  Asthma.  Mucus that runs down the back of your throat (postnasal drip).  Smoking.  Acid backing up from the stomach into the tube that moves food from the mouth to the stomach (gastroesophageal reflux).  Some medicines.  Lung problems.  Other medical conditions, such as heart failure or a blood clot in the lung (pulmonary embolism). Follow these instructions at home: Medicines  Take over-the-counter and prescription medicines only as told by your doctor.  Talk with your doctor before you take medicines that stop a cough (coughsuppressants). Lifestyle   Do not smoke, and try not to be around smoke. Do not use any products that contain nicotine or tobacco, such as cigarettes, e-cigarettes, and chewing tobacco. If you need help quitting, ask your doctor.  Drink enough fluid to keep your pee (urine) pale yellow.  Avoid caffeine.  Do not drink alcohol if your doctor tells you not to drink. General instructions   Watch for any changes in your cough. Tell your doctor about them.  Always cover your mouth when you cough.  Stay away from things that make you cough, such as perfume, candles, campfire smoke, or cleaning products.  If the air is dry, use a cool mist vaporizer or humidifier in your home.  If your cough is worse at night, try using extra pillows to raise your head up higher while you sleep.  Rest as needed.  Keep all follow-up visits as told by your doctor. This is important. Contact a doctor if:  You have new symptoms.  You cough up pus.  Your cough does not get better after 2-3  weeks, or your cough gets worse.  Cough medicine does not help your cough and you are not sleeping well.  You have pain that gets worse or pain that is not helped with medicine.  You have a fever.  You are losing weight and you do not know why.  You have night sweats. Get help right away if:  You cough up blood.  You have trouble breathing.  Your heartbeat is very fast. These symptoms may be an emergency. Do not wait to see if the symptoms will go away. Get medical help right away. Call your local emergency services (911 in the [place] Do not drive yourself to the hospital. Summary  A cough helps to clear your throat and lungs. Many things can cause a cough.  Take over-the-counter and prescription medicines only as told by your doctor.  Always cover your mouth when you cough.  Contact a doctor if you have new symptoms or you have a cough that does not get better or gets worse. This information is not intended to replace advice given to you by your health care provider. Make sure you discuss any questions you have with your health care provider. Document Revised: 07/28/2018 Document Reviewed: 07/28/2018 Elsevier Patient Education  [place]. Anaphylactic Reaction, Adult An anaphylactic reaction (anaphylaxis) is a sudden, severe allergic reaction by the body's disease-fighting system (immune system). Anaphylaxis can be life-threatening. This condition must be treated right away. Sometimes a person may need to  be treated in the hospital. What are the causes? This condition is caused by exposure to a substance that you are allergic to (allergen). In response to this exposure, the body releases proteins (antibodies) and other compounds, such as histamine, into the bloodstream. This causes swelling in certain tissues and loss of blood pressure to important areas, such as the heart and lungs. Common allergens that can cause anaphylaxis include:  Foods, especially peanuts, wheat,  shellfish, milk, and eggs.  Medicines.  Insect bites or stings.  Blood or parts of blood received for treatment (transfusions).  Chemicals, such as dyes, latex, and contrast material that is used for medical tests. What increases the risk? This condition is more likely to occur in people who:  Have allergies.  Have had anaphylaxis before.  Have a family history of anaphylaxis.  Have certain medical conditions, including asthma and eczema. What are the signs or symptoms? Symptoms of anaphylaxis may include:  Feeling warm in the face (flushed). This may include redness.  Itchy, red, swollen areas of skin (hives).  Swelling of the eyes, lips, face, mouth, tongue, or throat.  Difficulty breathing, speaking, or swallowing.  Noisy breathing (wheezing).  Dizziness or light-headedness.  Fainting.  Pain or cramping in the abdomen.  Vomiting.  Diarrhea. How is this diagnosed? This condition is diagnosed based on:  Your symptoms.  A physical exam.  Blood tests.  Recent history of exposure to allergens. How is this treated? If you think you are having an anaphylactic reaction, you should do the following right away:  Give yourself an epinephrine injection using what is commonly called an auto-injector "pen" (pre-filled automatic epinephrine injection device). Your health care provider will teach you how to use an auto-injector pen.  Call for emergency help. If you use a pen, you must still get emergency medical treatment in the hospital. Treatment in the hospital may include: ? Medicines to help:  Tighten your blood vessels (epinephrine).  Relieve itching and hives (antihistamines).  Reduce swelling (corticosteroids). ? Oxygen therapy to help you breathe. ? IV fluids to keep you hydrated. Follow these instructions at home: Safety  Always keep an auto-injector pen near you. This can be lifesaving if you have a severe anaphylactic reaction. Use your  auto-injector pen as told by your health care provider.  Do not drive after an anaphylactic reaction until your health care provider approves.  Make sure that you, the members of your household, and your employer know: ? What you are allergic to, so it can be avoided. ? How to use an auto-injector pen to give you an epinephrine injection.  Replace your epinephrine immediately after you use your auto-injector pen. This is important if you have another reaction.  If told by your health care provider, wear a medical alert bracelet or necklace that states your allergy.  Learn the signs of anaphylaxis so that you can recognize and treat it right away.  Work with your health care providers to make an anaphylaxis plan. Preparation is important. General instructions  If you have hives or rash: ? Use an over-the-counter antihistamine as told by your health care provider. ? Apply cold, wet cloths (cold compresses) to your skin or take baths or showers in cool water. Avoid hot water.  Take over-the-counter and prescription medicines only as told by your health care provider.  Tell all your health care providers that you have an allergy.  Keep all follow-up visits as told by your health care provider. This is important. How is  this prevented?  Avoid allergens that have caused an anaphylactic reaction in the past.  When you are at a restaurant, tell your server that you have an allergy. If you are not sure whether a menu item contains an ingredient that you are allergic to, ask your server. Where to find more information  American Academy of Allergy, Asthma and Immunology: aaaai.org  American Academy of Pediatrics: healthychildren.org Get help right away if:  You develop symptoms of an allergic reaction. You may notice them soon after you are exposed to a substance. Symptoms may include: ? Flushed skin. ? Hives. ? Swelling of the eyes, lips, face, mouth, tongue, or throat. ? Difficulty  breathing, speaking, or swallowing. ? Wheezing. ? Dizziness or light-headedness. ? Fainting. ? Pain or cramping in the abdomen. ? Vomiting. ? Diarrhea.  You used epinephrine. You need more medical care even if the medicine seems to be working. This is important because anaphylaxis may happen again within 72 hours (rebound anaphylaxis). You may need more doses of epinephrine. These symptoms may represent a serious problem that is an emergency. Do not wait to see if the symptoms will go away. Do the following right away:  Use the auto-injector pen as you have been instructed.  Get medical help. Call your local emergency services (911 in the [place] Do not drive yourself to the hospital. Summary  An anaphylactic reaction (anaphylaxis) is a sudden, severe allergic reaction by the body's disease-fighting system (immune system).  This condition can be life-threatening. If you have an anaphylactic reaction, get medical help right away.  Your health care provider may teach you how to use an auto-injector "pen" (pre-filled automatic epinephrine injection device) to give yourself a shot.  Always keep an auto-injector pen with you. This could save your life. Use it as told by your health care provider.  If you use epinephrine, you must still get emergency medical treatment, even if the medicine seems to be working. This information is not intended to replace advice given to you by your health care provider. Make sure you discuss any questions you have with your health care provider. Document Revised: 10/31/2017 Document Reviewed: 10/31/2017 Elsevier Patient Education  [place].

## 2019-11-18 ENCOUNTER — Encounter: Payer: Self-pay | Admitting: Adult Health

## 2019-11-19 ENCOUNTER — Other Ambulatory Visit: Payer: Self-pay

## 2019-11-19 ENCOUNTER — Ambulatory Visit (INDEPENDENT_AMBULATORY_CARE_PROVIDER_SITE_OTHER): Payer: Medicaid Other | Admitting: Certified Nurse Midwife

## 2019-11-19 ENCOUNTER — Encounter: Payer: Self-pay | Admitting: Certified Nurse Midwife

## 2019-11-19 VITALS — BP 146/105 | HR 75 | Ht 64.0 in | Wt 162.6 lb

## 2019-11-19 DIAGNOSIS — Z23 Encounter for immunization: Secondary | ICD-10-CM | POA: Diagnosis not present

## 2019-11-19 DIAGNOSIS — Z975 Presence of (intrauterine) contraceptive device: Secondary | ICD-10-CM | POA: Diagnosis not present

## 2019-11-19 DIAGNOSIS — N921 Excessive and frequent menstruation with irregular cycle: Secondary | ICD-10-CM

## 2019-11-19 DIAGNOSIS — I10 Essential (primary) hypertension: Secondary | ICD-10-CM

## 2019-11-19 MED ORDER — NIFEDIPINE ER OSMOTIC RELEASE 30 MG PO TB24: 30.0000 mg | ORAL_TABLET | Freq: Every day | ORAL | 0 refills | Status: DC

## 2019-11-19 MED ORDER — TETANUS-DIPHTH-ACELL PERTUSSIS 5-2.5-18.5 LF-MCG/0.5 IM SUSP
0.5000 mL | Freq: Once | INTRAMUSCULAR | Status: AC
  Administered 2019-11-19: 10:00:00 0.5 mL via INTRAMUSCULAR
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
## 2019-11-19 NOTE — Patient Instructions (Addendum)
Nifedipine Extended-Release Oral Tablets What is this medicine? NIFEDIPINE (nye FED i peen) is a calcium channel blocker. It relaxes your blood vessels and decreases the amount of work the heart has to do. It treats high blood pressure and/or prevents chest pain (also called angina). This medicine may be used for other purposes; ask your health care provider or pharmacist if you have questions. COMMON BRAND NAME(S): Adalat CC, Afeditab CR, Nifediac CC, Nifedical XL, Procardia XL What should I tell my health care provider before I take this medicine? They need to know if you have any of these conditions:  blockage in your bowels  constipation  heart attack  heart disease  heart failure  liver disease  low blood pressure  an unusual or allergic reaction to nifedipine, other drugs, foods, dyes or preservatives  pregnant or trying to get pregnant  breast-feeding How should I use this medicine? Take this drug by mouth. Take it as directed on the prescription label at the same time every day. Do not cut, crush or chew this drug. Swallow the tablets whole. Some tablets need to be taken on an empty stomach. Ask your pharmacist or health care provider if you have any questions. Keep taking it unless your health care provider tells you to stop. Do not take this drug with grapefruit juice. Talk to your health care provider about the use of this drug in children. Special care may be needed. Overdosage: If you think you have taken too much of this medicine contact a poison control center or emergency room at once. NOTE: This medicine is only for you. Do not share this medicine with others. What if I miss a dose? If you miss a dose, take it as soon as you can. If it is almost time for your next dose, take only that dose. Do not take double or extra doses. What may interact with this medicine? Do not take this medicine with any of the following medications:  certain medicines for seizures like  carbamazepine, phenobarbital, phenytoin  lumacaftor; ivacaftor  rifabutin  rifampin  rifapentine  St. John's Wort This medicine may also interact with the following medications:  antiviral medicines for HIV or AIDS  certain medicines for blood pressure  certain medicines for diabetes  certain medicines for erectile dysfunction  certain medicines for fungal infections like ketoconazole, fluconazole, and itraconazole  certain medicines for irregular heart beat like flecainide and quinidine  certain medicines that treat or prevent blood clots like warfarin  clarithromycin  digoxin  dolasetron  erythromycin  fluoxetine  grapefruit juice  local or general anesthetics  nefazodone  orlistat  quinupristin; dalfopristin  sirolimus  stomach acid blockers like cimetidine, ranitidine, omeprazole, or pantoprazole  tacrolimus  valproic acid This list may not describe all possible interactions. Give your health care provider a list of all the medicines, herbs, non-prescription drugs, or dietary supplements you use. Also tell them if you smoke, drink alcohol, or use illegal drugs. Some items may interact with your medicine. What should I watch for while using this medicine? Visit your health care provider for regular checks on your progress. Check your blood pressure as directed. Ask your health care provider what your blood pressure should be. Also, find out when you should contact him or her. Do not treat yourself for coughs, colds, or pain while you are using this drug without asking your health care provider for advice. Some drugs may increase your blood pressure. You may get drowsy or dizzy. Do not drive,  use machinery, or do anything that needs mental alertness until you know how this drug affects you. Do not stand up or sit up quickly, especially if you are an older patient. This reduces the risk of dizzy or fainting spells. The tablet shell for some brands of this  drug does not dissolve. This is normal. The tablet shell may appear whole in the stool. This is not a cause for concern. What side effects may I notice from receiving this medicine? Side effects that you should report to your doctor or health care provider as soon as possible:  allergic reactions (skin rash, itching or hives; swelling of the face, lips, or tongue)  heart attack (trouble breathing; pain or tightness in the chest, neck, back or arms; unusually weak or tired)  heart failure (trouble breathing; fast, irregular heartbeat; sudden weight gain; swelling of the ankles, feet, hands; unusually weak or tired)  low blood pressure (dizziness; feeling faint or lightheaded, falls; unusually weak or tired) Side effects that usually do not require medical attention (report to your doctor or health care provider if they continue or are bothersome):  bloating  changes in emotions or moods  constipation  facial flushing  headache  nasal congestion (like runny or stuffy nose)  nausea  stomach pain This list may not describe all possible side effects. Call your doctor for medical advice about side effects. You may report side effects to FDA at 1-800-FDA-1088. Where should I keep my medicine? Keep out of the reach of children and pets. Store at room temperature between 20 and 25 degrees C (68 and 77 degrees F). Protect from light and moisture. Keep the container tightly closed. Throw away any unused drug after the expiration date. NOTE: This sheet is a summary. It may not cover all possible information. If you have questions about this medicine, talk to your doctor, pharmacist, or health care provider.  2020 Elsevier/Gold Standard (2019-05-05 08:24:11)    Hypertension, Adult Hypertension is another name for high blood pressure. High blood pressure forces your heart to work harder to pump blood. This can cause problems over time. There are two numbers in a blood pressure reading.  There is a top number (systolic) over a bottom number (diastolic). It is best to have a blood pressure that is below 120/80. Healthy choices can help lower your blood pressure, or you may need medicine to help lower it. What are the causes? The cause of this condition is not known. Some conditions may be related to high blood pressure. What increases the risk?  Smoking.  Having type 2 diabetes mellitus, high cholesterol, or both.  Not getting enough exercise or physical activity.  Being overweight.  Having too much fat, sugar, calories, or salt (sodium) in your diet.  Drinking too much alcohol.  Having long-term (chronic) kidney disease.  Having a family history of high blood pressure.  Age. Risk increases with age.  Race. You may be at higher risk if you are African American.  Gender. Men are at higher risk than women before age 49. After age 38, women are at higher risk than men.  Having obstructive sleep apnea.  Stress. What are the signs or symptoms?  High blood pressure may not cause symptoms. Very high blood pressure (hypertensive crisis) may cause: ? Headache. ? Feelings of worry or nervousness (anxiety). ? Shortness of breath. ? Nosebleed. ? A feeling of being sick to your stomach (nausea). ? Throwing up (vomiting). ? Changes in how you see. ? Very  bad chest pain. ? Seizures. How is this treated?  This condition is treated by making healthy lifestyle changes, such as: ? Eating healthy foods. ? Exercising more. ? Drinking less alcohol.  Your health care provider may prescribe medicine if lifestyle changes are not enough to get your blood pressure under control, and if: ? Your top number is above 130. ? Your bottom number is above 80.  Your personal target blood pressure may vary. Follow these instructions at home: Eating and drinking   If told, follow the DASH eating plan. To follow this plan: ? Fill one half of your plate at each meal with fruits and  vegetables. ? Fill one fourth of your plate at each meal with whole grains. Whole grains include whole-wheat pasta, brown rice, and whole-grain bread. ? Eat or drink low-fat dairy products, such as skim milk or low-fat yogurt. ? Fill one fourth of your plate at each meal with low-fat (lean) proteins. Low-fat proteins include fish, chicken without skin, eggs, beans, and tofu. ? Avoid fatty meat, cured and processed meat, or chicken with skin. ? Avoid pre-made or processed food.  Eat less than 1,500 mg of salt each day.  Do not drink alcohol if: ? Your doctor tells you not to drink. ? You are pregnant, may be pregnant, or are planning to become pregnant.  If you drink alcohol: ? Limit how much you use to:  0-1 drink a day for women.  0-2 drinks a day for men. ? Be aware of how much alcohol is in your drink. In the [place] one drink equals one 12 oz bottle of beer (355 mL), one 5 oz glass of wine (148 mL), or one 1 oz glass of hard liquor (44 mL). Lifestyle   Work with your doctor to stay at a healthy weight or to lose weight. Ask your doctor what the best weight is for you.  Get at least 30 minutes of exercise most days of the week. This may include walking, swimming, or biking.  Get at least 30 minutes of exercise that strengthens your muscles (resistance exercise) at least 3 days a week. This may include lifting weights or doing Pilates.  Do not use any products that contain nicotine or tobacco, such as cigarettes, e-cigarettes, and chewing tobacco. If you need help quitting, ask your doctor.  Check your blood pressure at home as told by your doctor.  Keep all follow-up visits as told by your doctor. This is important. Medicines  Take over-the-counter and prescription medicines only as told by your doctor. Follow directions carefully.  Do not skip doses of blood pressure medicine. The medicine does not work as well if you skip doses. Skipping doses also puts you at risk for  problems.  Ask your doctor about side effects or reactions to medicines that you should watch for. Contact a doctor if you:  Think you are having a reaction to the medicine you are taking.  Have headaches that keep coming back (recurring).  Feel dizzy.  Have swelling in your ankles.  Have trouble with your vision. Get help right away if you:  Get a very bad headache.  Start to feel mixed up (confused).  Feel weak or numb.  Feel faint.  Have very bad pain in your: ? Chest. ? Belly (abdomen).  Throw up more than once.  Have trouble breathing. Summary  Hypertension is another name for high blood pressure.  High blood pressure forces your heart to work harder to pump blood.  For most people, a normal blood pressure is less than 120/80.  Making healthy choices can help lower blood pressure. If your blood pressure does not get lower with healthy choices, you may need to take medicine. This information is not intended to replace advice given to you by your health care provider. Make sure you discuss any questions you have with your health care provider. Document Revised: 03/19/2018 Document Reviewed: 03/19/2018 Elsevier Patient Education  2020 [place] is this medicine? ETONOGESTREL (et oh noe JES trel) is a contraceptive (birth control) device. It is used to prevent pregnancy. It can be used for up to 3 years. This medicine may be used for other purposes; ask your health care provider or pharmacist if you have questions. COMMON BRAND NAME(S): Implanon, Nexplanon What should I tell my health care provider before I take this medicine? They need to know if you have any of these conditions:  abnormal vaginal bleeding  blood vessel disease or blood clots  breast, cervical, endometrial, ovarian, liver, or uterine cancer  diabetes  gallbladder disease  heart disease or recent heart attack  high blood pressure  high cholesterol  or triglycerides  kidney disease  liver disease  migraine headaches  seizures  stroke  tobacco smoker  an unusual or allergic reaction to etonogestrel, anesthetics or antiseptics, other medicines, foods, dyes, or preservatives  pregnant or trying to get pregnant  breast-feeding How should I use this medicine? This device is inserted just under the skin on the inner side of your upper arm by a health care professional. Talk to your pediatrician regarding the use of this medicine in children. Special care may be needed. Overdosage: If you think you have taken too much of this medicine contact a poison control center or emergency room at once. NOTE: This medicine is only for you. Do not share this medicine with others. What if I miss a dose? This does not apply. What may interact with this medicine? Do not take this medicine with any of the following medications:  amprenavir  fosamprenavir This medicine may also interact with the following medications:  acitretin  aprepitant  armodafinil  bexarotene  bosentan  carbamazepine  certain medicines for fungal infections like fluconazole, ketoconazole, itraconazole and voriconazole  certain medicines to treat hepatitis, HIV or AIDS  cyclosporine  felbamate  griseofulvin  lamotrigine  modafinil  oxcarbazepine  phenobarbital  phenytoin  primidone  rifabutin  rifampin  rifapentine  St. John's wort  topiramate This list may not describe all possible interactions. Give your health care provider a list of all the medicines, herbs, non-prescription drugs, or dietary supplements you use. Also tell them if you smoke, drink alcohol, or use illegal drugs. Some items may interact with your medicine. What should I watch for while using this medicine? This product does not protect you against HIV infection (AIDS) or other sexually transmitted diseases. You should be able to feel the implant by pressing your  fingertips over the skin where it was inserted. Contact your doctor if you cannot feel the implant, and use a non-hormonal birth control method (such as condoms) until your doctor confirms that the implant is in place. Contact your doctor if you think that the implant may have broken or become bent while in your arm. You will receive a user card from your health care provider after the implant is inserted. The card is a record of the location of the implant in your upper arm and  when it should be removed. Keep this card with your health records. What side effects may I notice from receiving this medicine? Side effects that you should report to your doctor or health care professional as soon as possible:  allergic reactions like skin rash, itching or hives, swelling of the face, lips, or tongue  breast lumps, breast tissue changes, or discharge  breathing problems  changes in emotions or moods  coughing up blood  if you feel that the implant may have broken or bent while in your arm  high blood pressure  pain, irritation, swelling, or bruising at the insertion site  scar at site of insertion  signs of infection at the insertion site such as fever, and skin redness, pain or discharge  signs and symptoms of a blood clot such as breathing problems; changes in vision; chest pain; severe, sudden headache; pain, swelling, warmth in the leg; trouble speaking; sudden numbness or weakness of the face, arm or leg  signs and symptoms of liver injury like dark yellow or brown urine; general ill feeling or flu-like symptoms; light-colored stools; loss of appetite; nausea; right upper belly pain; unusually weak or tired; yellowing of the eyes or skin  unusual vaginal bleeding, discharge Side effects that usually do not require medical attention (report to your doctor or health care professional if they continue or are bothersome):  acne  breast pain or tenderness  headache  irregular menstrual  bleeding  nausea This list may not describe all possible side effects. Call your doctor for medical advice about side effects. You may report side effects to FDA at 1-800-FDA-1088. Where should I keep my medicine? This drug is given in a hospital or clinic and will not be stored at home. NOTE: This sheet is a summary. It may not cover all possible information. If you have questions about this medicine, talk to your doctor, pharmacist, or health care provider.  2020 Elsevier/Gold Standard (2019-04-21 11:33:04)  Preparing for Pregnancy If you are considering becoming pregnant, make an appointment to see your regular health care provider to learn how to prepare for a safe and healthy pregnancy (preconception care). During a preconception care visit, your health care provider will:  Do a complete physical exam, including a Pap test.  Take a complete medical history.  Give you information, answer your questions, and help you resolve problems. Preconception checklist Medical history  Tell your health care provider about any current or past medical conditions. Your pregnancy or your ability to become pregnant may be affected by chronic conditions, such as diabetes, chronic hypertension, and thyroid problems.  Include your family's medical history as well as your partner's medical history.  Tell your health care provider about any history of STIs (sexually transmitted infections).These can affect your pregnancy. In some cases, they can be passed to your baby. Discuss any concerns that you have about STIs.  If indicated, discuss the benefits of genetic testing. This testing will show whether there are any genetic conditions that may be passed from you or your partner to your baby.  Tell your health care provider about: ? Any problems you have had with conception or pregnancy. ? Any medicines you take. These include vitamins, herbal supplements, and over-the-counter medicines. ? Your history  of immunizations. Discuss any vaccinations that you may need. Diet  Ask your health care provider what to include in a healthy diet that has a balance of nutrients. This is especially important when you are pregnant or preparing to become pregnant.  Ask your health care provider to help you reach a healthy weight before pregnancy. ? If you are overweight, you may be at higher risk for certain complications, such as high blood pressure, diabetes, and preterm birth. ? If you are underweight, you are more likely to have a baby who has a low birth weight. Lifestyle, work, and home  Let your health care provider know: ? About any lifestyle habits that you have, such as alcohol use, drug use, or smoking. ? About recreational activities that may put you at risk during pregnancy, such as downhill skiing and certain exercise programs. ? Tell your health care provider about any international travel, especially any travel to places with an active [place] virus outbreak. ? About harmful substances that you may be exposed to at work or at home. These include chemicals, pesticides, radiation, or even litter boxes. ? If you do not feel safe at home. Mental health  Tell your health care provider about: ? Any history of mental health conditions, including feelings of depression, sadness, or anxiety. ? Any medicines that you take for a mental health condition. These include herbs and supplements. Home instructions to prepare for pregnancy Lifestyle   Eat a balanced diet. This includes fresh fruits and vegetables, whole grains, lean meats, low-fat dairy products, healthy fats, and foods that are high in fiber. Ask to meet with a nutritionist or registered dietitian for assistance with meal planning and goals.  Get regular exercise. Try to be active for at least 30 minutes a day on most days of the week. Ask your health care provider which activities are safe during pregnancy.  Do not use any products that  contain nicotine or tobacco, such as cigarettes and e-cigarettes. If you need help quitting, ask your health care provider.  Do not drink alcohol.  Do not take illegal drugs.  Maintain a healthy weight. Ask your health care provider what weight range is right for you. General instructions  Keep an accurate record of your menstrual periods. This makes it easier for your health care provider to determine your baby's due date.  Begin taking prenatal vitamins and folic acid supplements daily as directed by your health care provider.  Manage any chronic conditions, such as high blood pressure and diabetes, as told by your health care provider. This is important. How do I know that I am pregnant? You may be pregnant if you have been sexually active and you miss your period. Symptoms of early pregnancy include:  Mild cramping.  Very light vaginal bleeding (spotting).  Feeling unusually tired.  Nausea and vomiting (morning sickness). If you have any of these symptoms and you suspect that you might be pregnant, you can take a home pregnancy test. These tests check for a hormone in your urine (human chorionic gonadotropin, or hCG). A woman's body begins to make this hormone during early pregnancy. These tests are very accurate. Wait until at least the first day after you miss your period to take one. If the test shows that you are pregnant (you get a positive result), call your health care provider to make an appointment for prenatal care. What should I do if I become pregnant?      Make an appointment with your health care provider as soon as you suspect you are pregnant.  Do not use any products that contain nicotine, such as cigarettes, chewing tobacco, and e-cigarettes. If you need help quitting, ask your health care provider.  Do not drink alcoholic beverages. Alcohol  is related to a number of birth defects.  Avoid toxic odors and chemicals.  You may continue to have sexual  intercourse if it does not cause pain or other problems, such as vaginal bleeding. This information is not intended to replace advice given to you by your health care provider. Make sure you discuss any questions you have with your health care provider. Document Revised: 07/11/2017 Document Reviewed: 01/29/2016 Elsevier Patient Education  [place].

## 2019-11-27 ENCOUNTER — Other Ambulatory Visit: Payer: Self-pay

## 2019-11-27 ENCOUNTER — Ambulatory Visit (INDEPENDENT_AMBULATORY_CARE_PROVIDER_SITE_OTHER): Payer: Medicaid Other | Admitting: Certified Nurse Midwife

## 2019-11-27 ENCOUNTER — Encounter: Payer: Self-pay | Admitting: Certified Nurse Midwife

## 2019-11-27 VITALS — BP 150/83 | HR 78 | Ht 64.0 in | Wt 165.4 lb

## 2019-11-27 DIAGNOSIS — Z975 Presence of (intrauterine) contraceptive device: Secondary | ICD-10-CM

## 2019-11-27 DIAGNOSIS — I10 Essential (primary) hypertension: Secondary | ICD-10-CM | POA: Diagnosis not present

## 2019-11-27 DIAGNOSIS — N921 Excessive and frequent menstruation with irregular cycle: Secondary | ICD-10-CM

## 2019-11-27 DIAGNOSIS — Z3046 Encounter for surveillance of implantable subdermal contraceptive: Secondary | ICD-10-CM | POA: Diagnosis not present

## 2019-11-27 DIAGNOSIS — F1910 Other psychoactive substance abuse, uncomplicated: Secondary | ICD-10-CM
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
## 2019-11-27 NOTE — Patient Instructions (Signed)
Nifedipine Oral Capsules What is this medicine? NIFEDIPINE (nye FED i peen) is a calcium channel blocker. It relaxes your blood vessels and decreases the amount of work the heart has to do. It treats and/or prevents chest pain (also called angina). This medicine may be used for other purposes; ask your health care provider or pharmacist if you have questions. COMMON BRAND NAME(S): Adalat, Procardia What should I tell my health care provider before I take this medicine? They need to know if you have any of these conditions:  heart attack  heart disease  heart failure  high blood pressure  low blood pressure  an unusual or allergic reaction to nifedipine, other drugs, foods, dyes or preservatives  pregnant or trying to get pregnant  breast-feeding How should I use this medicine? Take this drug by mouth. Take it as directed on the prescription label at the same time every day. You can take it with or without food. If it upsets your stomach, take it with food. Keep taking it unless your health care provider tells you to stop. Do not take this drug with grapefruit juice. Talk to your health care provider about the use of this drug in children. Special care may be needed. Overdosage: If you think you have taken too much of this medicine contact a poison control center or emergency room at once. NOTE: This medicine is only for you. Do not share this medicine with others. What if I miss a dose? If you miss a dose, take it as soon as you can. If it is almost time for your next dose, take only that dose. Do not take double or extra doses. What may interact with this medicine? Do not take this medicine with any of the following medications:  certain medicines for seizures like carbamazepine, phenobarbital, phenytoin  lumacaftor; ivacaftor  rifabutin  rifampin  rifapentine  St. John's Wort This medicine may also interact with the following medications:  antiviral medicines for HIV  or AIDS  certain medicines for blood pressure  certain medicines for diabetes  certain medicines for erectile dysfunction  certain medicines for fungal infections like ketoconazole, fluconazole, and itraconazole  certain medicines for irregular heart beat like flecainide and quinidine  certain medicines that treat or prevent blood clots like warfarin  clarithromycin  digoxin  dolasetron  erythromycin  fluoxetine  grapefruit juice  local or general anesthetics  nefazodone  orlistat  quinupristin; dalfopristin  sirolimus  stomach acid blockers like cimetidine, ranitidine, omeprazole, or pantoprazole  tacrolimus  valproic acid This list may not describe all possible interactions. Give your health care provider a list of all the medicines, herbs, non-prescription drugs, or dietary supplements you use. Also tell them if you smoke, drink alcohol, or use illegal drugs. Some items may interact with your medicine. What should I watch for while using this medicine? Visit your health care provider for regular checks on your progress. Check your blood pressure as directed. Ask your health care provider what your blood pressure should be. Also, find out when you should contact him or her. Do not treat yourself for coughs, colds, or pain while you are using this drug without asking your health care provider for advice. Some drugs may increase your blood pressure. You may get drowsy or dizzy. Do not drive, use machinery, or do anything that needs mental alertness until you know how this drug affects you. Do not stand up or sit up quickly, especially if you are an older patient. This reduces the  risk of dizzy or fainting spells. What side effects may I notice from receiving this medicine? Side effects that you should report to your doctor or health care provider as soon as possible:  allergic reactions (skin rash, itching or hives; swelling of the face, lips, or tongue)  heart  attack (trouble breathing; pain or tightness in the chest, neck, back or arms; unusually weak or tired)  heart failure (trouble breathing; fast, irregular heartbeat; sudden weight gain; swelling of the ankles, feet, hands; unusually weak or tired)  low blood pressure (dizziness; feeling faint or lightheaded, falls; unusually weak or tired) Side effects that usually do not require medical attention (report to your doctor or health care provider if they continue or are bothersome):  changes in emotions or moods  cough  facial flushing  headache  heartburn (burning feeling in chest, often after eating or when lying down)  muscle cramps  nasal congestion (like runny or stuffy nose)  nausea  tremors This list may not describe all possible side effects. Call your doctor for medical advice about side effects. You may report side effects to FDA at 1-800-FDA-1088. Where should I keep my medicine? Keep out of the reach of children and pets. Store at room temperature between 15 and 25 degrees C (59 and 77 degrees F). Protect from light and moisture. Keep the container tightly closed. Throw away any unused drug after the expiration date. NOTE: This sheet is a summary. It may not cover all possible information. If you have questions about this medicine, talk to your doctor, pharmacist, or health care provider.  2020 Elsevier/Gold Standard (2019-04-14 19:08:10)    Hypertension, Adult Hypertension is another name for high blood pressure. High blood pressure forces your heart to work harder to pump blood. This can cause problems over time. There are two numbers in a blood pressure reading. There is a top number (systolic) over a bottom number (diastolic). It is best to have a blood pressure that is below 120/80. Healthy choices can help lower your blood pressure, or you may need medicine to help lower it. What are the causes? The cause of this condition is not known. Some conditions may be  related to high blood pressure. What increases the risk?  Smoking.  Having type 2 diabetes mellitus, high cholesterol, or both.  Not getting enough exercise or physical activity.  Being overweight.  Having too much fat, sugar, calories, or salt (sodium) in your diet.  Drinking too much alcohol.  Having long-term (chronic) kidney disease.  Having a family history of high blood pressure.  Age. Risk increases with age.  Race. You may be at higher risk if you are African American.  Gender. Men are at higher risk than women before age 65. After age 36, women are at higher risk than men.  Having obstructive sleep apnea.  Stress. What are the signs or symptoms?  High blood pressure may not cause symptoms. Very high blood pressure (hypertensive crisis) may cause: ? Headache. ? Feelings of worry or nervousness (anxiety). ? Shortness of breath. ? Nosebleed. ? A feeling of being sick to your stomach (nausea). ? Throwing up (vomiting). ? Changes in how you see. ? Very bad chest pain. ? Seizures. How is this treated?  This condition is treated by making healthy lifestyle changes, such as: ? Eating healthy foods. ? Exercising more. ? Drinking less alcohol.  Your health care provider may prescribe medicine if lifestyle changes are not enough to get your blood pressure under control,  and if: ? Your top number is above 130. ? Your bottom number is above 80.  Your personal target blood pressure may vary. Follow these instructions at home: Eating and drinking   If told, follow the DASH eating plan. To follow this plan: ? Fill one half of your plate at each meal with fruits and vegetables. ? Fill one fourth of your plate at each meal with whole grains. Whole grains include whole-wheat pasta, brown rice, and whole-grain bread. ? Eat or drink low-fat dairy products, such as skim milk or low-fat yogurt. ? Fill one fourth of your plate at each meal with low-fat (lean) proteins.  Low-fat proteins include fish, chicken without skin, eggs, beans, and tofu. ? Avoid fatty meat, cured and processed meat, or chicken with skin. ? Avoid pre-made or processed food.  Eat less than 1,500 mg of salt each day.  Do not drink alcohol if: ? Your doctor tells you not to drink. ? You are pregnant, may be pregnant, or are planning to become pregnant.  If you drink alcohol: ? Limit how much you use to:  0-1 drink a day for women.  0-2 drinks a day for men. ? Be aware of how much alcohol is in your drink. In the [place] one drink equals one 12 oz bottle of beer (355 mL), one 5 oz glass of wine (148 mL), or one 1 oz glass of hard liquor (44 mL). Lifestyle   Work with your doctor to stay at a healthy weight or to lose weight. Ask your doctor what the best weight is for you.  Get at least 30 minutes of exercise most days of the week. This may include walking, swimming, or biking.  Get at least 30 minutes of exercise that strengthens your muscles (resistance exercise) at least 3 days a week. This may include lifting weights or doing Pilates.  Do not use any products that contain nicotine or tobacco, such as cigarettes, e-cigarettes, and chewing tobacco. If you need help quitting, ask your doctor.  Check your blood pressure at home as told by your doctor.  Keep all follow-up visits as told by your doctor. This is important. Medicines  Take over-the-counter and prescription medicines only as told by your doctor. Follow directions carefully.  Do not skip doses of blood pressure medicine. The medicine does not work as well if you skip doses. Skipping doses also puts you at risk for problems.  Ask your doctor about side effects or reactions to medicines that you should watch for. Contact a doctor if you:  Think you are having a reaction to the medicine you are taking.  Have headaches that keep coming back (recurring).  Feel dizzy.  Have swelling in your ankles.  Have trouble  with your vision. Get help right away if you:  Get a very bad headache.  Start to feel mixed up (confused).  Feel weak or numb.  Feel faint.  Have very bad pain in your: ? Chest. ? Belly (abdomen).  Throw up more than once.  Have trouble breathing. Summary  Hypertension is another name for high blood pressure.  High blood pressure forces your heart to work harder to pump blood.  For most people, a normal blood pressure is less than 120/80.  Making healthy choices can help lower blood pressure. If your blood pressure does not get lower with healthy choices, you may need to take medicine. This information is not intended to replace advice given to you by your health care  provider. Make sure you discuss any questions you have with your health care provider. Document Revised: 03/19/2018 Document Reviewed: 03/19/2018 Elsevier Patient Education  2020 [employer].    Preparing for Pregnancy If you are considering becoming pregnant, make an appointment to see your regular health care provider to learn how to prepare for a safe and healthy pregnancy (preconception care). During a preconception care visit, your health care provider will:  Do a complete physical exam, including a Pap test.  Take a complete medical history.  Give you information, answer your questions, and help you resolve problems. Preconception checklist Medical history  Tell your health care provider about any current or past medical conditions. Your pregnancy or your ability to become pregnant may be affected by chronic conditions, such as diabetes, chronic hypertension, and thyroid problems.  Include your family's medical history as well as your partner's medical history.  Tell your health care provider about any history of STIs (sexually transmitted infections).These can affect your pregnancy. In some cases, they can be passed to your baby. Discuss any concerns that you have about STIs.  If indicated,  discuss the benefits of genetic testing. This testing will show whether there are any genetic conditions that may be passed from you or your partner to your baby.  Tell your health care provider about: ? Any problems you have had with conception or pregnancy. ? Any medicines you take. These include vitamins, herbal supplements, and over-the-counter medicines. ? Your history of immunizations. Discuss any vaccinations that you may need. Diet  Ask your health care provider what to include in a healthy diet that has a balance of nutrients. This is especially important when you are pregnant or preparing to become pregnant.  Ask your health care provider to help you reach a healthy weight before pregnancy. ? If you are overweight, you may be at higher risk for certain complications, such as high blood pressure, diabetes, and preterm birth. ? If you are underweight, you are more likely to have a baby who has a low birth weight. Lifestyle, work, and home  Let your health care provider know: ? About any lifestyle habits that you have, such as alcohol use, drug use, or smoking. ? About recreational activities that may put you at risk during pregnancy, such as downhill skiing and certain exercise programs. ? Tell your health care provider about any international travel, especially any travel to places with an active [place] virus outbreak. ? About harmful substances that you may be exposed to at work or at home. These include chemicals, pesticides, radiation, or even litter boxes. ? If you do not feel safe at home. Mental health  Tell your health care provider about: ? Any history of mental health conditions, including feelings of depression, sadness, or anxiety. ? Any medicines that you take for a mental health condition. These include herbs and supplements. Home instructions to prepare for pregnancy Lifestyle   Eat a balanced diet. This includes fresh fruits and vegetables, whole grains, lean  meats, low-fat dairy products, healthy fats, and foods that are high in fiber. Ask to meet with a nutritionist or registered dietitian for assistance with meal planning and goals.  Get regular exercise. Try to be active for at least 30 minutes a day on most days of the week. Ask your health care provider which activities are safe during pregnancy.  Do not use any products that contain nicotine or tobacco, such as cigarettes and e-cigarettes. If you need help quitting, ask your health  care provider.  Do not drink alcohol.  Do not take illegal drugs.  Maintain a healthy weight. Ask your health care provider what weight range is right for you. General instructions  Keep an accurate record of your menstrual periods. This makes it easier for your health care provider to determine your baby's due date.  Begin taking prenatal vitamins and folic acid supplements daily as directed by your health care provider.  Manage any chronic conditions, such as high blood pressure and diabetes, as told by your health care provider. This is important. How do I know that I am pregnant? You may be pregnant if you have been sexually active and you miss your period. Symptoms of early pregnancy include:  Mild cramping.  Very light vaginal bleeding (spotting).  Feeling unusually tired.  Nausea and vomiting (morning sickness). If you have any of these symptoms and you suspect that you might be pregnant, you can take a home pregnancy test. These tests check for a hormone in your urine (human chorionic gonadotropin, or hCG). A woman's body begins to make this hormone during early pregnancy. These tests are very accurate. Wait until at least the first day after you miss your period to take one. If the test shows that you are pregnant (you get a positive result), call your health care provider to make an appointment for prenatal care. What should I do if I become pregnant?      Make an appointment with your health  care provider as soon as you suspect you are pregnant.  Do not use any products that contain nicotine, such as cigarettes, chewing tobacco, and e-cigarettes. If you need help quitting, ask your health care provider.  Do not drink alcoholic beverages. Alcohol is related to a number of birth defects.  Avoid toxic odors and chemicals.  You may continue to have sexual intercourse if it does not cause pain or other problems, such as vaginal bleeding. This information is not intended to replace advice given to you by your health care provider. Make sure you discuss any questions you have with your health care provider. Document Revised: 07/11/2017 Document Reviewed: 01/29/2016 Elsevier Patient Education  [place].

## 2019-12-04 ENCOUNTER — Ambulatory Visit (INDEPENDENT_AMBULATORY_CARE_PROVIDER_SITE_OTHER): Payer: Medicaid Other | Admitting: Certified Nurse Midwife

## 2019-12-04 ENCOUNTER — Other Ambulatory Visit: Payer: Self-pay

## 2019-12-04 ENCOUNTER — Encounter: Payer: Self-pay | Admitting: Certified Nurse Midwife

## 2019-12-04 ENCOUNTER — Other Ambulatory Visit: Payer: Self-pay | Admitting: Certified Nurse Midwife

## 2019-12-04 VITALS — BP 135/98 | HR 83 | Ht 64.0 in | Wt 161.8 lb

## 2019-12-04 DIAGNOSIS — I10 Essential (primary) hypertension: Secondary | ICD-10-CM | POA: Diagnosis not present

## 2019-12-04 DIAGNOSIS — Z0130 Encounter for examination of blood pressure without abnormal findings: Secondary | ICD-10-CM

## 2019-12-04 NOTE — Patient Instructions (Signed)
Nifedipine Oral Capsules What is this medicine? NIFEDIPINE (nye FED i peen) is a calcium channel blocker. It relaxes your blood vessels and decreases the amount of work the heart has to do. It treats and/or prevents chest pain (also called angina). This medicine may be used for other purposes; ask your health care provider or pharmacist if you have questions. COMMON BRAND NAME(S): Adalat, Procardia What should I tell my health care provider before I take this medicine? They need to know if you have any of these conditions:  heart attack  heart disease  heart failure  high blood pressure  low blood pressure  an unusual or allergic reaction to nifedipine, other drugs, foods, dyes or preservatives  pregnant or trying to get pregnant  breast-feeding How should I use this medicine? Take this drug by mouth. Take it as directed on the prescription label at the same time every day. You can take it with or without food. If it upsets your stomach, take it with food. Keep taking it unless your health care provider tells you to stop. Do not take this drug with grapefruit juice. Talk to your health care provider about the use of this drug in children. Special care may be needed. Overdosage: If you think you have taken too much of this medicine contact a poison control center or emergency room at once. NOTE: This medicine is only for you. Do not share this medicine with others. What if I miss a dose? If you miss a dose, take it as soon as you can. If it is almost time for your next dose, take only that dose. Do not take double or extra doses. What may interact with this medicine? Do not take this medicine with any of the following medications:  certain medicines for seizures like carbamazepine, phenobarbital, phenytoin  lumacaftor; ivacaftor  rifabutin  rifampin  rifapentine  St. John's Wort This medicine may also interact with the following medications:  antiviral medicines for HIV  or AIDS  certain medicines for blood pressure  certain medicines for diabetes  certain medicines for erectile dysfunction  certain medicines for fungal infections like ketoconazole, fluconazole, and itraconazole  certain medicines for irregular heart beat like flecainide and quinidine  certain medicines that treat or prevent blood clots like warfarin  clarithromycin  digoxin  dolasetron  erythromycin  fluoxetine  grapefruit juice  local or general anesthetics  nefazodone  orlistat  quinupristin; dalfopristin  sirolimus  stomach acid blockers like cimetidine, ranitidine, omeprazole, or pantoprazole  tacrolimus  valproic acid This list may not describe all possible interactions. Give your health care provider a list of all the medicines, herbs, non-prescription drugs, or dietary supplements you use. Also tell them if you smoke, drink alcohol, or use illegal drugs. Some items may interact with your medicine. What should I watch for while using this medicine? Visit your health care provider for regular checks on your progress. Check your blood pressure as directed. Ask your health care provider what your blood pressure should be. Also, find out when you should contact him or her. Do not treat yourself for coughs, colds, or pain while you are using this drug without asking your health care provider for advice. Some drugs may increase your blood pressure. You may get drowsy or dizzy. Do not drive, use machinery, or do anything that needs mental alertness until you know how this drug affects you. Do not stand up or sit up quickly, especially if you are an older patient. This reduces the  risk of dizzy or fainting spells. What side effects may I notice from receiving this medicine? Side effects that you should report to your doctor or health care provider as soon as possible:  allergic reactions (skin rash, itching or hives; swelling of the face, lips, or tongue)  heart  attack (trouble breathing; pain or tightness in the chest, neck, back or arms; unusually weak or tired)  heart failure (trouble breathing; fast, irregular heartbeat; sudden weight gain; swelling of the ankles, feet, hands; unusually weak or tired)  low blood pressure (dizziness; feeling faint or lightheaded, falls; unusually weak or tired) Side effects that usually do not require medical attention (report to your doctor or health care provider if they continue or are bothersome):  changes in emotions or moods  cough  facial flushing  headache  heartburn (burning feeling in chest, often after eating or when lying down)  muscle cramps  nasal congestion (like runny or stuffy nose)  nausea  tremors This list may not describe all possible side effects. Call your doctor for medical advice about side effects. You may report side effects to FDA at 1-800-FDA-1088. Where should I keep my medicine? Keep out of the reach of children and pets. Store at room temperature between 15 and 25 degrees C (59 and 77 degrees F). Protect from light and moisture. Keep the container tightly closed. Throw away any unused drug after the expiration date. NOTE: This sheet is a summary. It may not cover all possible information. If you have questions about this medicine, talk to your doctor, pharmacist, or health care provider.  2020 Elsevier/Gold Standard (2019-04-14 19:08:10)
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
## 2019-12-04 NOTE — Progress Notes (Signed)
Pt present for blood pressure check. Pt stated that she is taking her Procardia daily as prescribed. bp 135/98 p 83.

## 2019-12-07 ENCOUNTER — Other Ambulatory Visit: Payer: Self-pay

## 2019-12-07 DIAGNOSIS — I10 Essential (primary) hypertension: Secondary | ICD-10-CM | POA: Insufficient documentation

## 2019-12-07 NOTE — Progress Notes (Signed)
I have seen, interviewed, and examined the patient in conjunction with the [place] [facility] Nurse Practitioner student and affirm the diagnosis and management plan.   Diona Fanti, CNM [facility], [facility] 12/07/19 1:07 PM

## 2019-12-08 ENCOUNTER — Telehealth: Payer: Self-pay | Admitting: Certified Nurse Midwife

## 2019-12-08 ENCOUNTER — Ambulatory Visit: Payer: Medicaid Other | Admitting: Gastroenterology

## 2019-12-08 ENCOUNTER — Encounter: Payer: Self-pay | Admitting: *Deleted

## 2019-12-10 ENCOUNTER — Other Ambulatory Visit: Payer: Self-pay | Admitting: Certified Nurse Midwife

## 2019-12-10 NOTE — Telephone Encounter (Signed)
Refill sent. JML

## 2019-12-10 NOTE — Telephone Encounter (Signed)
Pt stated she called in yesterday for a refill on her NIFEdipine.  The pt uses [facility] on [address]. Please advise

## 2019-12-17 ENCOUNTER — Emergency Department
Admission: EM | Admit: 2019-12-17 | Discharge: 2019-12-17 | Disposition: A | Discharge status: Home / Self Care | Payer: Medicaid Other | Attending: Emergency Medicine | Admitting: Emergency Medicine

## 2019-12-17 ENCOUNTER — Other Ambulatory Visit: Payer: Self-pay

## 2019-12-17 DIAGNOSIS — R1030 Lower abdominal pain, unspecified: Secondary | ICD-10-CM | POA: Diagnosis present

## 2019-12-17 DIAGNOSIS — Z5321 Procedure and treatment not carried out due to patient leaving prior to being seen by health care provider: Secondary | ICD-10-CM | POA: Diagnosis not present

## 2019-12-17 LAB — COMPREHENSIVE METABOLIC PANEL
ALT: 12 U/L (ref 0–44)
AST: 18 U/L (ref 15–41)
Albumin: 4.1 g/dL (ref 3.5–5.0)
Alkaline Phosphatase: 46 U/L (ref 38–126)
Anion gap: 9 (ref 5–15)
BUN: 12 mg/dL (ref 6–20)
CO2: 24 mmol/L (ref 22–32)
Calcium: 9.2 mg/dL (ref 8.9–10.3)
Chloride: 105 mmol/L (ref 98–111)
Creatinine, Ser: 0.59 mg/dL (ref 0.44–1.00)
GFR calc Af Amer: >60 mL/min (ref >60)
GFR calc non Af Amer: >60 mL/min (ref >60)
Glucose, Bld: 109 mg/dL — ABNORMAL HIGH (ref 70–99)
Potassium: 3.6 mmol/L (ref 3.5–5.1)
Sodium: 138 mmol/L (ref 135–145)
Total Bilirubin: 0.6 mg/dL (ref 0.3–1.2)
Total Protein: 7.7 g/dL (ref 6.5–8.1)

## 2019-12-17 LAB — LIPASE, BLOOD: Lipase: 27 U/L (ref 11–51)

## 2019-12-17 LAB — URINALYSIS, COMPLETE (UACMP) WITH MICROSCOPIC
Bacteria, UA: NONE SEEN
Bilirubin Urine: NEGATIVE
Glucose, UA: NEGATIVE mg/dL
Hgb urine dipstick: NEGATIVE
Ketones, ur: NEGATIVE mg/dL
Leukocytes,Ua: NEGATIVE
Nitrite: NEGATIVE
Protein, ur: 30 mg/dL — AB
Specific Gravity, Urine: 1.025 (ref 1.005–1.030)
pH: 7 (ref 5.0–8.0)

## 2019-12-17 LAB — CBC
HCT: 38.5 % (ref 36.0–46.0)
Hemoglobin: 12.3 g/dL (ref 12.0–15.0)
MCH: 26.2 pg (ref 26.0–34.0)
MCHC: 31.9 g/dL (ref 30.0–36.0)
MCV: 81.9 fL (ref 80.0–100.0)
Platelets: 299 10*3/uL (ref 150–400)
RBC: 4.7 MIL/uL (ref 3.87–5.11)
RDW: 14.6 % (ref 11.5–15.5)
WBC: 8 10*3/uL (ref 4.0–10.5)
nRBC: 0 % (ref 0.0–0.2)

## 2019-12-17 LAB — POCT PREGNANCY, URINE: Preg Test, Ur: NEGATIVE

## 2019-12-17 MED ORDER — SODIUM CHLORIDE 0.9% FLUSH: 3.0000 mL | Freq: Once | INTRAVENOUS | Status: DC

## 2019-12-17 NOTE — ED Notes (Signed)
No answer when called several times from lobby

## 2019-12-17 NOTE — ED Triage Notes (Signed)
Pt arrives via POV from home for reports of rectal pain that occurs every 5-10 mins which started today. Pt reports the pain feels like the pain she felt when having a baby but pt reports she is not pregnant. PT reports lower abdominal pain. Pt had a BM this morning that was normal for her, reports nausea.

## 2019-12-18 ENCOUNTER — Other Ambulatory Visit: Payer: Self-pay

## 2019-12-18 ENCOUNTER — Ambulatory Visit (INDEPENDENT_AMBULATORY_CARE_PROVIDER_SITE_OTHER): Payer: Medicaid Other | Admitting: Certified Nurse Midwife

## 2019-12-18 ENCOUNTER — Encounter: Payer: Self-pay | Admitting: Certified Nurse Midwife

## 2019-12-18 VITALS — BP 132/82 | HR 80 | Ht 64.0 in | Wt 165.6 lb

## 2019-12-18 DIAGNOSIS — I10 Essential (primary) hypertension: Secondary | ICD-10-CM
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
## 2019-12-18 NOTE — Progress Notes (Signed)
Pt present for bp check. Pt stated that she was doing well no problems.

## 2019-12-18 NOTE — Progress Notes (Signed)
I have seen, interviewed, and examined the patient in conjunction with the [place] [facility] Nurse Practitioner student and affirm the diagnosis and management plan.   Diona Fanti, CNM [facility], [facility] 12/18/19 5:30 PM

## 2019-12-18 NOTE — Patient Instructions (Signed)
Nifedipine Oral Capsules What is this medicine? NIFEDIPINE (nye FED i peen) is a calcium channel blocker. It relaxes your blood vessels and decreases the amount of work the heart has to do. It treats and/or prevents chest pain (also called angina). This medicine may be used for other purposes; ask your health care provider or pharmacist if you have questions. COMMON BRAND NAME(S): Adalat, Procardia What should I tell my health care provider before I take this medicine? They need to know if you have any of these conditions:  heart attack  heart disease  heart failure  high blood pressure  low blood pressure  an unusual or allergic reaction to nifedipine, other drugs, foods, dyes or preservatives  pregnant or trying to get pregnant  breast-feeding How should I use this medicine? Take this drug by mouth. Take it as directed on the prescription label at the same time every day. You can take it with or without food. If it upsets your stomach, take it with food. Keep taking it unless your health care provider tells you to stop. Do not take this drug with grapefruit juice. Talk to your health care provider about the use of this drug in children. Special care may be needed. Overdosage: If you think you have taken too much of this medicine contact a poison control center or emergency room at once. NOTE: This medicine is only for you. Do not share this medicine with others. What if I miss a dose? If you miss a dose, take it as soon as you can. If it is almost time for your next dose, take only that dose. Do not take double or extra doses. What may interact with this medicine? Do not take this medicine with any of the following medications:  certain medicines for seizures like carbamazepine, phenobarbital, phenytoin  lumacaftor; ivacaftor  rifabutin  rifampin  rifapentine  St. John's Wort This medicine may also interact with the following medications:  antiviral medicines for HIV  or AIDS  certain medicines for blood pressure  certain medicines for diabetes  certain medicines for erectile dysfunction  certain medicines for fungal infections like ketoconazole, fluconazole, and itraconazole  certain medicines for irregular heart beat like flecainide and quinidine  certain medicines that treat or prevent blood clots like warfarin  clarithromycin  digoxin  dolasetron  erythromycin  fluoxetine  grapefruit juice  local or general anesthetics  nefazodone  orlistat  quinupristin; dalfopristin  sirolimus  stomach acid blockers like cimetidine, ranitidine, omeprazole, or pantoprazole  tacrolimus  valproic acid This list may not describe all possible interactions. Give your health care provider a list of all the medicines, herbs, non-prescription drugs, or dietary supplements you use. Also tell them if you smoke, drink alcohol, or use illegal drugs. Some items may interact with your medicine. What should I watch for while using this medicine? Visit your health care provider for regular checks on your progress. Check your blood pressure as directed. Ask your health care provider what your blood pressure should be. Also, find out when you should contact him or her. Do not treat yourself for coughs, colds, or pain while you are using this drug without asking your health care provider for advice. Some drugs may increase your blood pressure. You may get drowsy or dizzy. Do not drive, use machinery, or do anything that needs mental alertness until you know how this drug affects you. Do not stand up or sit up quickly, especially if you are an older patient. This reduces the  risk of dizzy or fainting spells. What side effects may I notice from receiving this medicine? Side effects that you should report to your doctor or health care provider as soon as possible:  allergic reactions (skin rash, itching or hives; swelling of the face, lips, or tongue)  heart  attack (trouble breathing; pain or tightness in the chest, neck, back or arms; unusually weak or tired)  heart failure (trouble breathing; fast, irregular heartbeat; sudden weight gain; swelling of the ankles, feet, hands; unusually weak or tired)  low blood pressure (dizziness; feeling faint or lightheaded, falls; unusually weak or tired) Side effects that usually do not require medical attention (report to your doctor or health care provider if they continue or are bothersome):  changes in emotions or moods  cough  facial flushing  headache  heartburn (burning feeling in chest, often after eating or when lying down)  muscle cramps  nasal congestion (like runny or stuffy nose)  nausea  tremors This list may not describe all possible side effects. Call your doctor for medical advice about side effects. You may report side effects to FDA at 1-800-FDA-1088. Where should I keep my medicine? Keep out of the reach of children and pets. Store at room temperature between 15 and 25 degrees C (59 and 77 degrees F). Protect from light and moisture. Keep the container tightly closed. Throw away any unused drug after the expiration date. NOTE: This sheet is a summary. It may not cover all possible information. If you have questions about this medicine, talk to your doctor, pharmacist, or health care provider.  2020 Elsevier/Gold Standard (2019-04-14 19:08:10)   Hypertension, Adult Hypertension is another name for high blood pressure. High blood pressure forces your heart to work harder to pump blood. This can cause problems over time. There are two numbers in a blood pressure reading. There is a top number (systolic) over a bottom number (diastolic). It is best to have a blood pressure that is below 120/80. Healthy choices can help lower your blood pressure, or you may need medicine to help lower it. What are the causes? The cause of this condition is not known. Some conditions may be  related to high blood pressure. What increases the risk?  Smoking.  Having type 2 diabetes mellitus, high cholesterol, or both.  Not getting enough exercise or physical activity.  Being overweight.  Having too much fat, sugar, calories, or salt (sodium) in your diet.  Drinking too much alcohol.  Having long-term (chronic) kidney disease.  Having a family history of high blood pressure.  Age. Risk increases with age.  Race. You may be at higher risk if you are African American.  Gender. Men are at higher risk than women before age 6. After age 11, women are at higher risk than men.  Having obstructive sleep apnea.  Stress. What are the signs or symptoms?  High blood pressure may not cause symptoms. Very high blood pressure (hypertensive crisis) may cause: ? Headache. ? Feelings of worry or nervousness (anxiety). ? Shortness of breath. ? Nosebleed. ? A feeling of being sick to your stomach (nausea). ? Throwing up (vomiting). ? Changes in how you see. ? Very bad chest pain. ? Seizures. How is this treated?  This condition is treated by making healthy lifestyle changes, such as: ? Eating healthy foods. ? Exercising more. ? Drinking less alcohol.  Your health care provider may prescribe medicine if lifestyle changes are not enough to get your blood pressure under control, and  if: ? Your top number is above 130. ? Your bottom number is above 80.  Your personal target blood pressure may vary. Follow these instructions at home: Eating and drinking   If told, follow the DASH eating plan. To follow this plan: ? Fill one half of your plate at each meal with fruits and vegetables. ? Fill one fourth of your plate at each meal with whole grains. Whole grains include whole-wheat pasta, brown rice, and whole-grain bread. ? Eat or drink low-fat dairy products, such as skim milk or low-fat yogurt. ? Fill one fourth of your plate at each meal with low-fat (lean) proteins.  Low-fat proteins include fish, chicken without skin, eggs, beans, and tofu. ? Avoid fatty meat, cured and processed meat, or chicken with skin. ? Avoid pre-made or processed food.  Eat less than 1,500 mg of salt each day.  Do not drink alcohol if: ? Your doctor tells you not to drink. ? You are pregnant, may be pregnant, or are planning to become pregnant.  If you drink alcohol: ? Limit how much you use to:  0-1 drink a day for women.  0-2 drinks a day for men. ? Be aware of how much alcohol is in your drink. In the [place] one drink equals one 12 oz bottle of beer (355 mL), one 5 oz glass of wine (148 mL), or one 1 oz glass of hard liquor (44 mL). Lifestyle   Work with your doctor to stay at a healthy weight or to lose weight. Ask your doctor what the best weight is for you.  Get at least 30 minutes of exercise most days of the week. This may include walking, swimming, or biking.  Get at least 30 minutes of exercise that strengthens your muscles (resistance exercise) at least 3 days a week. This may include lifting weights or doing Pilates.  Do not use any products that contain nicotine or tobacco, such as cigarettes, e-cigarettes, and chewing tobacco. If you need help quitting, ask your doctor.  Check your blood pressure at home as told by your doctor.  Keep all follow-up visits as told by your doctor. This is important. Medicines  Take over-the-counter and prescription medicines only as told by your doctor. Follow directions carefully.  Do not skip doses of blood pressure medicine. The medicine does not work as well if you skip doses. Skipping doses also puts you at risk for problems.  Ask your doctor about side effects or reactions to medicines that you should watch for. Contact a doctor if you:  Think you are having a reaction to the medicine you are taking.  Have headaches that keep coming back (recurring).  Feel dizzy.  Have swelling in your ankles.  Have trouble  with your vision. Get help right away if you:  Get a very bad headache.  Start to feel mixed up (confused).  Feel weak or numb.  Feel faint.  Have very bad pain in your: ? Chest. ? Belly (abdomen).  Throw up more than once.  Have trouble breathing. Summary  Hypertension is another name for high blood pressure.  High blood pressure forces your heart to work harder to pump blood.  For most people, a normal blood pressure is less than 120/80.  Making healthy choices can help lower blood pressure. If your blood pressure does not get lower with healthy choices, you may need to take medicine. This information is not intended to replace advice given to you by your health care provider.  Make sure you discuss any questions you have with your health care provider. Document Revised: 03/19/2018 Document Reviewed: 03/19/2018 Elsevier Patient Education  2020 [employer].

## 2020-02-10 ENCOUNTER — Ambulatory Visit: Payer: Medicaid Other | Admitting: Allergy

## 2020-02-10 DIAGNOSIS — J302 Other seasonal allergic rhinitis: Secondary | ICD-10-CM | POA: Insufficient documentation

## 2020-02-10 NOTE — Progress Notes (Deleted)
Follow Up Note  RE: Kristen Chung MRN: [id number] DOB: 08-Oct-1985 Date of Office Visit: 02/10/2020  Referring provider: Sharmon Leyden* Primary care provider: Doreen Beam, FNP  Chief Complaint: No chief complaint on file.  History of Present Illness: I had the pleasure of seeing Kristen Chung for a follow up visit at the [facility] on 02/10/2020. She is a 34 y.o. female, who is being followed for pruritus, adverse food reaction, allergic rhinoconjunctivitis and wheezing. Her previous allergy office visit was on 11/09/2019 with Dr. Maudie Mercury. Today is a regular follow up visit.  I reviewed the bloodwork. Blood count, kidney function, liver function, electrolytes, thyroid, autoimmune screener, chronic urticaria index (checks for autoantibodies that trigger mast cells) and alpha gal (checks for red meat allergy) were all normal which is great. Food panel was negative as well.   Given these test results, not sure what exactly is triggering your symptoms.  Start taking zyrtec 33m daily. Keep a food journal with your symptoms.  Start proper skin care.  Continue to avoid foods that seem to bother you - strawberry, seafood, shellfish.  Pruritus Patient concerned about environmental and food allergies as she has been experiencing pruritus and rashes 20 to 30 minutes after eating.  Sometimes she can eat the same food with no issues.  Noted that strawberries, seafood, shellfish and certain spices seem to trigger this more.  No previous allergy evaluation.  Today's skin testing showed: Positive to grass, mold, cat. Borderline to oats.  Given these test results, not sure what exactly is triggering her symptoms.   Start taking zyrtec 33mdaily.  Keep a food journal with your symptoms.   Start proper skin care as below.  Get bloodwork to rule out other etiologies.   Avoid foods that seem to bother you - strawberry, seafood, shellfish.  For mild symptoms you  can take over the counter antihistamines such as Benadryl and monitor symptoms closely. If symptoms worsen or if you have severe symptoms including breathing issues, throat closure, significant swelling, whole body hives, severe diarrhea and vomiting, lightheadedness then inject epinephrine and seek immediate medical care afterwards.  Food action plan given.   Adverse food reaction See assessment and plan as above.  Today's skin testing showed: Positive to grass, mold, cat. Borderline to oats.  Given these test results, not sure what exactly is triggering her symptoms.   Start taking zyrtec 331maily.  Keep a food journal with your symptoms.   Avoid foods that seem to bother you - strawberry, seafood, shellfish.  For mild symptoms you can take over the counter antihistamines such as Benadryl and monitor symptoms closely. If symptoms worsen or if you have severe symptoms including breathing issues, throat closure, significant swelling, whole body hives, severe diarrhea and vomiting, lightheadedness then inject epinephrine and seek immediate medical care afterwards.  Food action plan given.   Other allergic rhinitis Rhinoconjunctivitis symptoms the last few weeks.  Does not take medications for this and no previous allergy testing.  Today's skin testing showed: Positive to grass, mold, cat.   Start environmental allergy control measures.   Start zyrtec 87m67mily.  May use Flonase 1 spray per nostril daily as needed for sinus symptoms.  May use olopatadine eye drops 0.2% once a day as needed for itchy/watery eyes.  Wheezing No history of asthma but noticed wheezing at times. Not sure if it's coming from chest or her nose.  Today's spirometry was normal.  Monitor  symptoms. If still having issues then will prescribe albuterol next.   Return in about 3 months (around 02/08/2020).  Assessment and Plan: Kristen Chung is a 34 y.o. female with: No problem-specific Assessment & Plan  notes found for this encounter.  No follow-ups on file.  No orders of the defined types were placed in this encounter.  Lab Orders  No laboratory test(s) ordered today    Diagnostics: Spirometry:  Tracings reviewed. Her effort: {Blank single:19197::"Good reproducible efforts.","It was hard to get consistent efforts and there is a question as to whether this reflects a maximal maneuver.","Poor effort, data can not be interpreted."} FVC: ***L FEV1: ***L, ***% predicted FEV1/FVC ratio: ***% Interpretation: {Blank single:19197::"Spirometry consistent with mild obstructive disease","Spirometry consistent with moderate obstructive disease","Spirometry consistent with severe obstructive disease","Spirometry consistent with possible restrictive disease","Spirometry consistent with mixed obstructive and restrictive disease","Spirometry uninterpretable due to technique","Spirometry consistent with normal pattern","No overt abnormalities noted given today's efforts"}.  Please see scanned spirometry results for details.  Skin Testing: {Blank single:19197::"Select foods","Environmental allergy panel","Environmental allergy panel and select foods","Food allergy panel","None","Deferred due to recent antihistamines use"}. Positive test to: ***. Negative test to: ***.  Results discussed with patient/family.   Medication List:  Current Outpatient Medications  Medication Sig Dispense Refill  . cetirizine (ZYRTEC ALLERGY) 10 MG tablet Take 1 tablet (10 mg total) by mouth daily. 30 tablet 5  . diphenhydrAMINE (BENADRYL) 25 mg capsule Take 1 capsule (25 mg total) by mouth every 6 (six) hours as needed. 30 capsule 0  . EPINEPHrine 0.3 mg/0.3 mL IJ SOAJ injection Inject 0.3 mLs (0.3 mg total) into the muscle as needed for anaphylaxis. 1 each 1  . fluticasone (FLONASE) 50 MCG/ACT nasal spray Place 1-2 sprays into both nostrils daily. 16 g 5  . MECLIZINE HCL PO Take by mouth.    Marland Kitchen NIFEdipine (ADALAT CC) 30 MG  24 hr tablet Take 1 tablet (30 mg total) by mouth in the morning and at bedtime. 60 tablet 2  . Olopatadine HCl 0.2 % SOLN Apply 1 drop to eye daily as needed. 2.5 mL 5  . pantoprazole (PROTONIX) 40 MG tablet Take 1 tablet (40 mg total) by mouth daily. 30 tablet 0   No current facility-administered medications for this visit.   Allergies: Allergies  Allergen Reactions  . Celery Oil Itching  . Codone [Hydrocodone] Itching  . Dilaudid [Hydromorphone Hcl] Itching  . Hydromorphone Hcl Itching  . Other     Cats  . Strawberry Extract Itching  . Tramadol Itching   I reviewed her past medical history, social history, family history, and environmental history and no significant changes have been reported from her previous visit.  Review of Systems  Constitutional: Negative for appetite change, fever and unexpected weight change.  HENT: Positive for congestion and rhinorrhea.   Eyes: Positive for itching.  Respiratory: Positive for wheezing. Negative for cough, chest tightness and shortness of breath.   Cardiovascular: Negative for chest pain.  Gastrointestinal: Negative for abdominal pain.  Genitourinary: Negative for difficulty urinating.  Skin: Positive for rash.  Allergic/Immunologic: Positive for environmental allergies.   Objective: There were no vitals taken for this visit. There is no height or weight on file to calculate BMI. Physical Exam Vitals and nursing note reviewed.  Constitutional:      Appearance: She is well-developed.  HENT:     Head: Normocephalic and atraumatic.     Right Ear: External ear normal.     Left Ear: External ear normal.     Nose:  Nose normal.  Eyes:     Conjunctiva/sclera: Conjunctivae normal.  Cardiovascular:     Rate and Rhythm: Normal rate and regular rhythm.     Heart sounds: Normal heart sounds. No murmur heard.  No friction rub. No gallop.   Pulmonary:     Effort: Pulmonary effort is normal.     Breath sounds: Normal breath sounds. No  wheezing or rales.  Abdominal:     Palpations: Abdomen is soft.  Musculoskeletal:     Cervical back: Neck supple.  Skin:    General: Skin is warm.     Findings: No rash.  Neurological:     Mental Status: She is alert and oriented to person, place, and time.  Psychiatric:        Behavior: Behavior normal.    Previous notes and tests were reviewed. The plan was reviewed with the patient/family, and all questions/concerned were addressed.  It was my pleasure to see Kristen Chung today and participate in her care. Please feel free to contact me with any questions or concerns.  Sincerely,  Rexene Alberts, DO Allergy & Immunology  [facility] office: [phone] [facility] office: [place] office: [phone]

## 2020-02-12 ENCOUNTER — Encounter: Payer: Self-pay | Admitting: Certified Nurse Midwife

## 2020-02-12 ENCOUNTER — Ambulatory Visit (INDEPENDENT_AMBULATORY_CARE_PROVIDER_SITE_OTHER): Payer: Medicaid Other | Admitting: Certified Nurse Midwife

## 2020-02-12 ENCOUNTER — Other Ambulatory Visit: Payer: Self-pay

## 2020-02-12 VITALS — BP 114/85 | HR 91 | Ht 64.0 in | Wt 171.3 lb

## 2020-02-12 DIAGNOSIS — Z3009 Encounter for other general counseling and advice on contraception: Secondary | ICD-10-CM

## 2020-02-12 DIAGNOSIS — Z30430 Encounter for insertion of intrauterine contraceptive device: Secondary | ICD-10-CM | POA: Diagnosis not present

## 2020-02-12 DIAGNOSIS — Z975 Presence of (intrauterine) contraceptive device: Secondary | ICD-10-CM | POA: Diagnosis not present

## 2020-02-12 DIAGNOSIS — Z0130 Encounter for examination of blood pressure without abnormal findings: Secondary | ICD-10-CM

## 2020-02-12 LAB — POCT URINE PREGNANCY: Preg Test, Ur: NEGATIVE

## 2020-02-12 NOTE — Patient Instructions (Signed)
IUD PLACEMENT POST-PROCEDURE INSTRUCTIONS  1. You may take Ibuprofen, Aleve or Tylenol for pain if needed.  Cramping should resolve within in 24 hours.  2. You may have a small amount of spotting.  You should wear a mini pad for the next few days.  3. You may have intercourse after 72 hours.  If you using this for birth control, it is effective immediately.  4. You need to call if you have any pelvic pain, fever, heavy bleeding or foul smelling vaginal discharge.  Irregular bleeding is common the first several months after having an IUD placed. You do not need to call for this reason unless you are concerned.  5. Shower or bathe as normal  6. You should have a follow-up appointment in 4-8 weeks for a re-check to make sure you are not having any problems.   Levonorgestrel intrauterine device (IUD) What is this medicine? LEVONORGESTREL IUD (LEE voe nor jes trel) is a contraceptive (birth control) device. The device is placed inside the uterus by a healthcare professional. It is used to prevent pregnancy. This device can also be used to treat heavy bleeding that occurs during your period. This medicine may be used for other purposes; ask your health care provider or pharmacist if you have questions. COMMON BRAND NAME(S): Minette Headland What should I tell my health care provider before I take this medicine? They need to know if you have any of these conditions:  abnormal Pap smear  cancer of the breast, uterus, or cervix  diabetes  endometritis  genital or pelvic infection now or in the past  have more than one sexual partner or your partner has more than one partner  heart disease  history of an ectopic or tubal pregnancy  immune system problems  IUD in place  liver disease or tumor  problems with blood clots or take blood-thinners  seizures  use intravenous drugs  uterus of unusual shape  vaginal bleeding that has not been explained  an unusual or  allergic reaction to levonorgestrel, other hormones, silicone, or polyethylene, medicines, foods, dyes, or preservatives  pregnant or trying to get pregnant  breast-feeding How should I use this medicine? This device is placed inside the uterus by a health care professional. Talk to your pediatrician regarding the use of this medicine in children. Special care may be needed. Overdosage: If you think you have taken too much of this medicine contact a poison control center or emergency room at once. NOTE: This medicine is only for you. Do not share this medicine with others. What if I miss a dose? This does not apply. Depending on the brand of device you have inserted, the device will need to be replaced every 3 to 6 years if you wish to continue using this type of birth control. What may interact with this medicine? Do not take this medicine with any of the following medications:  amprenavir  bosentan  fosamprenavir This medicine may also interact with the following medications:  aprepitant  armodafinil  barbiturate medicines for inducing sleep or treating seizures  bexarotene  boceprevir  griseofulvin  medicines to treat seizures like carbamazepine, ethotoin, felbamate, oxcarbazepine, phenytoin, topiramate  modafinil  pioglitazone  rifabutin  rifampin  rifapentine  some medicines to treat HIV infection like atazanavir, efavirenz, indinavir, lopinavir, nelfinavir, tipranavir, ritonavir  St. John's wort  warfarin This list may not describe all possible interactions. Give your health care provider a list of all the medicines, herbs, non-prescription drugs, or  dietary supplements you use. Also tell them if you smoke, drink alcohol, or use illegal drugs. Some items may interact with your medicine. What should I watch for while using this medicine? Visit your doctor or health care professional for regular check ups. See your doctor if you or your partner has sexual  contact with others, becomes HIV positive, or gets a sexual transmitted disease. This product does not protect you against HIV infection (AIDS) or other sexually transmitted diseases. You can check the placement of the IUD yourself by reaching up to the top of your vagina with clean fingers to feel the threads. Do not pull on the threads. It is a good habit to check placement after each menstrual period. Call your doctor right away if you feel more of the IUD than just the threads or if you cannot feel the threads at all. The IUD may come out by itself. You may become pregnant if the device comes out. If you notice that the IUD has come out use a backup birth control method like condoms and call your health care provider. Using tampons will not change the position of the IUD and are okay to use during your period. This IUD can be safely scanned with magnetic resonance imaging (MRI) only under specific conditions. Before you have an MRI, tell your healthcare provider that you have an IUD in place, and which type of IUD you have in place. What side effects may I notice from receiving this medicine? Side effects that you should report to your doctor or health care professional as soon as possible:  allergic reactions like skin rash, itching or hives, swelling of the face, lips, or tongue  fever, flu-like symptoms  genital sores  high blood pressure  no menstrual period for 6 weeks during use  pain, swelling, warmth in the leg  pelvic pain or tenderness  severe or sudden headache  signs of pregnancy  stomach cramping  sudden shortness of breath  trouble with balance, talking, or walking  unusual vaginal bleeding, discharge  yellowing of the eyes or skin Side effects that usually do not require medical attention (report to your doctor or health care professional if they continue or are bothersome):  acne  breast pain  change in sex drive or performance  changes in  weight  cramping, dizziness, or faintness while the device is being inserted  headache  irregular menstrual bleeding within first 3 to 6 months of use  nausea This list may not describe all possible side effects. Call your doctor for medical advice about side effects. You may report side effects to FDA at 1-800-FDA-1088. Where should I keep my medicine? This does not apply. NOTE: This sheet is a summary. It may not cover all possible information. If you have questions about this medicine, talk to your doctor, pharmacist, or health care provider.  2020 Elsevier/Gold Standard (2018-05-20 13:22:01)

## 2020-02-14 DIAGNOSIS — Z975 Presence of (intrauterine) contraceptive device: Secondary | ICD-10-CM | POA: Insufficient documentation

## 2020-02-14 MED ORDER — NIFEDIPINE ER 30 MG PO TB24: 30.0000 mg | ORAL_TABLET | Freq: Two times a day (BID) | ORAL | 5 refills | Status: DC
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
## 2020-02-25 ENCOUNTER — Ambulatory Visit (INDEPENDENT_AMBULATORY_CARE_PROVIDER_SITE_OTHER): Payer: Medicaid Other | Admitting: Adult Health

## 2020-02-25 ENCOUNTER — Encounter: Payer: Self-pay | Admitting: Adult Health

## 2020-02-25 ENCOUNTER — Other Ambulatory Visit: Payer: Self-pay

## 2020-02-25 VITALS — BP 132/90 | HR 78 | Temp 98.4°F | Resp 14 | Ht 64.0 in | Wt 178.0 lb

## 2020-02-25 DIAGNOSIS — R5383 Other fatigue: Secondary | ICD-10-CM | POA: Diagnosis not present

## 2020-02-25 DIAGNOSIS — Z975 Presence of (intrauterine) contraceptive device: Secondary | ICD-10-CM

## 2020-02-25 DIAGNOSIS — K219 Gastro-esophageal reflux disease without esophagitis: Secondary | ICD-10-CM

## 2020-02-25 DIAGNOSIS — F1410 Cocaine abuse, uncomplicated: Secondary | ICD-10-CM

## 2020-02-25 DIAGNOSIS — R140 Abdominal distension (gaseous): Secondary | ICD-10-CM

## 2020-02-25 DIAGNOSIS — Z8719 Personal history of other diseases of the digestive system: Secondary | ICD-10-CM

## 2020-02-25 DIAGNOSIS — R635 Abnormal weight gain: Secondary | ICD-10-CM | POA: Diagnosis not present

## 2020-02-25 DIAGNOSIS — K50919 Crohn's disease, unspecified, with unspecified complications: Secondary | ICD-10-CM

## 2020-02-25 DIAGNOSIS — Z7289 Other problems related to lifestyle: Secondary | ICD-10-CM

## 2020-02-25 DIAGNOSIS — R45851 Suicidal ideations: Secondary | ICD-10-CM

## 2020-02-25 DIAGNOSIS — Z716 Tobacco abuse counseling: Secondary | ICD-10-CM

## 2020-02-25 DIAGNOSIS — K580 Irritable bowel syndrome with diarrhea: Secondary | ICD-10-CM

## 2020-02-25 DIAGNOSIS — Z789 Other specified health status: Secondary | ICD-10-CM

## 2020-02-25 DIAGNOSIS — F1090 Alcohol use, unspecified, uncomplicated: Secondary | ICD-10-CM

## 2020-02-25 DIAGNOSIS — F1210 Cannabis abuse, uncomplicated: Secondary | ICD-10-CM

## 2020-02-25 MED ORDER — ESOMEPRAZOLE MAGNESIUM 20 MG PO CPDR: 20.0000 mg | DELAYED_RELEASE_CAPSULE | Freq: Every day | ORAL | 0 refills | Status: DC
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
## 2020-02-25 NOTE — Patient Instructions (Addendum)
Alcohol Use Education Information about Your Drinking Your score on the Alcohol Use Disorders Identification Test was: AUDIT C:    TOTAL AUDIT SCORE:   .  This score places you in the category of:  Score 0 = Abstainers Score 8-19 = Unhealthy/High Risk Drinkers  Score 1-7 = Low Risk Drinkers Score 20+ = Probable Alcohol Dependence   High Scores (20+) on the Alcohol Use Identification Test Consider becoming involved in a structured program.  You should stop drinking if: . You have tried to cut down before but have not been successful, or  . You suffer from morning shakes during a heavy drinking period, or . You have high blood pressure, or . You are pregnant, or . You have liver disease, or . You are taking medicines that react with alcohol, or . Your alcohol use is affecting your social relationships, or . You have legal consequences like DUIs, or . You call in sick to work, or . You cannot take care of our children, or . Someone close to you says you drink too much    How Much Alcohol is a Drink: Beer: 12 oz. = 1 drink 16 oz. = 1.3 drinks 22 oz. = 2 drinks 40 oz. = 3.3 drinks  Wine: 5 oz. = 1 drink 740 mL (25 oz.) bottle = 5 drinks Malt Liquor: 12 oz. = 1.5 drinks 16 oz. = 2 drinks 22 oz. = 2.5 drinks 40 oz. = 4.5 drinks  80-Proof Spirits - Hard Liquor: 1 shot = 1 drink 1 mixed drink = number of shots Can equal 1-3 drinks   What is Low-risk Drinking? . Have no more than 2 drinks of alcohol per day . Drink no more than 5 days per week . Do not drink alcohol drink alcohol when: - You drive or operate machinery - You are pregnant or breast feeding - You are taking medications that interact with alcohol - You have medical conditions made worse with alcohol - You can stop or control your drinking      Identify Your Triggers for Drinking . Parties . Particular People . Feeling lonely . Feeling tense . Family problems . Feeling sad . Feeling happy  . Feeling bored . After work . Problems sleeping . Criticism . Feelings of failure . After being paid . When others are drinking . In bars . When out for dinner . After arguments . Weekends . Feeling restless . Being in pain   Effects of High-Risk Drinking To the Brain: . Aggressive, irrational behavior . Arguments, violence . Depression, nervousness . Alcohol dependence, memory loss To the Nervous System: . Trembling hands, tingling fingers . Numbness, painful nerves . Impaired sensation leading to falls . Numb tingling toes To Your Lifestyle: . Social, legal, medical problems . Domestic trouble/relationship loss . Job loss & financial problems . Shortened life span . Accidents and death from drunk driving   To the Face: . Premature aging, drinker's nose . Cancer of the throat & mouth To the Body: . Frequent cold . Reduced resistance to infection . Increased risk of pneumonia . Weakness of heart muscle . Heart failure, anemia . Impaired blood clotting . Breast cancer . Vitamin deficiency, bleeding . Severe Inflammation of the stomach . Vomiting, diarrhea, malnutrition . Ulcer, inflammation of the pancreas . Impaired sexual performance . Birth defects, including deformities, retardation, and low birthweight   Ways to Cope Without Drinking . Go home if you tend to drink after work . Find another  activity . Switch to nonalcoholic beverages . Change friends . Join a club . Volunteer . Visit relatives . Plan/take a trip . Go for a walk . Take up a hobby . Listen to music . Talk to a friend . Reading . What would you do if you had no worries about failing?         Good Reasons for Drinking Less . I will live longer - probably 8-10 years. . I will sleep better. . I will be happier. . I will save a lot of money . My relationships will improve. . I will stay younger for longer. . I will achieve more in my life . There will be a greater chance that  I will survive to a healthy old age with no premature damage to my brain.  . I will be better at my job. . I will be less likely to feel depressed and commit suicide (6 times less likely). . I will be less likely to die of heart disease or cancer. . Other people will respect me . I will be less likely to get into trouble with the police. . The possibility that I will die of liver disease will be dramatically reduced (12 times less likely). . It will be less likely that I will die in a car accident (3 times less likely).   Strategies for Cutting Down Keep Track.  Find a way to keep track of how much you drink.  If you make a note of each drink before you drink it, this will help slow you down. Count and Measure.  Know the standard drink sizes.  Ask the bartender or server about the amount of alcohol in a mixed drink. Set Goals.  Decide how many days a week you will drink and how many drinks each day. Pace and Space.  When you do drink, pace yourself.  Have no more than one drink with alcohol per hour.  Alternate "drink spacers" non-alcoholic drinks such as water, soda, or juice with drinks containing alcohol. Include Food.  Don't drink on an empty stomach.  Have some food so the alcohol will be absorbed more slowly into your system.  Avoid Triggers.  Avoid people, places, or activities that have led to drinking in the past.  Certain times of day or feelings may also be triggers.  Make a plan so you will know what you can do instead of drinking. Plan to Handle Urges.  When an urge hits, consider these options:  Remind yourself of your reasons for changing.  Or talk it through with someone you trust. Or get involved with a healthy, distracting activity.  Or, "urge surf" - instead of fighting the feeling, accept it and ride it out, knowing it will soon crest like a wave and pass. Know Your "No".  Have a polite, convincing "no thanks" for those times when you may be offered a drink and don't want one.   The faster you can say no to these offers, the less likely you are to give in.  If you hesitate, it allows you time to think of excuses to go along.       Fatigue If you have fatigue, you feel tired all the time and have a lack of energy or a lack of motivation. Fatigue may make it difficult to start or complete tasks because of exhaustion. In general, occasional or mild fatigue is often a normal response to activity or life. However, long-lasting (chronic) or extreme fatigue  may be a symptom of a medical condition. Follow these instructions at home: General instructions  Watch your fatigue for any changes.  Go to bed and get up at the same time every day.  Avoid fatigue by pacing yourself during the day and getting enough sleep at night.  Maintain a healthy weight. Medicines  Take over-the-counter and prescription medicines only as told by your health care provider.  Take a multivitamin, if told by your health care provider.  Do not use herbal or dietary supplements unless they are approved by your health care provider. Activity   Exercise regularly, as told by your health care provider.  Use or practice techniques to help you relax, such as yoga, tai chi, meditation, or massage therapy. Eating and drinking   Avoid heavy meals in the evening.  Eat a well-balanced diet, which includes lean proteins, whole grains, plenty of fruits and vegetables, and low-fat dairy products.  Avoid consuming too much caffeine.  Avoid the use of alcohol.  Drink enough fluid to keep your urine pale yellow. Lifestyle  Change situations that cause you stress. Try to keep your work and personal schedule in balance.  Do not use any products that contain nicotine or tobacco, such as cigarettes and e-cigarettes. If you need help quitting, ask your health care provider.  Do not use drugs. Contact a health care provider if:  Your fatigue does not get better.  You have a fever.  You  suddenly lose or gain weight.  You have headaches.  You have trouble falling asleep or sleeping through the night.  You feel angry, guilty, anxious, or sad.  You are unable to have a bowel movement (constipation).  Your skin is dry.  You have swelling in your legs or another part of your body. Get help right away if:  You feel confused.  Your vision is blurry.  You feel faint or you pass out.  You have a severe headache.  You have severe pain in your abdomen, your back, or the area between your waist and hips (pelvis).  You have chest pain, shortness of breath, or an irregular or fast heartbeat.  You are unable to urinate, or you urinate less than normal.  You have abnormal bleeding, such as bleeding from the rectum, vagina, nose, lungs, or nipples.  You vomit blood.  You have thoughts about hurting yourself or others. If you ever feel like you may hurt yourself or others, or have thoughts about taking your own life, get help right away. You can go to your nearest emergency department or call:  Your local emergency services (911 in the [place]  A suicide crisis helpline, such as the [place] at (865)518-6322. This is open 24 hours a day. Summary  If you have fatigue, you feel tired all the time and have a lack of energy or a lack of motivation.  Fatigue may make it difficult to start or complete tasks because of exhaustion.  Long-lasting (chronic) or extreme fatigue may be a symptom of a medical condition.  Exercise regularly, as told by your health care provider.  Change situations that cause you stress. Try to keep your work and personal schedule in balance. This information is not intended to replace advice given to you by your health care provider. Make sure you discuss any questions you have with your health care provider. Document Revised: 01/28/2019 Document Reviewed: 04/03/2017 Elsevier Patient Education  2020 [place].  What You Need to Know About  Marijuana Use Marijuana is a mixture of the dried leaves and flowers of the hemp plant Cannabis sativa. The plant's active ingredients (cannabinoids) change the chemistry of the brain. If you smoke or eat marijuana, you will experience changes in the way you think, feel, and behave. Many people use marijuana because it helps them relax and puts them in a pleasurable mood (marijuana high). Some people use marijuana for medical effects, such as:  Reduced nausea.  Increased appetite.  Reduced muscle spasm.  Pain relief. Researchers are studying other possible medical uses for marijuana. How can marijuana use affect me? Many people find a marijuana high to be pleasurable and relaxing. Other people find a marijuana high to be uncomfortable or anxiety-causing. This drug can cause short-term and long-term physical and mental effects. Taking high doses of marijuana or trying to quit marijuana can also affect you. Short-term effects of marijuana use include:  Temporary relief of symptoms from a medical condition.  Changes in mood and perception (feeling high).  Increased hunger.  Increased heart rate.  Slowed movement and reaction time.  Poor memory, judgment, and problem solving ability.  Altered sense of time.  Changes to vision.  Bloodshot eyes.  Coughing. Long-term effects of marijuana use include:  Higher risk of lung and breathing problems.  Higher risk of heart attack.  Higher risk of testicular cancer.  Mental and physical dependence (addiction).  Slowed brain development in young people. Babies whose mothers used marijuana during pregnancy have an increased risk of problems with brain development and behavior.  Temporary periods of false perceptions or beliefs (hallucinations or paranoia).  Worsening of mental illness.  Onset of new mental illness such as anxiety, depression, or suicidal thoughts.  Withdrawal symptoms when  stopping marijuana, such as sleeplessness, anxiety, cravings, and anger.  Difficulty maintaining healthy relationships.  Poor memory, and difficulty concentrating and learning. This can result in decreased intelligence and poor performance at school or work, and an increased risk of dropping out of school.  Higher risk of using other substances like alcohol and nicotine. High doses of marijuana can cause:  Panic.  Anxiety.  Mental confusion.  Hallucinations. Quitting marijuana after using it for a long time can cause withdrawal symptoms, such as:  Headache.  Shakiness.  Sweating.  Stomach pain.  Nausea.  Restlessness.  Irritability.  Trouble sleeping.  Decreased appetite. What are the benefits of not using marijuana? Not using marijuana can keep you from becoming dependent on it. You can avoid the negative effects of the drug that can reduce your quality of life. You can avoid accidents caused by the slowed reaction time that is common with marijuana use. If I already use marijuana, what steps can I take to stop using it? If you are not physically or mentally dependent on marijuana, you should be able to stop using it on your own. If you cannot stop on your own, ask your health care provider for help. Treatment for marijuana addiction is similar to treatment for other addictions. It may include:  Cognitive-behavioral therapy (psychotherapy). This may include individual or group therapy.  Joining a support group.  Treating medical, behavioral, or mental health conditions that exist along with marijuana dependency.  Where can I get more information? Learn more about:  Marijuana from the [place] on Drug Abuse: MissingBag.si  Medical marijuana from the [place]: LocatorExpress.is  Treatment options from the Substance Abuse and [place]:  https://findtreatment.[employer].com.au  Recovery from marijuana dependency from Recovery.org:  http://www.recovery.org/topics/marijuana-recovery When should I seek medical care? Talk with your health care provider if:  You want to stop using marijuana but you cannot.  You have withdrawal symptoms when you try to stop using marijuana.  You are using marijuana every day.  You are using marijuana along with other drugs like cocaine or alcohol.  You have anxiety or depression.  You have hallucinations or paranoia.  Marijuana use is interfering with your relationships or your ability to function normally at school or at work. Summary  You may become physically or mentally dependent on marijuana.  Long-term use may interfere with your ability to function normally at home, school, or work.  Marijuana addiction is treatable. This information is not intended to replace advice given to you by your health care provider. Make sure you discuss any questions you have with your health care provider. Document Revised: 06/21/2017 Document Reviewed: 03/28/2016 Elsevier Patient Education  2020 [employer].  Alcohol Abuse and Nutrition Alcohol abuse is any pattern of alcohol consumption that harms your health, relationships, or work. Alcohol abuse can cause poor nutrition (malnutrition or malnourishment) and a lack of nutrients (nutrient deficiencies), which can lead to more complications. Alcohol abuse brings malnutrition and nutrient deficiencies in two ways:  It causes your liver to work abnormally. This affects how your body divides (breaks down) and absorbs nutrients from food.  It causes you to eat poorly. Many people who abuse alcohol do not eat enough carbohydrates, protein, fat, vitamins, and minerals. Nutrients that are commonly lacking (deficient) in people who abuse alcohol include:  Vitamins. ? Vitamin A. This is needed for your vision, metabolism, and ability to fight off infections  (immunity). ? B vitamins. These include folate, thiamine, and niacin. These are needed for new cell growth. ? Vitamin C. This plays an important role in wound healing, immunity, and helping your body to absorb iron. ? Vitamin D. This is necessary for your body to absorb and use calcium. It is produced by your liver, but you can also get it from food and from sun exposure.  Minerals. ? Calcium. This is needed for healthy bones as well as heart and blood vessel (cardiovascular) function. ? Iron. This is important for blood, muscle, and nervous system functioning. ? Magnesium. This plays an important role in muscle and nerve function, and it helps to control blood sugar and blood pressure. ? Zinc. This is important for the normal functioning of your nervous system and digestive system (gastrointestinal tract). If you think that you have an alcohol dependency problem, or if it is hard to stop drinking because you feel sick or different when you do not use alcohol, talk with your health care provider or another health professional about where to get help. Nutrition is an essential factor in therapy for alcohol abuse. Your health care provider or diet and nutrition specialist (dietitian) will work with you to design a plan that can help to restore nutrients to your body and prevent the risk of complications. What is my plan? Your dietitian may develop a specific eating plan that is based on your condition and any other problems that you have. An eating plan will commonly include:  A balanced diet. ? Grains: 6-8 oz (170-227 g) a day. Examples of 1 oz of whole grains include 1 cup of whole-wheat cereal,  cup of brown rice, or 1 slice of whole-wheat bread. ? Vegetables: 2-3 cups a day. Examples of 1 cup of vegetables include 2 medium carrots, 1 large tomato,  or 2 stalks of celery. ? Fruits: 1-2 cups a day. Examples of 1 cup of fruit include 1 large banana, 1 small apple, 8 large strawberries, or 1 large  orange. ? Meat and other protein: 5-6 oz (142-170 g) a day.  A cut of meat or fish that is the size of a deck of cards is about 3-4 oz.  Foods that provide 1 oz of protein include 1 egg,  cup of nuts or seeds, or 1 tablespoon (16 g) of peanut butter. ? Dairy: 2-3 cups a day. Examples of 1 cup of dairy include 8 oz (230 mL) of milk, 8 oz (230 g) of yogurt, or 1 oz (44 g) of natural cheese.  Vitamin and mineral supplements. What are tips for following this plan?  Eat frequent meals and snacks. Try to eat 5-6 small meals each day.  Take vitamin or mineral supplements as recommended by your dietitian.  If you are malnourished or if your dietitian recommends it: ? You may follow a high-protein, high-calorie diet. This may include:  2,000-3,000 calories (kilocalories) a day.  70-100 g (grams) of protein a day. ? You may be directed to follow a diet that includes a complete nutritional supplement beverage. This can help to restore calories, protein, and vitamins to your body. Depending on your condition, you may be advised to consume this beverage instead of your meals or in addition to them.  Certain medicines may cause changes in your appetite, taste, and weight. Work with your health care provider and dietitian to make any changes to your medicines and eating plan.  If you are unable to take in enough food and calories by mouth, your health care provider may recommend a feeding tube. This tube delivers nutritional supplements directly to your stomach. Recommended foods  Eat foods that are high in molecules that prevent oxygen from reacting with your food (antioxidants). These foods include grapes, berries, nuts, green tea, and dark green or orange vegetables. Eating these can help to prevent some of the stress that is placed on your liver by consuming alcohol.  Eat a variety of fresh fruits and vegetables each day. This will help you to get fiber and vitamins in your diet.  Drink plenty  of water and other clear fluids, such as apple juice and broth. Try to drink at least 48-64 oz (1.5-2 L) of water a day.  Include foods fortified with vitamins and minerals in your diet. Commonly fortified foods include milk, orange juice, cereal, and bread.  Eat a variety of foods that are high in omega-3 and omega-6 fatty acids. These include fish, nuts and seeds, and soybeans. These foods may help your liver to recover and may also stabilize your mood.  If you are a vegetarian: ? Eat a variety of protein-rich foods. ? Pair whole grains with plant-based proteins at meals and snack time. For example, eat rice with beans, put peanut butter on whole-grain toast, or eat oatmeal with sunflower seeds. The items listed above may not be a complete list of foods and beverages you can eat. Contact a dietitian for more information. Foods to avoid  Avoid foods and drinks that are high in fat and sugar. Sugary drinks, salty snacks, and candy contain empty calories. This means that they lack important nutrients such as protein, fiber, and vitamins.  Avoid alcohol. This is the best way to avoid malnutrition due to alcohol abuse. If you must drink, drink measured amounts. Measured drinking means limiting your intake to no  more than 1 drink a day for nonpregnant women and 2 drinks a day for men. One drink equals 12 oz (355 mL) of beer, 5 oz (148 mL) of wine, or 1 oz (44 mL) of hard liquor.  Limit your intake of caffeine. Replace drinks like coffee and black tea with decaffeinated coffee and decaffeinated herbal tea. The items listed above may not be a complete list of foods and beverages you should avoid. Contact a dietitian for more information. Summary  Alcohol abuse can cause poor nutrition (malnutrition or malnourishment) and a lack of nutrients (nutrient deficiencies), which can lead to more health problems.  Common nutrient deficiencies include vitamin deficiencies (A, B, C, and D) and mineral  deficiencies (calcium, iron, magnesium, and zinc).  Nutrition is an essential factor in therapy for alcohol abuse.  Your health care provider and dietitian can help you to develop a specific eating plan that includes a balanced diet plus vitamin and mineral supplements. This information is not intended to replace advice given to you by your health care provider. Make sure you discuss any questions you have with your health care provider. Document Revised: 10/28/2018 Document Reviewed: 03/26/2017 Elsevier Patient Education  Kristen Chung.     Stimulant Use Disorder-Cocaine Cocaine belongs to a group of powerful drugs known as stimulants. Common street names for cocaine include coke, crack, blow, snow, C, powder, and nose candy. Cocaine has some medical uses, but it is often misused because of the effects that it produces. These effects include:  A feeling of extreme pleasure (euphoria).  Alertness.  A high energy level. Stimulant use disorder is when your stimulant use disrupts your daily life. It may disrupt your relationships and how you do your job. Stimulant use disorder can be dangerous. Cocaine increases your blood pressure and heart rate. Using it can lead to a heart attack or stroke. Cocaine can also make your heart rate irregular and cause seizures. These problems can lead to death. What are the causes? This condition is caused by misusing cocaine. Many people start using cocaine because it makes them feel good. Over time, they get addicted to it. When they try to stop using it, they feel sick. What increases the risk? This condition is more likely to develop in:  People who misuse other drugs.  People with a family history of misusing drugs. What are the signs or symptoms? Symptoms of this condition include:  Using greater amounts of cocaine than you want to, or using cocaine for longer than you want to.  Trying several times to use less cocaine or to control your  cocaine use.  Craving cocaine.  Spending a lot of time getting cocaine, using it, or recovering from its effects.  Having problems at work, at school, at home, or with relationships because of cocaine use.  Giving up or cutting down on important life activities because of cocaine use.  Using cocaine when it is dangerous, such as when driving a car.  Continuing to use cocaine even though it is causing or has led to a physical problem, such as: ? Malnutrition. ? Nosebleeds. ? Chest pain. ? High blood pressure. ? A hole between the part of your nose that separates your nostrils (perforated nasal septum). ? Lung and kidney damage.  Continuing to use cocaine even though it is causing a mental problem, such as: ? Schizophrenia-like symptoms. ? Depression. ? Bipolar mood swings. ? Anxiety. ? Sleep problems.  Needing more and more cocaine to get the same  effect that you want (building up a tolerance).  Having symptoms of withdrawal when you stop using cocaine. Symptoms of withdrawal include: ? Depression. ? Irritability. ? Low energy. ? Restlessness. ? Bad dreams. ? Too little or too much sleep. ? Increased appetite. How is this diagnosed? This condition is diagnosed with an assessment. During the assessment, your health care provider will ask about your cocaine use and about how it affects your life. Your health care provider may also:  Perform a physical exam or do lab tests to see if you have physical problems resulting from cocaine use.  Screen for drug use.  Refer you to a mental health professional for evaluation. How is this treated? Treatment for this condition is usually provided by mental health professionals with training in substance use disorders. Treatment may involve:  Counseling. This treatment is also called talk therapy. It is provided by substance use treatment counselors. A counselor can address the reasons you use cocaine and suggest ways to keep you from  using it again. The goals of talk therapy are to: ? Find healthy activities to replace using cocaine. ? Identify and avoid what triggers your cocaine use. ? Help you learn how to handle cravings.  Support groups. Support groups are run by people who have quit using stimulants. They provide emotional support, advice, and guidance.  Medicines. Follow these instructions at home:  Take over-the-counter and prescription medicines only as told by your health care provider.  Check with your health care provider before starting any new medicines.  Do not use any products that contain nicotine or tobacco, such as cigarettes and e-cigarettes. If you need help quitting, ask your health care provider.  Keep all follow-up visits as told by your health care provider. This is important. Where to find more information  [employer] on Drug Abuse: motorcyclefax.com  Substance Abuse and Mental Health Services Administration: ktimeonline.com Contact a health care provider if:  You are not able to take your medicines as told.  You use cocaine again.  Your symptoms get worse. Get help right away if:  You have serious thoughts about hurting yourself or others.  You have a seizure.  You have chest pain.  You have sudden weakness.  You lose some of your vision.  You lose some of your speech. If you ever feel like you may hurt yourself or others, or have thoughts about taking your own life, get help right away. You can go to your nearest emergency department or call:  Your local emergency services (911 in the [place]  A suicide crisis helpline, such as the [place] at 623-745-4135. This is open 24 hours a day. This information is not intended to replace advice given to you by your health care provider. Make sure you discuss any questions you have with your health care provider. Document Revised: 06/21/2017 Document Reviewed: 04/20/2016 Elsevier Patient  Education  [place]:  If Emergency please seek Emergency Room Care Immediately or Call 911.   [facility] Address:  [address], [place] [address] Phone: [phone] Website : [email or web address]   [facility] Address:  [address] [place], [place] [address] Phone: [phone] Fax: [phone] Website: [email or web address] How To Access Our Services Because our main goal is to meet the needs of our consumers, [facility] operates on a walk-in basis! To access services, there are just 3 easy steps: 1) Walk in any Monday, Wednesday or Friday  between 8:00 am and 3:00 pm and complete our consumer paperwork 2) A Comprehensive Clinical Assessment (CCA) will be completed and appropriate service recommendations will be provided 3) Recommendations are sent to [facility] team members and the appropriate staff will call you within days. Advanced Access Open M - F, 8:00 am - 8:00 pm  Mental health crisis services for all age groups  Triage  Psychiatric Evaluations  Involuntary Commitments  [facility]  Address: [address], [place], [place] [address] Website : [email or web address] Walk in's accepted see web site or call for more information Phone : [phone] Also has [facility] [phone]    Psychology Today Find a therapist by searching online in your area or specialist by your diagnosis Website:  https://www.psychologytoday.com/us      Suicidal Feelings: How to Help Yourself Suicide is when you end your own life. There are many things you can do to help yourself feel better when struggling with these feelings. Many services and people are available to support you and others who struggle with similar feelings.  If you ever feel like you may hurt yourself or others, or have thoughts about  taking your own life, get help right away. To get help:  Call your local emergency services (911 in the [place]  [facility] [place] [facility]'s health and human services helpline (211 in the [place]  Go to your nearest emergency department.  Call a suicide hotline to speak with a trained counselor. The following suicide hotlines are available in the [place]: ? 1-800-273-TALK 361-395-5779). ? 1-800-SUICIDE 484-747-1507). ? 701-735-7228. This is a hotline for Spanish speakers. ? 445-777-3141. This is a hotline for TTY users. ? 1-866-4-U-TREVOR 778-467-6982). This is a hotline for lesbian, gay, bisexual, transgender, or questioning youth. ? For a list of hotlines in [place], visit ParkingAffiliatePrograms.se.html  Contact a crisis center or a local suicide prevention center. To find a crisis center or suicide prevention center: ? Call your local hospital, clinic, community service organization, mental health center, social service provider, or health department. Ask for help with connecting to a crisis center. ? For a list of crisis centers in the [place], visit: suicidepreventionlifeline.org ? For a list of crisis centers in [place], visit: suicideprevention.ca How to help yourself feel better   Promise yourself that you will not do anything extreme when you have suicidal feelings. Remember, there is hope. Many people have gotten through suicidal thoughts and feelings, and you can too. If you have had these feelings before, remind yourself that you can get through them again.  Let family, friends, teachers, or counselors know how you are feeling. Try not to separate yourself from those who care about you and want to help you. Talk with someone every day, even if you do not feel sociable. Face-to-face conversation is best to help them understand your feelings.  Contact a mental health care provider and work with this person regularly.  Make a safety  plan that you can follow during a crisis. Include phone numbers of suicide prevention hotlines, mental health professionals, and trusted friends and family members you can call during an emergency. Save these numbers on your phone.  If you are thinking of taking a lot of medicine, give your medicine to someone who can give it to you as prescribed. If you are on antidepressants and are concerned you will overdose, tell your health care provider so that he or she can give you safer medicines.  Try to stick to your routines. Follow  a schedule every day. Make self-care a priority.  Make a list of realistic goals, and cross them off when you achieve them. Accomplishments can give you a sense of worth.  Wait until you are feeling better before doing things that you find difficult or unpleasant.  Do things that you have always enjoyed to take your mind off your feelings. Try reading a book, or listening to or playing music. Spending time outside, in nature, may help you feel better. Follow these instructions at home:   Visit your primary health care provider every year for a checkup.  Work with a mental health care provider as needed.  Eat a well-balanced diet, and eat regular meals.  Get plenty of rest.  Exercise if you are able. Just 30 minutes of exercise each day can help you feel better.  Take over-the-counter and prescription medicines only as told by your health care provider. Ask your mental health care provider about the possible side effects of any medicines you are taking.  Do not use alcohol or drugs, and remove these substances from your home.  Remove weapons, poisons, knives, and other deadly items from your home. General recommendations  Keep your living space well lit.  When you are feeling well, write yourself a letter with tips and support that you can read when you are not feeling well.  Remember that life's difficulties can be sorted out with help. Conditions can be  treated, and you can learn behaviors and ways of thinking that will help you. Where to find more information  National Suicide Prevention Lifeline: www.suicidepreventionlifeline.org  Hopeline: www.hopeline.[place] for Suicide Prevention: PromotionalLoans.co.za  [employer] (for lesbian, gay, bisexual, transgender, or questioning youth): www.thetrevorproject.org Contact a health care provider if:  You feel as though you are a burden to others.  You feel agitated, angry, vengeful, or have extreme mood swings.  You have withdrawn from family and friends. Get help right away if:  You are talking about suicide or wishing to die.  You start making plans for how to commit suicide.  You feel that you have no reason to live.  You start making plans for putting your affairs in order, saying goodbye, or giving your possessions away.  You feel guilt, shame, or unbearable pain, and it seems like there is no way out.  You are frequently using drugs or alcohol.  You are engaging in risky behaviors that could lead to death. If you have any of these symptoms, get help right away. Call emergency services, go to your nearest emergency department or crisis center, or call a suicide crisis helpline. Summary  Suicide is when you take your own life.  Promise yourself that you will not do anything extreme when you have suicidal feelings.  Let family, friends, teachers, or counselors know how you are feeling.  Get help right away if you feel as though life is getting too tough to handle and you are thinking about suicide. This information is not intended to replace advice given to you by your health care provider. Make sure you discuss any questions you have with your health care provider. Document Revised: 10/30/2018 Document Reviewed: 02/19/2017 Elsevier Patient Education  [place].  Abdominal Bloating When you have abdominal bloating, your abdomen may feel full, tight, or painful. It may also look bigger than normal or swollen (distended). Common causes of abdominal bloating include:  Swallowing air.  Constipation.  Problems digesting food.  Eating too much.  Irritable bowel syndrome. This is a condition that affects the large intestine.  Lactose intolerance. This is an inability to digest lactose, a natural sugar in dairy products.  Celiac disease. This is a condition that affects the ability to digest gluten, a protein found in some grains.  Gastroparesis. This is a condition that slows down the movement of food in the stomach and small intestine. It is more common in people with diabetes mellitus.  Gastroesophageal reflux disease (GERD). This is a digestive condition that makes stomach acid flow back into the esophagus.  Urinary retention. This means that the body is holding onto urine, and the bladder cannot be emptied all the way. Follow these instructions at home: Eating and drinking  Avoid eating too much.  Try not to swallow air while talking or eating.  Avoid eating while lying down.  Avoid these foods and drinks: ? Foods that cause gas, such as broccoli, cabbage, cauliflower, and baked beans. ? Carbonated drinks. ? Hard candy. ? Chewing gum. Medicines  Take over-the-counter and prescription medicines only as told by your health care provider.  Take probiotic medicines. These medicines contain live bacteria or yeasts that can help digestion.  Take coated peppermint oil capsules. Activity  Try to exercise regularly. Exercise may help to relieve bloating that is  caused by gas and relieve constipation. General instructions  Keep all follow-up visits as told by your health care provider. This is important. Contact a health care provider if:  You have nausea and vomiting.  You have diarrhea.  You have abdominal pain.  You have unusual weight loss or weight gain.  You have severe pain, and medicines do not help. Get help right away if:  You have severe chest pain.  You have trouble breathing.  You have shortness of breath.  You have trouble urinating.  You have darker urine than normal.  You have blood in your stools or have dark, tarry stools. Summary  Abdominal bloating means that the abdomen is swollen.  Common causes of abdominal bloating are swallowing air, constipation, and problems digesting food.  Avoid eating too much and avoid swallowing air.  Avoid foods that cause gas, carbonated drinks, hard candy, and chewing gum. This information is not intended to replace advice given to you by your health care provider. Make sure you discuss any questions you have with your health care provider. Document Revised: 10/27/2018 Document Reviewed: 08/10/2016 Elsevier Patient Education  [facility].

## 2020-02-26 ENCOUNTER — Encounter: Payer: Self-pay | Admitting: Adult Health

## 2020-02-26 DIAGNOSIS — F1210 Cannabis abuse, uncomplicated: Secondary | ICD-10-CM | POA: Insufficient documentation

## 2020-02-26 DIAGNOSIS — K580 Irritable bowel syndrome with diarrhea: Secondary | ICD-10-CM | POA: Insufficient documentation

## 2020-02-26 DIAGNOSIS — R140 Abdominal distension (gaseous): Secondary | ICD-10-CM | POA: Insufficient documentation

## 2020-02-26 DIAGNOSIS — K219 Gastro-esophageal reflux disease without esophagitis: Secondary | ICD-10-CM | POA: Insufficient documentation

## 2020-03-02 LAB — COMPREHENSIVE METABOLIC PANEL
ALT: 11 IU/L (ref 0–32)
AST: 17 IU/L (ref 0–40)
Albumin/Globulin Ratio: 1.8 (ref 1.2–2.2)
Albumin: 4.3 g/dL (ref 3.8–4.8)
Alkaline Phosphatase: 62 IU/L (ref 48–121)
BUN/Creatinine Ratio: 18 (ref 9–23)
BUN: 14 mg/dL (ref 6–20)
Bilirubin Total: 0.4 mg/dL (ref 0.0–1.2)
CO2: 20 mmol/L (ref 20–29)
Calcium: 9 mg/dL (ref 8.7–10.2)
Chloride: 105 mmol/L (ref 96–106)
Creatinine, Ser: 0.77 mg/dL (ref 0.57–1.00)
GFR calc Af Amer: 117 mL/min/{1.73_m2} (ref >59)
GFR calc non Af Amer: 102 mL/min/{1.73_m2} (ref >59)
Globulin, Total: 2.4 g/dL (ref 1.5–4.5)
Glucose: 89 mg/dL (ref 65–99)
Potassium: 4.1 mmol/L (ref 3.5–5.2)
Sodium: 140 mmol/L (ref 134–144)
Total Protein: 6.7 g/dL (ref 6.0–8.5)

## 2020-03-02 LAB — CBC WITH DIFFERENTIAL/PLATELET
Basophils Absolute: 0 10*3/uL (ref 0.0–0.2)
Basos: 1 %
EOS (ABSOLUTE): 0.1 10*3/uL (ref 0.0–0.4)
Eos: 2 %
Hematocrit: 34.1 % (ref 34.0–46.6)
Hemoglobin: 11.6 g/dL (ref 11.1–15.9)
Immature Grans (Abs): 0 10*3/uL (ref 0.0–0.1)
Immature Granulocytes: 0 %
Lymphocytes Absolute: 2 10*3/uL (ref 0.7–3.1)
Lymphs: 41 %
MCH: 27.2 pg (ref 26.6–33.0)
MCHC: 34 g/dL (ref 31.5–35.7)
MCV: 80 fL (ref 79–97)
Monocytes Absolute: 0.5 10*3/uL (ref 0.1–0.9)
Monocytes: 11 %
Neutrophils Absolute: 2.2 10*3/uL (ref 1.4–7.0)
Neutrophils: 45 %
Platelets: 317 10*3/uL (ref 150–450)
RBC: 4.26 x10E6/uL (ref 3.77–5.28)
RDW: 13 % (ref 11.7–15.4)
WBC: 4.8 10*3/uL (ref 3.4–10.8)

## 2020-03-02 LAB — TSH: TSH: 1.01 u[IU]/mL (ref 0.450–4.500)

## 2020-03-02 LAB — BETA HCG QUANT (REF LAB): hCG Quant: <1 m[IU]/mL

## 2020-03-03 NOTE — Progress Notes (Signed)
TSH within normal limits.  Beta HCG negative or pregnancy as was in office urine test for pregnancy. CBC within normal limits no signs of infection or anemia. CMP within normal limits electrolytes, kidney function and liver function.   Please follow up on her Gastrointestinal and Psychiatric referrals that were placed at last viist. Psych needs to be sent elsewhere see other message of denial. Thanks .

## 2020-03-11 ENCOUNTER — Encounter: Payer: Medicaid Other | Admitting: Certified Nurse Midwife

## 2020-03-29 ENCOUNTER — Ambulatory Visit: Payer: Self-pay | Admitting: Adult Health

## 2020-04-20 ENCOUNTER — Other Ambulatory Visit: Payer: Self-pay

## 2020-04-20 ENCOUNTER — Emergency Department (HOSPITAL_BASED_OUTPATIENT_CLINIC_OR_DEPARTMENT_OTHER)
Admission: EM | Admit: 2020-04-20 | Discharge: 2020-04-20 | Disposition: A | Discharge status: Home / Self Care | Payer: Medicaid Other | Attending: Emergency Medicine | Admitting: Emergency Medicine

## 2020-04-20 ENCOUNTER — Emergency Department (HOSPITAL_BASED_OUTPATIENT_CLINIC_OR_DEPARTMENT_OTHER): Payer: Medicaid Other

## 2020-04-20 ENCOUNTER — Encounter (HOSPITAL_BASED_OUTPATIENT_CLINIC_OR_DEPARTMENT_OTHER): Payer: Self-pay | Admitting: Emergency Medicine

## 2020-04-20 DIAGNOSIS — N3091 Cystitis, unspecified with hematuria: Secondary | ICD-10-CM | POA: Insufficient documentation

## 2020-04-20 DIAGNOSIS — I10 Essential (primary) hypertension: Secondary | ICD-10-CM | POA: Diagnosis not present

## 2020-04-20 DIAGNOSIS — N3001 Acute cystitis with hematuria: Secondary | ICD-10-CM

## 2020-04-20 DIAGNOSIS — F17210 Nicotine dependence, cigarettes, uncomplicated: Secondary | ICD-10-CM | POA: Insufficient documentation

## 2020-04-20 DIAGNOSIS — M545 Low back pain: Secondary | ICD-10-CM | POA: Diagnosis not present

## 2020-04-20 DIAGNOSIS — R109 Unspecified abdominal pain: Secondary | ICD-10-CM | POA: Diagnosis present

## 2020-04-20 DIAGNOSIS — R110 Nausea: Secondary | ICD-10-CM | POA: Insufficient documentation

## 2020-04-20 LAB — CBC WITH DIFFERENTIAL/PLATELET
Abs Immature Granulocytes: 0.01 10*3/uL (ref 0.00–0.07)
Basophils Absolute: 0 10*3/uL (ref 0.0–0.1)
Basophils Relative: 1 %
Eosinophils Absolute: 0.1 10*3/uL (ref 0.0–0.5)
Eosinophils Relative: 1 %
HCT: 40.4 % (ref 36.0–46.0)
Hemoglobin: 12.6 g/dL (ref 12.0–15.0)
Immature Granulocytes: 0 %
Lymphocytes Relative: 32 %
Lymphs Abs: 2 10*3/uL (ref 0.7–4.0)
MCH: 26.5 pg (ref 26.0–34.0)
MCHC: 31.2 g/dL (ref 30.0–36.0)
MCV: 84.9 fL (ref 80.0–100.0)
Monocytes Absolute: 0.5 10*3/uL (ref 0.1–1.0)
Monocytes Relative: 9 %
Neutro Abs: 3.5 10*3/uL (ref 1.7–7.7)
Neutrophils Relative %: 57 %
Platelets: 279 10*3/uL (ref 150–400)
RBC: 4.76 MIL/uL (ref 3.87–5.11)
RDW: 14 % (ref 11.5–15.5)
WBC: 6.1 10*3/uL (ref 4.0–10.5)
nRBC: 0 % (ref 0.0–0.2)

## 2020-04-20 LAB — COMPREHENSIVE METABOLIC PANEL
ALT: 14 U/L (ref 0–44)
AST: 15 U/L (ref 15–41)
Albumin: 4 g/dL (ref 3.5–5.0)
Alkaline Phosphatase: 42 U/L (ref 38–126)
Anion gap: 11 (ref 5–15)
BUN: 10 mg/dL (ref 6–20)
CO2: 19 mmol/L — ABNORMAL LOW (ref 22–32)
Calcium: 9.2 mg/dL (ref 8.9–10.3)
Chloride: 105 mmol/L (ref 98–111)
Creatinine, Ser: 0.78 mg/dL (ref 0.44–1.00)
GFR calc Af Amer: >60 mL/min (ref >60)
GFR calc non Af Amer: >60 mL/min (ref >60)
Glucose, Bld: 88 mg/dL (ref 70–99)
Potassium: 3.7 mmol/L (ref 3.5–5.1)
Sodium: 135 mmol/L (ref 135–145)
Total Bilirubin: 0.6 mg/dL (ref 0.3–1.2)
Total Protein: 7.5 g/dL (ref 6.5–8.1)

## 2020-04-20 LAB — URINALYSIS, ROUTINE W REFLEX MICROSCOPIC
Bilirubin Urine: NEGATIVE
Glucose, UA: NEGATIVE mg/dL
Ketones, ur: NEGATIVE mg/dL
Nitrite: NEGATIVE
Protein, ur: NEGATIVE mg/dL
Specific Gravity, Urine: 1.025 (ref 1.005–1.030)
pH: 6 (ref 5.0–8.0)

## 2020-04-20 LAB — URINALYSIS, MICROSCOPIC (REFLEX)

## 2020-04-20 LAB — PREGNANCY, URINE: Preg Test, Ur: NEGATIVE

## 2020-04-20 MED ORDER — METHOCARBAMOL 500 MG PO TABS: 500.0000 mg | ORAL_TABLET | Freq: Two times a day (BID) | ORAL | 0 refills | Status: DC

## 2020-04-20 MED ORDER — CEPHALEXIN 500 MG PO CAPS: 500.0000 mg | ORAL_CAPSULE | Freq: Four times a day (QID) | ORAL | 0 refills | Status: AC

## 2020-04-20 MED ORDER — ONDANSETRON HCL 4 MG/2ML IJ SOLN
4.0000 mg | Freq: Once | INTRAMUSCULAR | Status: AC
  Administered 2020-04-20: 4 mg via INTRAVENOUS
  Filled 2020-04-20: qty 2

## 2020-04-20 MED ORDER — KETOROLAC TROMETHAMINE 15 MG/ML IJ SOLN
15.0000 mg | Freq: Once | INTRAMUSCULAR | Status: AC
  Administered 2020-04-20: 15 mg via INTRAVENOUS
  Filled 2020-04-20: qty 1

## 2020-04-20 NOTE — ED Notes (Signed)
I took the pelvic cart to the bedside and pt said "I changed my mind".  Has decided she does not want a pelvic exam.  PA notified.

## 2020-04-20 NOTE — ED Provider Notes (Signed)
[facility]GENCY DEPARTMENT Provider Note   CSN: [id number] Arrival date & time: 04/20/20  6283     History Chief Complaint  Patient presents with  . Flank Pain    left    Kristen Chung is a 34 y.o. female who presents for evaluation of left flank pain.  She reports she has been intermittently having this pain for about a month or so but reports that over the last 3 days, the pain has become more severe, more frequent.  She describes it as a dull achy pain but then will become more severe and sharp.  She has had some nausea and one episode of vomiting.  She thought she may have had a urinary tract infection so she tried over-the-counter Azo but states that did not help so she stopped taking it.  She has not had any dysuria.  She states her urine turned orange after using Azo but no blood noted in the urine.  She states that she had kidney stones as a child but has never had them as an adult.  She denies any fevers, chest pain, difficulty breathing, vaginal bleeding, vaginal discharge.  The history is provided by the patient.       Past Medical History:  Diagnosis Date  . Allergy   . Anemia   . Anxiety   . BPPV (benign paroxysmal positional vertigo)   . Chlamydia   . Crohn disease ([place])   . Depression    No specific treatment  . Eczema   . Esophagitis   . Gastritis   . Genital warts   . Gonorrhea   . Hearing loss   . Heart murmur   . Hiatal hernia   . Kidney stone   . PID (acute pelvic inflammatory disease)   . Pregnancy induced hypertension   . Preterm labor   . Substance abuse ([place])   . Urinary tract infection   . Urticaria     Patient Active Problem List   Diagnosis Date Noted  . Irritable bowel syndrome with diarrhea 02/26/2020  . Abdominal bloating 02/26/2020  . Gastroesophageal reflux disease 02/26/2020  . Marijuana abuse 02/26/2020  . Weight gain 02/25/2020  . Fatigue 02/25/2020  . IUD (intrauterine device) in place 02/14/2020  . Seasonal  and perennial allergic rhinoconjunctivitis 02/10/2020  . Essential hypertension 12/07/2019  . Bilateral swelling of feet 11/17/2019  . Cough 11/17/2019  . Wheezing 11/09/2019  . Pruritus 11/09/2019  . Other allergic rhinitis 11/09/2019  . Neck pain 10/30/2019  . Vertigo 10/30/2019  . Allergic reaction 10/12/2019  . History of Crohn's disease 10/12/2019  . Vaginal bleeding 10/12/2019  . History of abortion- 05/18/2020  10/12/2019  . History of cocaine use 10/12/2019  . Marijuana smoker 10/12/2019  . BPPV (benign paroxysmal positional vertigo), unspecified laterality -reported history  10/12/2019  . Chronic tension-type headache, intractable 10/12/2019  . History of genital warts 10/12/2019  . Adverse food reaction 10/12/2019  . Food allergic contact dermatitis 10/12/2019  . Swelling of both lips 10/12/2019  . Alcohol use 10/12/2019  . History of suicide attempt 10/12/2019  . Suicidal thoughts 10/12/2019  . Shortness of breath 08/28/2018  . Sensorineural hearing loss (SNHL) of both ears 05/05/2018  . Tinnitus of right ear 05/05/2018  . Breast pain in female 05/13/2013  . Crohn disease ([place]) 05/13/2013  . Cocaine substance abuse ([place]) 05/13/2013  . Tobacco abuse counseling 05/13/2013  . Difficulty swallowing 05/13/2013  . 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 abortion     x2     OB History    Gravida  9   Para  2   Term  1   Preterm  1   AB  4   Living  2     SAB  1   TAB  3   Ectopic  0   Multiple  0   Live Births  2           Family History  Problem Relation Age of Onset  . Asthma Mother   . Heart disease Mother   . Diabetes Mother   . Hypertension Mother   . Cancer Father        Breast  . Asthma Sister   . Depression Sister   . Diabetes Sister   . Hypertension Sister   . Breast cancer Sister   . Diabetes Other   . Anesthesia problems Neg Hx     Social History    Tobacco Use  . Smoking status: Current Some Day Smoker    Packs/day: 0.25    Years: 6.00    Pack years: 1.50    Types: Cigarettes  . Smokeless tobacco: Never Used  Vaping Use  . Vaping Use: Never used  Substance Use Topics  . Alcohol use: Not Currently    Alcohol/week: 9.0 - 16.0 standard drinks    Types: 4 - 6 Cans of beer, 5 - 10 Shots of liquor per week  . Drug use: Not Currently    Types: Marijuana, Cocaine    Home Medications Prior to Admission medications   Medication Sig Start Date End Date Taking? Authorizing Provider  ARIPiprazole (ABILIFY) 5 MG tablet Take 5 mg by mouth every morning. 02/11/20   [provider]  cephALEXin (KEFLEX) 500 MG capsule Take 1 capsule (500 mg total) by mouth 4 (four) times daily for 7 days. 04/20/20 04/27/20  Volanda Napoleon, PA-C  cetirizine (ZYRTEC ALLERGY) 10 MG tablet Take 1 tablet (10 mg total) by mouth daily. 11/09/19   Garnet Sierras, DO  diphenhydrAMINE (BENADRYL) 25 mg capsule Take 1 capsule (25 mg total) by mouth every 6 (six) hours as needed. 11/09/15   Gloriann Loan, PA-C  EPINEPHrine 0.3 mg/0.3 mL IJ SOAJ injection Inject 0.3 mLs (0.3 mg total) into the muscle as needed for anaphylaxis. 10/09/19   Flinchum, Kelby Aline, FNP  esomeprazole (NEXIUM) 20 MG capsule Take 1 capsule (20 mg total) by mouth daily at 12 noon. 02/25/20   Flinchum, Kelby Aline, FNP  fluticasone (FLONASE) 50 MCG/ACT nasal spray Place 1-2 sprays into both nostrils daily. 11/09/19   Garnet Sierras, DO  methocarbamol (ROBAXIN) 500 MG tablet Take 1 tablet (500 mg total) by mouth 2 (two) times daily. 04/20/20   Volanda Napoleon, PA-C  NIFEdipine (ADALAT CC) 30 MG 24 hr tablet Take 1 tablet (30 mg total) by mouth in the morning and at bedtime. 02/14/20   Diona Fanti, CNM    Allergies    Celery oil, Codone [hydrocodone], Dilaudid [hydromorphone hcl], Hydromorphone hcl, Other, Strawberry extract, and Tramadol  Review of Systems   Review of Systems   Constitutional: Negative for fever.  Respiratory: Negative for cough and shortness of breath.   Cardiovascular: Negative for chest pain.  Gastrointestinal: Positive for abdominal pain, nausea and vomiting.  Genitourinary: Positive for flank pain. Negative for dysuria and hematuria.  Neurological: Negative for headaches.  All other systems reviewed and  are negative.   Physical Exam Updated Vital Signs BP 118/83 (BP Location: Left Arm)   Pulse 72   Temp 97.7 F (36.5 C) (Oral)   Resp 16   SpO2 100%   Physical Exam Vitals and nursing note reviewed.  Constitutional:      Appearance: Normal appearance. She is well-developed.     Comments: Sitting comfortably on examination table  HENT:     Head: Normocephalic and atraumatic.  Eyes:     General: Lids are normal.     Conjunctiva/sclera: Conjunctivae normal.     Pupils: Pupils are equal, round, and reactive to light.  Cardiovascular:     Rate and Rhythm: Normal rate and regular rhythm.     Pulses: Normal pulses.     Heart sounds: Normal heart sounds. No murmur heard.  No friction rub. No gallop.   Pulmonary:     Effort: Pulmonary effort is normal.     Breath sounds: Normal breath sounds.     Comments: Lungs clear to auscultation bilaterally.  Symmetric chest rise.  No wheezing, rales, rhonchi. Abdominal:     Palpations: Abdomen is soft. Abdomen is not rigid.     Tenderness: There is abdominal tenderness in the left lower quadrant. There is left CVA tenderness. There is no guarding.     Comments: Abdomen is soft, nondistended.  Tenderness palpation of the left lower quadrant.  Left-sided CVA tenderness noted.  No rigidity, guarding.  Musculoskeletal:        General: Normal range of motion.     Cervical back: Full passive range of motion without pain.       Back:     Comments: Diffuse tenderness palpation noted to the paraspinal muscles of the right lower lumbar region.  Worse with movement and worse with moving leg.  Skin:     General: Skin is warm and dry.     Capillary Refill: Capillary refill takes less than 2 seconds.  Neurological:     Mental Status: She is alert and oriented to person, place, and time.  Psychiatric:        Speech: Speech normal.     ED Results / Procedures / Treatments   Labs (all labs ordered are listed, but only abnormal results are displayed) Labs Reviewed  URINALYSIS, ROUTINE W REFLEX MICROSCOPIC - Abnormal; Notable for the following components:      Result Value   APPearance HAZY (*)    Hgb urine dipstick TRACE (*)    Leukocytes,Ua SMALL (*)    All other components within normal limits  URINALYSIS, MICROSCOPIC (REFLEX) - Abnormal; Notable for the following components:   Bacteria, UA MANY (*)    All other components within normal limits  COMPREHENSIVE METABOLIC PANEL - Abnormal; Notable for the following components:   CO2 19 (*)    All other components within normal limits  URINE CULTURE  WET PREP, GENITAL  PREGNANCY, URINE  CBC WITH DIFFERENTIAL/PLATELET  GC/CHLAMYDIA PROBE AMP ([facility]) NOT AT [facility]    EKG None  Radiology CT Renal Stone Study  Result Date: 04/20/2020 CLINICAL DATA:  Acute left flank pain. EXAM: CT ABDOMEN AND PELVIS WITHOUT CONTRAST TECHNIQUE: Multidetector CT imaging of the abdomen and pelvis was performed following the standard protocol without IV contrast. COMPARISON:  [facility] 23, 2017. FINDINGS: Lower chest: No acute abnormality. Hepatobiliary: No focal liver abnormality is seen. No gallstones, gallbladder wall thickening, or biliary dilatation. Pancreas: Unremarkable. No pancreatic ductal dilatation or surrounding inflammatory changes. Spleen: Normal in size  without focal abnormality. Adrenals/Urinary Tract: Adrenal glands are unremarkable. Kidneys are normal, without renal calculi, focal lesion, or hydronephrosis. Bladder is unremarkable. Stomach/Bowel: Stomach is within normal limits. Appendix appears normal. No evidence of bowel wall thickening,  distention, or inflammatory changes. Vascular/Lymphatic: No significant vascular findings are present. No enlarged abdominal or pelvic lymph nodes. Reproductive: Intrauterine device is noted. No adnexal abnormality is noted. Other: No abdominal wall hernia or abnormality. No abdominopelvic ascites. Musculoskeletal: No acute or significant osseous findings. IMPRESSION: No abnormality seen in the abdomen or pelvis. Electronically Signed   By: Marijo Conception M.D.   On: 04/20/2020 12:44    Procedures Procedures (including critical care time)  Medications Ordered in ED Medications  ondansetron (ZOFRAN) injection 4 mg (4 mg Intravenous Given 04/20/20 1219)  ketorolac (TORADOL) 15 MG/ML injection 15 mg (15 mg Intravenous Given 04/20/20 1220)  ketorolac (TORADOL) 15 MG/ML injection 15 mg (15 mg Intravenous Given 04/20/20 1352)    ED Course  I have reviewed the triage vital signs and the nursing notes.  Pertinent labs & imaging results that were available during my care of the patient were reviewed by me and considered in my medical decision making (see chart for details).    MDM Rules/Calculators/A&P                          34 y.o. F who presents for evaluation of left flank pain x3 days.  Associated with nausea.  No fevers.  No dysuria, hematuria.  History of kidney stones as a child but none as an adult.  On initial ED arrival, she is afebrile, nontoxic-appearing.  Vital signs are stable.  On exam, she is palpation of the lower abdomen as well as left-sided CVA tenderness.  Concern for GI etiology such as UTI versus pyelonephritis versus kidney stone.  Plan to check labs, imaging.  CBC shows no leukocytosis or anemia.  CMP shows normal BUN and creatinine.  UA shows hemoglobin, leukocytes, bacteria but there is squamous epithelium.  Urine pregnancy is negative. Urine culture sent.   CT scan shows no evidence of kidney stone or evidence of pyelo.   Re-evaluation. Patient reports she is still  having pain. She does report improvement in pain after Toradol in the ED. Will give her additional dose.   Re-evaluation. Repeat abdominal exam is improved.  She states she still having some mild tenderness but is improved from when she first came in.  I discussed with her that if she is still having tenderness, we can do a pelvic exam.  Patient was agreeable.  RN informed me that patient had changed her mind and did not want a pelvic exam.  Patient is hemodynamically stable and well-appearing in the ED, I feel that this is reasonable.  We will plan to treat this as musculoskeletal.  She does have pain when she moves and when she lifts her leg, it makes the pain in the lower back worse.  Question if there is like a musculoskeletal component to this.  We will additionally plan to treat her urine given that she has had some symptoms.  Patient will be discharged home with Keflex and Robaxin. At this time, patient exhibits no emergent life-threatening condition that require further evaluation in ED. Patient had ample opportunity for questions and discussion. All patient's questions were answered with full understanding. Strict return precautions discussed. Patient expresses understanding and agreement to plan.    Portions of this note were generated  with [employer]. Dictation errors may occur despite best attempts at proofreading.   Final Clinical Impression(s) / ED Diagnoses Final diagnoses:  Flank pain  Acute cystitis with hematuria    Rx / DC Orders ED Discharge Orders         Ordered    cephALEXin (KEFLEX) 500 MG capsule  4 times daily        04/20/20 1454    methocarbamol (ROBAXIN) 500 MG tablet  2 times daily        04/20/20 1454           Desma Mcgregor 04/20/20 1511    Lucrezia Starch, MD 04/21/20 [phone]

## 2020-04-20 NOTE — ED Triage Notes (Signed)
Left flank pain x 3 days, no urinary symptoms . Hx kidney stones

## 2020-04-20 NOTE — Discharge Instructions (Signed)
Take antibiotics as directed. Please take all of your antibiotics until finished.  Take Robaxin as prescribed. This medication will make you drowsy so do not drive or drink alcohol when taking it.  You can take Tylenol or Ibuprofen as directed for pain. You can alternate Tylenol and Ibuprofen every 4 hours. If you take Tylenol at 1pm, then you can take Ibuprofen at 5pm. Then you can take Tylenol again at 9pm.   Follow-up with your primary care doctor in 2-3 days. Call their office and let them know you were seen in the ED.   Return to the emergency department for any worsening pain, fever, vomiting or any other worsening or concerning symptoms.

## 2020-04-20 NOTE — ED Notes (Addendum)
ED Provider at bedside. Graham crackers and orange juice given per VO edp.

## 2020-04-21 LAB — URINE CULTURE

## 2020-04-26 ENCOUNTER — Ambulatory Visit (INDEPENDENT_AMBULATORY_CARE_PROVIDER_SITE_OTHER): Payer: Medicaid Other | Admitting: Adult Health

## 2020-04-26 DIAGNOSIS — Z91199 Patient's noncompliance with other medical treatment and regimen due to unspecified reason: Secondary | ICD-10-CM | POA: Insufficient documentation

## 2020-04-26 DIAGNOSIS — Z5329 Procedure and treatment not carried out because of patient's decision for other reasons: Secondary | ICD-10-CM

## 2020-04-26 NOTE — Progress Notes (Signed)
   Complete physical exam  Patient: Kristen Chung   DOB: 05/12/1998   34 y.o. Female  MRN: [id number]  Subjective:    No chief complaint on file.   Kristen Chung is a 34 y.o. female who presents today for a complete physical exam. She reports consuming a {diet types:17450} diet. {types:19826} She generally feels {DESC; WELL/FAIRLY WELL/POORLY:18703}. She reports sleeping {DESC; WELL/FAIRLY WELL/POORLY:18703}. She {does/does not:200015} have additional problems to discuss today.    Most recent fall risk assessment:    01/17/2022   10:42 AM  Fall Risk   Falls in the past year? 0  Number falls in past yr: 0  Injury with Fall? 0  Risk for fall due to : No Fall Risks  Follow up Falls evaluation completed     Most recent depression screenings:    01/17/2022   10:42 AM 12/08/2020   10:46 AM  PHQ 2/9 Scores  PHQ - 2 Score 0 0  PHQ- 9 Score 5     {VISON DENTAL STD PSA (Optional):27386}  {History (Optional):23778}  Patient Care Team: Jessup, Joy, NP as PCP - General (Nurse Practitioner)   Outpatient Medications Prior to Visit  Medication Sig   fluticasone (FLONASE) 50 MCG/ACT nasal spray Place 2 sprays into both nostrils in the morning and at bedtime. After 7 days, reduce to once daily.   norgestimate-ethinyl estradiol (SPRINTEC 28) 0.25-35 MG-MCG tablet Take 1 tablet by mouth daily.   Nystatin POWD Apply liberally to affected area 2 times per day   spironolactone (ALDACTONE) 100 MG tablet Take 1 tablet (100 mg total) by mouth daily.   No facility-administered medications prior to visit.    ROS        Objective:     There were no vitals taken for this visit. {Vitals History (Optional):23777}  Physical Exam   No results found for any visits on 02/22/22. {Show previous labs (optional):23779}    Assessment & Plan:    Routine Health Maintenance and Physical Exam  Immunization History  Administered Date(s) Administered   DTaP 07/26/1999, 09/21/1999,  11/30/1999, 08/15/2000, 02/29/2004   Hepatitis A 12/26/2007, 12/31/2008   Hepatitis B 05/13/1999, 06/20/1999, 11/30/1999   HiB (PRP-OMP) 07/26/1999, 09/21/1999, 11/30/1999, 08/15/2000   IPV 07/26/1999, 09/21/1999, 05/20/2000, 02/29/2004   Influenza,inj,Quad PF,6+ Mos 04/02/2014   Influenza-Unspecified 07/02/2012   MMR 05/20/2001, 02/29/2004   Meningococcal Polysaccharide 12/31/2011   Pneumococcal Conjugate-13 08/15/2000   Pneumococcal-Unspecified 11/30/1999, 02/13/2000   Tdap 12/31/2011   Varicella 05/20/2000, 12/26/2007    Health Maintenance  Topic Date Due   HIV Screening  Never done   Hepatitis C Screening  Never done   INFLUENZA VACCINE  02/20/2022   PAP-Cervical Cytology Screening  02/22/2022 (Originally 05/11/2020)   PAP SMEAR-Modifier  02/22/2022 (Originally 05/11/2020)   TETANUS/TDAP  02/22/2022 (Originally 12/30/2021)   HPV VACCINES  Discontinued   COVID-19 Vaccine  Discontinued    Discussed health benefits of physical activity, and encouraged her to engage in regular exercise appropriate for her age and condition.  Problem List Items Addressed This Visit   None Visit Diagnoses     Annual physical exam    -  Primary   Cervical cancer screening       Need for Tdap vaccination          No follow-ups on file.     Joy Jessup, NP   

## 2020-04-29 NOTE — Progress Notes (Signed)
Pt present today for vaginal pain. Pt stated having the pain x 2-3 weeks. Pt c/o of pain with sex, lower abd and back pain, pelvic pain when walking and pain when she sits down in the vaginal area. Pt stated that she think it maybe the Mirena.

## 2020-05-02 ENCOUNTER — Ambulatory Visit (INDEPENDENT_AMBULATORY_CARE_PROVIDER_SITE_OTHER): Payer: Medicaid Other | Admitting: Certified Nurse Midwife

## 2020-05-02 ENCOUNTER — Other Ambulatory Visit: Payer: Self-pay

## 2020-05-02 ENCOUNTER — Encounter: Payer: Self-pay | Admitting: Certified Nurse Midwife

## 2020-05-02 VITALS — BP 150/97 | HR 70 | Ht 64.0 in | Wt 177.6 lb

## 2020-05-02 DIAGNOSIS — Z30432 Encounter for removal of intrauterine contraceptive device: Secondary | ICD-10-CM | POA: Diagnosis not present

## 2020-05-02 DIAGNOSIS — N941 Unspecified dyspareunia: Secondary | ICD-10-CM

## 2020-05-02 DIAGNOSIS — R102 Pelvic and perineal pain: Secondary | ICD-10-CM

## 2020-05-02 NOTE — Progress Notes (Signed)
Kristen Chung is a 34 y.o. year old [id number] African American female who presents for removal of a Mirena IUD due to pelvic pain and dyspareunia. Her Mirena IUD was placed 02/12/2020.   BP (!) 150/97   Pulse 70   Ht 5' 4"  (1.626 m)   Wt 177 lb 9.6 oz (80.6 kg)   BMI 30.48 kg/m   Time out was performed.  A small plastic speculum was placed in the vagina.  The cervix was visualized, and the strings were visible. They were grasped and the Mirena was easily removed intact without complications.   Reviewed red flag symptoms and when to call.   RTC for ANNUAL EXAM and PAP.    Dani Gobble, CNM [facility], [facility] 05/02/20 12:18 PM

## 2020-05-02 NOTE — Patient Instructions (Signed)
Lactic acid; Citric acid; Potassium bitartrate vaginal gel What is this medicine? LACTIC ACID; CITRIC ACID; POTASSIUM BITARTRATE (LAK tuhk AS id; SIH trik AS id; poe TASS ee um bi TAAR treit) is used for birth control when you have sexual intercourse to help prevent pregnancy. This medicine may be used for other purposes; ask your health care provider or pharmacist if you have questions. COMMON BRAND NAME(S): PHEXXI What should I tell my health care provider before I take this medicine? They need to know if you have any of these conditions:  frequent kidney or urinary tract infections  HIV or AIDS  pelvic or vaginal infection  sexually transmitted disease, like herpes, gonorrhea, or chlamydia  an unusual or allergic reaction to lactic acid, citric acid, potassium bitartrate, other medicines, foods, dyes, or preservatives  pregnant or trying to get pregnant  breast-feeding How should I use this medicine? This medicine is for use in the vagina only. Follow the directions on the prescription label. Wash hands before and after use. To prevent pregnancy, it is very important that this product is used properly. This product must be applied before sexual contact begins. Do not use it in the rectum. Make sure you carefully read and follow the instructions that come with the product. If you have vaginal sex more than 1 time within 1 hour, you must use a new applicator. Use exactly as directed. Talk to your pediatrician regarding the use of this medicine in children. Special care may be needed. Overdosage: If you think you have taken too much of this medicine contact a poison control center or emergency room at once. NOTE: This medicine is only for you. Do not share this medicine with others. What if I miss a dose? Use before each time you have sexual intercourse as directed on the label. If you miss a dose, you may become pregnant. What may interact with this medicine? Interactions are not  expected. This birth control may be used with other medicines used in the vagina to treat infections including miconazole, metronidazole, and tioconazole. If you have questions ask your doctor or pharmacist. Do not use any other vaginal products at the same time without talking to your health care professional. This list may not describe all possible interactions. Give your health care provider a list of all the medicines, herbs, non-prescription drugs, or dietary supplements you use. Also tell them if you smoke, drink alcohol, or use illegal drugs. Some items may interact with your medicine. What should I watch for while using this medicine? This product does not protect you against HIV infection (AIDS) or other sexually transmitted diseases. This product may be used at any time of your menstrual cycle. This product may be used as soon as your healthcare provider tells you it is safe for you to have sex after childbirth, abortion, or miscarriage. This product may be used with most hormonal birth control methods; a vaginal diaphragm; or latex, polyurethane and polyisoprene condoms. Avoid using this product with contraceptive vaginal rings. What side effects may I notice from receiving this medicine? Side effects that you should report to your doctor or health care professional as soon as possible:  allergic reactions like skin rash, itching or hives, swelling  signs and symptoms of infection like fever; chills; pelvic pain; cloudy urine; pain or trouble passing urine  vaginal discharge, itching or odor  vaginal irritation or burning Side effects that usually do not require medical attention (report these to your doctor or health care professional  if they continue or are bothersome):  mild vaginal irritation This list may not describe all possible side effects. Call your doctor for medical advice about side effects. You may report side effects to FDA at 1-800-FDA-1088. Where should I keep my  medicine? Keep out of the reach of children. Store at room temperature between 15 and 30 degrees C (59 and 86 degrees F). Keep in the foil pouch until ready for use. Follow the directions on the product label. Throw away any unused medicine after the expiration date. NOTE: This sheet is a summary. It may not cover all possible information. If you have questions about this medicine, talk to your doctor, pharmacist, or health care provider.  2020 Elsevier/Gold Standard (2018-12-18 09:34:36)   Pap Test Why am I having this test? A Pap test, also called a Pap smear, is a screening test to check for signs of:  Cancer of the vagina, cervix, and uterus. The cervix is the lower part of the uterus that opens into the vagina.  Infection.  Changes that may be a sign that cancer is developing (precancerous changes). Women need this test on a regular basis. In general, you should have a Pap test every 3 years until you reach menopause or age 43. Women aged 30-60 may choose to have their Pap test done at the same time as an HPV (human papillomavirus) test every 5 years (instead of every 3 years). Your health care provider may recommend having Pap tests more or less often depending on your medical conditions and past Pap test results. What kind of sample is taken?  Your health care provider will collect a sample of cells from the surface of your cervix. This will be done using a small cotton swab, plastic spatula, or brush. This sample is often collected during a pelvic exam, when you are lying on your back on an exam table with feet in footrests (stirrups). In some cases, fluids (secretions) from the cervix or vagina may also be collected. How do I prepare for this test?  Be aware of where you are in your menstrual cycle. If you are menstruating on the day of the test, you may be asked to reschedule.  You may need to reschedule if you have a known vaginal infection on the day of the test.  Follow  instructions from your health care provider about: ? Changing or stopping your regular medicines. Some medicines can cause abnormal test results, such as digitalis and tetracycline. ? Avoiding douching or taking a bath the day before or the day of the test. Tell a health care provider about:  Any allergies you have.  All medicines you are taking, including vitamins, herbs, eye drops, creams, and over-the-counter medicines.  Any blood disorders you have.  Any surgeries you have had.  Any medical conditions you have.  Whether you are pregnant or may be pregnant. How are the results reported? Your test results will be reported as either abnormal or normal. A false-positive result can occur. A false positive is incorrect because it means that a condition is present when it is not. A false-negative result can occur. A false negative is incorrect because it means that a condition is not present when it is. What do the results mean? A normal test result means that you do not have signs of cancer of the vagina, cervix, or uterus. An abnormal result may mean that you have:  Cancer. A Pap test by itself is not enough to diagnose cancer. You  will have more tests done in this case.  Precancerous changes in your vagina, cervix, or uterus.  Inflammation of the cervix.  An STD (sexually transmitted disease).  A fungal infection.  A parasite infection. Talk with your health care provider about what your results mean. Questions to ask your health care provider Ask your health care provider, or the department that is doing the test:  When will my results be ready?  How will I get my results?  What are my treatment options?  What other tests do I need?  What are my next steps? Summary  In general, women should have a Pap test every 3 years until they reach menopause or age 55.  Your health care provider will collect a sample of cells from the surface of your cervix. This will be done  using a small cotton swab, plastic spatula, or brush.  In some cases, fluids (secretions) from the cervix or vagina may also be collected. This information is not intended to replace advice given to you by your health care provider. Make sure you discuss any questions you have with your health care provider. Document Revised: 03/18/2017 Document Reviewed: 03/18/2017 Elsevier Patient Education  2020 [place] 23-63 Years Old, Female Preventive care refers to visits with your health care provider and lifestyle choices that can promote health and wellness. This includes:  A yearly physical exam. This may also be called an annual well check.  Regular dental visits and eye exams.  Immunizations.  Screening for certain conditions.  Healthy lifestyle choices, such as eating a healthy diet, getting regular exercise, not using drugs or products that contain nicotine and tobacco, and limiting alcohol use. What can I expect for my preventive care visit? Physical exam Your health care provider will check your:  Height and weight. This may be used to calculate body mass index (BMI), which tells if you are at a healthy weight.  Heart rate and blood pressure.  Skin for abnormal spots. Counseling Your health care provider may ask you questions about your:  Alcohol, tobacco, and drug use.  Emotional well-being.  Home and relationship well-being.  Sexual activity.  Eating habits.  Work and work Statistician.  Method of birth control.  Menstrual cycle.  Pregnancy history. What immunizations do I need?  Influenza (flu) vaccine  This is recommended every year. Tetanus, diphtheria, and pertussis (Tdap) vaccine  You may need a Td booster every 10 years. Varicella (chickenpox) vaccine  You may need this if you have not been vaccinated. Human papillomavirus (HPV) vaccine  If recommended by your health care provider, you may need three doses over 6  months. Measles, mumps, and rubella (MMR) vaccine  You may need at least one dose of MMR. You may also need a second dose. Meningococcal conjugate (MenACWY) vaccine  One dose is recommended if you are age 57-21 years and a first-year college student living in a residence hall, or if you have one of several medical conditions. You may also need additional booster doses. Pneumococcal conjugate (PCV13) vaccine  You may need this if you have certain conditions and were not previously vaccinated. Pneumococcal polysaccharide (PPSV23) vaccine  You may need one or two doses if you smoke cigarettes or if you have certain conditions. Hepatitis A vaccine  You may need this if you have certain conditions or if you travel or work in places where you may be exposed to hepatitis A. Hepatitis B vaccine  You may need this if  you have certain conditions or if you travel or work in places where you may be exposed to hepatitis B. Haemophilus influenzae type b (Hib) vaccine  You may need this if you have certain conditions. You may receive vaccines as individual doses or as more than one vaccine together in one shot (combination vaccines). Talk with your health care provider about the risks and benefits of combination vaccines. What tests do I need?  Blood tests  Lipid and cholesterol levels. These may be checked every 5 years starting at age 52.  Hepatitis C test.  Hepatitis B test. Screening  Diabetes screening. This is done by checking your blood sugar (glucose) after you have not eaten for a while (fasting).  Sexually transmitted disease (STD) testing.  BRCA-related cancer screening. This may be done if you have a family history of breast, ovarian, tubal, or peritoneal cancers.  Pelvic exam and Pap test. This may be done every 3 years starting at age 66. Starting at age 60, this may be done every 5 years if you have a Pap test in combination with an HPV test. Talk with your health care  provider about your test results, treatment options, and if necessary, the need for more tests. Follow these instructions at home: Eating and drinking   Eat a diet that includes fresh fruits and vegetables, whole grains, lean protein, and low-fat dairy.  Take vitamin and mineral supplements as recommended by your health care provider.  Do not drink alcohol if: ? Your health care provider tells you not to drink. ? You are pregnant, may be pregnant, or are planning to become pregnant.  If you drink alcohol: ? Limit how much you have to 0-1 drink a day. ? Be aware of how much alcohol is in your drink. In the [place] one drink equals one 12 oz bottle of beer (355 mL), one 5 oz glass of wine (148 mL), or one 1 oz glass of hard liquor (44 mL). Lifestyle  Take daily care of your teeth and gums.  Stay active. Exercise for at least 30 minutes on 5 or more days each week.  Do not use any products that contain nicotine or tobacco, such as cigarettes, e-cigarettes, and chewing tobacco. If you need help quitting, ask your health care provider.  If you are sexually active, practice safe sex. Use a condom or other form of birth control (contraception) in order to prevent pregnancy and STIs (sexually transmitted infections). If you plan to become pregnant, see your health care provider for a preconception visit. What's next?  Visit your health care provider once a year for a well check visit.  Ask your health care provider how often you should have your eyes and teeth checked.  Stay up to date on all vaccines. This information is not intended to replace advice given to you by your health care provider. Make sure you discuss any questions you have with your health care provider. Document Revised: 03/20/2018 Document Reviewed: 03/20/2018 Elsevier Patient Education  2020 [employer].

## 2020-05-10 ENCOUNTER — Ambulatory Visit: Payer: Medicaid Other | Admitting: Gastroenterology

## 2020-05-25 ENCOUNTER — Other Ambulatory Visit: Payer: Self-pay

## 2020-05-25 ENCOUNTER — Telehealth: Payer: Self-pay

## 2020-05-25 DIAGNOSIS — Z30018 Encounter for initial prescription of other contraceptives: Secondary | ICD-10-CM

## 2020-05-25 NOTE — Telephone Encounter (Signed)
Pt called in and stated that she would like a prescription of the phexxi but if her insurance doesn't cover the phexxi to please send in a Nuvaring. The pt uses the [facility] on [address].. please advise

## 2020-05-26 MED ORDER — PHEXXI 1.8-1-0.4 % VA GEL
1.0000 | Freq: Once | VAGINAL | 12 refills | Status: DC | PRN
Start: 2020-05-26 — End: 2020-09-08

## 2020-05-26 NOTE — Telephone Encounter (Signed)
Please contact patient and let her know Phexxi was sent to pharmacy on file. If not covered, then contact us and we will try mail order pharmacy. Due to her elevated blood pressure she is not a candidate for the nuvaring. Thanks, JML

## 2020-05-27 ENCOUNTER — Telehealth: Payer: Self-pay

## 2020-05-27 ENCOUNTER — Encounter: Payer: Medicaid Other | Admitting: Certified Nurse Midwife

## 2020-05-27 NOTE — Telephone Encounter (Signed)
Per Dani Gobble, CNM called pt- call not going through.  Phexxi was sent to pharmacy on file

## 2020-05-27 NOTE — Telephone Encounter (Signed)
mychart message sent

## 2020-06-03 ENCOUNTER — Telehealth: Payer: Self-pay

## 2020-06-03 NOTE — Telephone Encounter (Signed)
Pt called in and stated that she was calling back she missed a call. I told the pt I will send a message to the nurse to give you a call back. The pt verbally understood. Please advise

## 2020-06-03 NOTE — Telephone Encounter (Signed)
Returned pt call- rings as invalid number.  myhart message sent .

## 2020-06-24 ENCOUNTER — Encounter: Payer: Medicaid Other | Admitting: Certified Nurse Midwife

## 2020-07-29 ENCOUNTER — Encounter: Payer: Medicaid Other | Admitting: Certified Nurse Midwife

## 2020-09-08 ENCOUNTER — Encounter: Payer: Self-pay | Admitting: Certified Nurse Midwife

## 2020-09-08 ENCOUNTER — Other Ambulatory Visit: Payer: Self-pay

## 2020-09-08 ENCOUNTER — Ambulatory Visit (INDEPENDENT_AMBULATORY_CARE_PROVIDER_SITE_OTHER): Payer: Medicaid Other | Admitting: Certified Nurse Midwife

## 2020-09-08 ENCOUNTER — Other Ambulatory Visit (HOSPITAL_COMMUNITY)
Admission: RE | Admit: 2020-09-08 | Discharge: 2020-09-08 | Disposition: A | Discharge status: Home / Self Care | Payer: Medicaid Other | Source: Ambulatory Visit | Attending: Certified Nurse Midwife | Admitting: Certified Nurse Midwife

## 2020-09-08 VITALS — BP 155/114 | HR 75 | Resp 16 | Ht 63.0 in | Wt 176.7 lb

## 2020-09-08 DIAGNOSIS — Z124 Encounter for screening for malignant neoplasm of cervix: Secondary | ICD-10-CM

## 2020-09-08 DIAGNOSIS — Z01419 Encounter for gynecological examination (general) (routine) without abnormal findings: Secondary | ICD-10-CM | POA: Diagnosis present

## 2020-09-08 DIAGNOSIS — Z113 Encounter for screening for infections with a predominantly sexual mode of transmission: Secondary | ICD-10-CM | POA: Diagnosis present

## 2020-09-08 DIAGNOSIS — Z803 Family history of malignant neoplasm of breast: Secondary | ICD-10-CM | POA: Diagnosis not present

## 2020-09-08 DIAGNOSIS — Z3009 Encounter for other general counseling and advice on contraception: Secondary | ICD-10-CM | POA: Diagnosis not present

## 2020-09-08 DIAGNOSIS — S71039A Puncture wound without foreign body, unspecified hip, initial encounter: Secondary | ICD-10-CM

## 2020-09-08 DIAGNOSIS — L089 Local infection of the skin and subcutaneous tissue, unspecified: Secondary | ICD-10-CM

## 2020-09-08 DIAGNOSIS — I10 Essential (primary) hypertension: Secondary | ICD-10-CM

## 2020-09-08 MED ORDER — NIFEDIPINE ER 30 MG PO TB24: 30.0000 mg | ORAL_TABLET | Freq: Two times a day (BID) | ORAL | 5 refills | Status: DC

## 2020-09-08 MED ORDER — PHEXXI 1.8-1-0.4 % VA GEL: 1.0000 | Freq: Once | VAGINAL | 12 refills | Status: DC | PRN

## 2020-09-08 NOTE — Progress Notes (Addendum)
ANNUAL PREVENTATIVE CARE GYN  ENCOUNTER NOTE  Subjective:       Kristen Chung is a 35 y.o. [phone] female here for a routine annual gynecologic exam.  Current complaints: 1. Needs Pap smear 2. Desires STI screening 3. Medication management-hypertension; "out of meds for the last two (2) months" 4. Infected dermal piercing site  Considering pregnancy with partner, who accompanied patient during discussion portion of the visit.   Denies difficulty breathing or respiratory distress, chest pain, abdominal pain, excessive vaginal bleeding, dysuria, and leg pain or swelling.     Gynecologic History  Patient's last menstrual period was 08/30/2020 (approximate).   Contraception: coitus interruptus and Phexxi  Last Pap: due.   Obstetric History  OB History  Gravida Para Term Preterm AB Living  9 2 1 1 4 2   SAB IAB Ectopic Multiple Live Births  1 3 0 0 2    # Outcome Date GA Lbr Len/2nd Weight Sex Delivery Anes PTL Lv  9 Term 06/12/06   6 lb (2.722 kg) F Vag-Spont EPI Y LIV     Birth Comments: PIH  8 Preterm 08/01/01   2 lb 7 oz (1.106 kg) M Vag-Spont EPI Y LIV     Birth Comments: hosp for 2 months; high BP  7 Gravida           6 Gravida           5 SAB           4 Gravida           3 IAB           2 IAB           1 IAB             Past Medical History:  Diagnosis Date  . Allergy   . Anemia   . Anxiety   . BPPV (benign paroxysmal positional vertigo)   . Chlamydia   . Crohn disease ([place])   . Depression    No specific treatment  . Eczema   . Esophagitis   . Gastritis   . Genital warts   . Gonorrhea   . Hearing loss   . Heart murmur   . Hiatal hernia   . Kidney stone   . PID (acute pelvic inflammatory disease)   . Pregnancy induced hypertension   . Preterm labor   . Substance abuse ([place])   . Urinary tract infection   . Urticaria     Past Surgical History:  Procedure Laterality Date  . abortion     x2    Current Outpatient Medications on File Prior to  Visit  Medication Sig Dispense Refill  . ARIPiprazole (ABILIFY) 5 MG tablet Take 5 mg by mouth every morning.    . cetirizine (ZYRTEC ALLERGY) 10 MG tablet Take 1 tablet (10 mg total) by mouth daily. 30 tablet 5  . diphenhydrAMINE (BENADRYL) 25 mg capsule Take 1 capsule (25 mg total) by mouth every 6 (six) hours as needed. 30 capsule 0  . EPINEPHrine 0.3 mg/0.3 mL IJ SOAJ injection Inject 0.3 mLs (0.3 mg total) into the muscle as needed for anaphylaxis. 1 each 1  . fluticasone (FLONASE) 50 MCG/ACT nasal spray Place 1-2 sprays into both nostrils daily. 16 g 5  . Lactic Ac-Citric Ac-Pot Bitart (PHEXXI) 1.8-1-0.4 % GEL Place 1 Applicatorful vaginally once as needed for up to 1 dose (at least 5 minutes prior to intercourse, no greater than one (1) hour).  5 g 12  . methocarbamol (ROBAXIN) 500 MG tablet Take 1 tablet (500 mg total) by mouth 2 (two) times daily. 20 tablet 0  . NIFEdipine (ADALAT CC) 30 MG 24 hr tablet Take 1 tablet (30 mg total) by mouth in the morning and at bedtime. 60 tablet 5  . esomeprazole (NEXIUM) 20 MG capsule Take 1 capsule (20 mg total) by mouth daily at 12 noon. (Patient not taking: Reported on 09/08/2020) 30 capsule 0   No current facility-administered medications on file prior to visit.    Allergies  Allergen Reactions  . Celery Oil Itching  . Codone [Hydrocodone] Itching  . Dilaudid [Hydromorphone Hcl] Itching  . Hydromorphone Hcl Itching  . Other     Cats  . Strawberry Extract Itching  . Tramadol Itching    Social History   Socioeconomic History  . Marital status: Single    Spouse name: Not on file  . Number of children: 2  . Years of education: Not on file  . Highest education level: Not on file  Occupational History  . Not on file  Tobacco Use  . Smoking status: Current Some Day Smoker    Packs/day: 0.25    Years: 6.00    Pack years: 1.50    Types: Cigarettes  . Smokeless tobacco: Never Used  Vaping Use  . Vaping Use: Never used  Substance and  Sexual Activity  . Alcohol use: Not Currently    Alcohol/week: 9.0 - 16.0 standard drinks    Types: 4 - 6 Cans of beer, 5 - 10 Shots of liquor per week  . Drug use: Not Currently    Types: Marijuana, Cocaine  . Sexual activity: Yes    Birth control/protection: Pull out, Phexxi  Other Topics Concern  . Not on file  Social History Narrative   Lives two children. Recently took out 50-B on the FOB.   Social Determinants of Health   Financial Resource Strain: Not on file  Food Insecurity: Not on file  Transportation Needs: Not on file  Physical Activity: Not on file  Stress: Not on file  Social Connections: Not on file  Intimate Partner Violence: Not on file    Family History  Problem Relation Age of Onset  . Asthma Mother   . Heart disease Mother   . Diabetes Mother   . Hypertension Mother   . Cancer Father        Breast  . Asthma Sister   . Depression Sister   . Diabetes Sister   . Hypertension Sister   . Breast cancer Sister   . Diabetes Other   . Anesthesia problems Neg Hx     The following portions of the patient's history were reviewed and updated as appropriate: allergies, current medications, past family history, past medical history, past social history, past surgical history and problem list.  Review of Systems  ROS negative except as noted above. Information obtained from patient.    Objective:   BP (!) 155/114   Pulse 75   Resp 16   Ht 5' 3"  (1.6 m)   Wt 176 lb 11.2 oz (80.2 kg)   LMP 08/30/2020 (Approximate)   BMI 31.30 kg/m    CONSTITUTIONAL: Well-developed, well-nourished female in no acute distress.   PSYCHIATRIC: Normal mood and affect. Normal behavior. Normal judgment and thought content.  [place]: Alert and oriented to person, place, and time. Normal muscle tone coordination. No cranial nerve deficit noted.  HENT:  Normocephalic, atraumatic.  EYES: Conjunctivae  and EOM are normal.   NECK: Normal range of motion, supple, no masses.   Normal thyroid.   SKIN: Skin is warm and dry. No rash noted. Not diaphoretic. No pallor. Infected dermal piercing to lower back. Professional tattoos present.   CARDIOVASCULAR: Normal heart rate noted, regular rhythm, no murmur.  RESPIRATORY: Clear to auscultation bilaterally. Effort and breath sounds normal, no problems with respiration noted.  BREASTS: Symmetric in size. No masses, skin changes, nipple drainage, or lymphadenopathy. Bilateral nipple piercings noted.   ABDOMEN: Soft, normal bowel sounds, no distention noted.  No tenderness, rebound or guarding. Navel piercing present.   PELVIC:  External Genitalia: Normal  Vagina: Normal  Cervix: Normal, Pap collected  Uterus: Normal  Adnexa: Normal  MUSCULOSKELETAL: Normal range of motion. No tenderness.  No cyanosis, clubbing, or edema.  2+ distal pulses.  LYMPHATIC: No Axillary, Supraclavicular, or Inguinal Adenopathy.  Assessment:   Annual gynecologic examination 35 y.o.   Contraception: coitus interruptus and Phexxi    Obesity 1   Problem List Items Addressed This Visit      Cardiovascular and Mediastinum   Essential hypertension   Relevant Medications   NIFEdipine (ADALAT CC) 30 MG 24 hr tablet    Other Visit Diagnoses    Well woman exam    -  Primary   Relevant Orders   Cytology - PAP (Completed)   Cervicovaginal ancillary only (Completed)   Screening for cervical cancer       Relevant Orders   Cytology - PAP (Completed)   Encounter for counseling regarding contraception       Family history of breast cancer       Routine screening for STI (sexually transmitted infection)       Relevant Orders   Cervicovaginal ancillary only (Completed)   Infected pierced hip, initial encounter          Plan:   Pap: Pap Co Test  Labs: See orders  Routine preventative health maintenance measures emphasized: Exercise/Diet/Weight control, Tobacco Warnings, Alcohol/Substance use risks, Stress Management, Peer Pressure  Issues and Safe Sex; see AVS  Rx Procardia and Phexxi, see orders  Discouraged pregnancy until blood pressure are with in normal limits due to increased risks; patient and significant other verbalized understanding  Encouraged patient to follow up with Dermatologist or Surgeon for removal of infected dermal piercing  Reviewed red flag symptoms and when to call  Return to Clinic - 1 Year for [employer] or sooner if needed   Dani Gobble, CNM  [facility], [facility] 09/08/20 9:36 AM

## 2020-09-08 NOTE — Patient Instructions (Addendum)
Pap Test Why am I having this test? A Pap test, also called a Pap smear, is a screening test to check for signs of:  Cancer of the vagina, cervix, and uterus. The cervix is the lower part of the uterus that opens into the vagina.  Infection.  Changes that may be a sign that cancer is developing (precancerous changes). Women need this test on a regular basis. In general, you should have a Pap test every 3 years until you reach menopause or age 34. Women aged 30-60 may choose to have their Pap test done at the same time as an HPV (human papillomavirus) test every 5 years (instead of every 3 years). Your health care provider may recommend having Pap tests more or less often depending on your medical conditions and past Pap test results. What kind of sample is taken? Your health care provider will collect a sample of cells from the surface of your cervix. This will be done using a small cotton swab, plastic spatula, or brush. This sample is often collected during a pelvic exam, when you are lying on your back on an exam table with feet in footrests (stirrups). In some cases, fluids (secretions) from the cervix or vagina may also be collected.   How do I prepare for this test?  Be aware of where you are in your menstrual cycle. If you are menstruating on the day of the test, you may be asked to reschedule.  You may need to reschedule if you have a known vaginal infection on the day of the test.  Follow instructions from your health care provider about: ? Changing or stopping your regular medicines. Some medicines can cause abnormal test results, such as digitalis and tetracycline. ? Avoiding douching or taking a bath the day before or the day of the test. Tell a health care provider about:  Any allergies you have.  All medicines you are taking, including vitamins, herbs, eye drops, creams, and over-the-counter medicines.  Any blood disorders you have.  Any surgeries you have had.  Any  medical conditions you have.  Whether you are pregnant or may be pregnant. How are the results reported? Your test results will be reported as either abnormal or normal. A false-positive result can occur. A false positive is incorrect because it means that a condition is present when it is not. A false-negative result can occur. A false negative is incorrect because it means that a condition is not present when it is. What do the results mean? A normal test result means that you do not have signs of cancer of the vagina, cervix, or uterus. An abnormal result may mean that you have:  Cancer. A Pap test by itself is not enough to diagnose cancer. You will have more tests done in this case.  Precancerous changes in your vagina, cervix, or uterus.  Inflammation of the cervix.  An STD (sexually transmitted disease).  A fungal infection.  A parasite infection. Talk with your health care provider about what your results mean. Questions to ask your health care provider Ask your health care provider, or the department that is doing the test:  When will my results be ready?  How will I get my results?  What are my treatment options?  What other tests do I need?  What are my next steps? Summary  In general, women should have a Pap test every 3 years until they reach menopause or age 29.  Your health care provider will collect  a sample of cells from the surface of your cervix. This will be done using a small cotton swab, plastic spatula, or brush.  In some cases, fluids (secretions) from the cervix or vagina may also be collected. This information is not intended to replace advice given to you by your health care provider. Make sure you discuss any questions you have with your health care provider. Document Revised: 03/16/2020 Document Reviewed: 03/11/2020 Elsevier Patient Education  2021 [employer].   Hypertension, Adult Hypertension is another name for high blood pressure.  High blood pressure forces your heart to work harder to pump blood. This can cause problems over time. There are two numbers in a blood pressure reading. There is a top number (systolic) over a bottom number (diastolic). It is best to have a blood pressure that is below 120/80. Healthy choices can help lower your blood pressure, or you may need medicine to help lower it. What are the causes? The cause of this condition is not known. Some conditions may be related to high blood pressure. What increases the risk?  Smoking.  Having type 2 diabetes mellitus, high cholesterol, or both.  Not getting enough exercise or physical activity.  Being overweight.  Having too much fat, sugar, calories, or salt (sodium) in your diet.  Drinking too much alcohol.  Having long-term (chronic) kidney disease.  Having a family history of high blood pressure.  Age. Risk increases with age.  Race. You may be at higher risk if you are African American.  Gender. Men are at higher risk than women before age 95. After age 48, women are at higher risk than men.  Having obstructive sleep apnea.  Stress. What are the signs or symptoms?  High blood pressure may not cause symptoms. Very high blood pressure (hypertensive crisis) may cause: ? Headache. ? Feelings of worry or nervousness (anxiety). ? Shortness of breath. ? Nosebleed. ? A feeling of being sick to your stomach (nausea). ? Throwing up (vomiting). ? Changes in how you see. ? Very bad chest pain. ? Seizures. How is this treated?  This condition is treated by making healthy lifestyle changes, such as: ? Eating healthy foods. ? Exercising more. ? Drinking less alcohol.  Your health care provider may prescribe medicine if lifestyle changes are not enough to get your blood pressure under control, and if: ? Your top number is above 130. ? Your bottom number is above 80.  Your personal target blood pressure may vary. Follow these  instructions at home: Eating and drinking  If told, follow the DASH eating plan. To follow this plan: ? Fill one half of your plate at each meal with fruits and vegetables. ? Fill one fourth of your plate at each meal with whole grains. Whole grains include whole-wheat pasta, brown rice, and whole-grain bread. ? Eat or drink low-fat dairy products, such as skim milk or low-fat yogurt. ? Fill one fourth of your plate at each meal with low-fat (lean) proteins. Low-fat proteins include fish, chicken without skin, eggs, beans, and tofu. ? Avoid fatty meat, cured and processed meat, or chicken with skin. ? Avoid pre-made or processed food.  Eat less than 1,500 mg of salt each day.  Do not drink alcohol if: ? Your doctor tells you not to drink. ? You are pregnant, may be pregnant, or are planning to become pregnant.  If you drink alcohol: ? Limit how much you use to:  0-1 drink a day for women.  0-2 drinks a  day for men. ? Be aware of how much alcohol is in your drink. In the [place] one drink equals one 12 oz bottle of beer (355 mL), one 5 oz glass of wine (148 mL), or one 1 oz glass of hard liquor (44 mL).   Lifestyle  Work with your doctor to stay at a healthy weight or to lose weight. Ask your doctor what the best weight is for you.  Get at least 30 minutes of exercise most days of the week. This may include walking, swimming, or biking.  Get at least 30 minutes of exercise that strengthens your muscles (resistance exercise) at least 3 days a week. This may include lifting weights or doing Pilates.  Do not use any products that contain nicotine or tobacco, such as cigarettes, e-cigarettes, and chewing tobacco. If you need help quitting, ask your doctor.  Check your blood pressure at home as told by your doctor.  Keep all follow-up visits as told by your doctor. This is important.   Medicines  Take over-the-counter and prescription medicines only as told by your doctor. Follow  directions carefully.  Do not skip doses of blood pressure medicine. The medicine does not work as well if you skip doses. Skipping doses also puts you at risk for problems.  Ask your doctor about side effects or reactions to medicines that you should watch for. Contact a doctor if you:  Think you are having a reaction to the medicine you are taking.  Have headaches that keep coming back (recurring).  Feel dizzy.  Have swelling in your ankles.  Have trouble with your vision. Get help right away if you:  Get a very bad headache.  Start to feel mixed up (confused).  Feel weak or numb.  Feel faint.  Have very bad pain in your: ? Chest. ? Belly (abdomen).  Throw up more than once.  Have trouble breathing. Summary  Hypertension is another name for high blood pressure.  High blood pressure forces your heart to work harder to pump blood.  For most people, a normal blood pressure is less than 120/80.  Making healthy choices can help lower blood pressure. If your blood pressure does not get lower with healthy choices, you may need to take medicine. This information is not intended to replace advice given to you by your health care provider. Make sure you discuss any questions you have with your health care provider. Document Revised: 03/19/2018 Document Reviewed: 03/19/2018 Elsevier Patient Education  [place] 75-25 Years Old, Female Preventive care refers to lifestyle choices and visits with your health care provider that can promote health and wellness. This includes:  A yearly physical exam. This is also called an annual wellness visit.  Regular dental and eye exams.  Immunizations.  Screening for certain conditions.  Healthy lifestyle choices, such as: ? Eating a healthy diet. ? Getting regular exercise. ? Not using drugs or products that contain nicotine and tobacco. ? Limiting alcohol use. What can I expect for my preventive  care visit? Physical exam Your health care provider may check your:  Height and weight. These may be used to calculate your BMI (body mass index). BMI is a measurement that tells if you are at a healthy weight.  Heart rate and blood pressure.  Body temperature.  Skin for abnormal spots. Counseling Your health care provider may ask you questions about your:  Past medical problems.  Family's medical history.  Alcohol, tobacco, and drug  use.  Emotional well-being.  Home life and relationship well-being.  Sexual activity.  Diet, exercise, and sleep habits.  Work and work Statistician.  Access to firearms.  Method of birth control.  Menstrual cycle.  Pregnancy history. What immunizations do I need? Vaccines are usually given at various ages, according to a schedule. Your health care provider will recommend vaccines for you based on your age, medical history, and lifestyle or other factors, such as travel or where you work.   What tests do I need? Blood tests  Lipid and cholesterol levels. These may be checked every 5 years starting at age 69.  Hepatitis C test.  Hepatitis B test. Screening  Diabetes screening. This is done by checking your blood sugar (glucose) after you have not eaten for a while (fasting).  STD (sexually transmitted disease) testing, if you are at risk.  BRCA-related cancer screening. This may be done if you have a family history of breast, ovarian, tubal, or peritoneal cancers.  Pelvic exam and Pap test. This may be done every 3 years starting at age 79. Starting at age 15, this may be done every 5 years if you have a Pap test in combination with an HPV test. Talk with your health care provider about your test results, treatment options, and if necessary, the need for more tests.   Follow these instructions at home: Eating and drinking  Eat a healthy diet that includes fresh fruits and vegetables, whole grains, lean protein, and low-fat dairy  products.  Take vitamin and mineral supplements as recommended by your health care provider.  Do not drink alcohol if: ? Your health care provider tells you not to drink. ? You are pregnant, may be pregnant, or are planning to become pregnant.  If you drink alcohol: ? Limit how much you have to 0-1 drink a day. ? Be aware of how much alcohol is in your drink. In the [place] one drink equals one 12 oz bottle of beer (355 mL), one 5 oz glass of wine (148 mL), or one 1 oz glass of hard liquor (44 mL).   Lifestyle  Take daily care of your teeth and gums. Brush your teeth every morning and night with fluoride toothpaste. Floss one time each day.  Stay active. Exercise for at least 30 minutes 5 or more days each week.  Do not use any products that contain nicotine or tobacco, such as cigarettes, e-cigarettes, and chewing tobacco. If you need help quitting, ask your health care provider.  Do not use drugs.  If you are sexually active, practice safe sex. Use a condom or other form of protection to prevent STIs (sexually transmitted infections).  If you do not wish to become pregnant, use a form of birth control. If you plan to become pregnant, see your health care provider for a prepregnancy visit.  Find healthy ways to cope with stress, such as: ? Meditation, yoga, or listening to music. ? Journaling. ? Talking to a trusted person. ? Spending time with friends and family. Safety  Always wear your seat belt while driving or riding in a vehicle.  Do not drive: ? If you have been drinking alcohol. Do not ride with someone who has been drinking. ? When you are tired or distracted. ? While texting.  Wear a helmet and other protective equipment during sports activities.  If you have firearms in your house, make sure you follow all gun safety procedures.  Seek help if you have been physically or  sexually abused. What's next?  Go to your health care provider once a year for an annual  wellness visit.  Ask your health care provider how often you should have your eyes and teeth checked.  Stay up to date on all vaccines. This information is not intended to replace advice given to you by your health care provider. Make sure you discuss any questions you have with your health care provider. Document Revised: 03/06/2020 Document Reviewed: 03/20/2018 Elsevier Patient Education  2021 [employer].

## 2020-09-09 LAB — CERVICOVAGINAL ANCILLARY ONLY
Bacterial Vaginitis (gardnerella): POSITIVE — AB
Candida Glabrata: NEGATIVE
Candida Vaginitis: NEGATIVE
Chlamydia: NEGATIVE
Comment: NEGATIVE
Comment: NEGATIVE
Comment: NEGATIVE
Comment: NEGATIVE
Comment: NEGATIVE
Comment: NORMAL
Neisseria Gonorrhea: NEGATIVE
Trichomonas: NEGATIVE

## 2020-09-09 LAB — CYTOLOGY - PAP
Comment: NEGATIVE
Diagnosis: NEGATIVE
High risk HPV: NEGATIVE

## 2020-09-10 ENCOUNTER — Other Ambulatory Visit: Payer: Self-pay | Admitting: Certified Nurse Midwife

## 2020-09-10 DIAGNOSIS — N760 Acute vaginitis: Secondary | ICD-10-CM

## 2020-09-10 DIAGNOSIS — B9689 Other specified bacterial agents as the cause of diseases classified elsewhere: Secondary | ICD-10-CM

## 2020-09-10 MED ORDER — METRONIDAZOLE 500 MG PO TABS: 500.0000 mg | ORAL_TABLET | Freq: Two times a day (BID) | ORAL | 0 refills | Status: AC

## 2020-09-10 NOTE — Progress Notes (Signed)
Rx: Flagyl, see orders.    Dani Gobble, CNM [facility], [facility] 09/10/20 6:02 PM

## 2020-10-03 ENCOUNTER — Ambulatory Visit (INDEPENDENT_AMBULATORY_CARE_PROVIDER_SITE_OTHER): Payer: Medicaid Other | Admitting: Surgical

## 2020-10-03 ENCOUNTER — Other Ambulatory Visit: Payer: Self-pay

## 2020-10-03 VITALS — BP 114/76 | HR 75 | Ht 63.0 in | Wt 176.0 lb

## 2020-10-03 DIAGNOSIS — Z0130 Encounter for examination of blood pressure without abnormal findings: Secondary | ICD-10-CM | POA: Diagnosis not present

## 2020-10-03 NOTE — Progress Notes (Addendum)
Patient comes in today for BP check. BP in Left arm is 114/76. Kristen Chung was given the BP results.

## 2020-10-06 NOTE — Progress Notes (Signed)
I have reviewed the record and concur with patient management and plan of care.    Dani Gobble, CNM [facility], [facility] 10/06/20 10:17 AM

## 2020-10-07 ENCOUNTER — Ambulatory Visit (INDEPENDENT_AMBULATORY_CARE_PROVIDER_SITE_OTHER): Payer: Medicaid Other | Admitting: Certified Nurse Midwife

## 2020-10-07 ENCOUNTER — Encounter: Payer: Self-pay | Admitting: Certified Nurse Midwife

## 2020-10-07 ENCOUNTER — Other Ambulatory Visit: Payer: Self-pay

## 2020-10-07 ENCOUNTER — Other Ambulatory Visit (HOSPITAL_COMMUNITY)
Admission: RE | Admit: 2020-10-07 | Discharge: 2020-10-07 | Disposition: A | Discharge status: Home / Self Care | Payer: Medicaid Other | Source: Ambulatory Visit | Attending: Certified Nurse Midwife | Admitting: Certified Nurse Midwife

## 2020-10-07 VITALS — BP 117/79 | HR 84 | Ht 63.0 in | Wt 177.4 lb

## 2020-10-07 DIAGNOSIS — Z8742 Personal history of other diseases of the female genital tract: Secondary | ICD-10-CM

## 2020-10-07 DIAGNOSIS — N898 Other specified noninflammatory disorders of vagina: Secondary | ICD-10-CM | POA: Insufficient documentation

## 2020-10-07 DIAGNOSIS — Z113 Encounter for screening for infections with a predominantly sexual mode of transmission: Secondary | ICD-10-CM | POA: Insufficient documentation

## 2020-10-07 MED ORDER — SOLOSEC 2 G PO PACK
1.0000 | PACK | Freq: Once | ORAL | 0 refills | Status: AC
Start: 2020-10-07 — End: 2020-10-07

## 2020-10-07 MED ORDER — FLUCONAZOLE 150 MG PO TABS: 150.0000 mg | ORAL_TABLET | Freq: Once | ORAL | 0 refills | Status: AC

## 2020-10-07 NOTE — Progress Notes (Signed)
GYN ENCOUNTER NOTE  Subjective:       Kristen Chung is a 35 y.o. [phone] female is here for gynecologic evaluation of the following issues:  1. Vaginal irritation, itching and discharge for the last few days; no relief with home treatment measures including monistat  Questions herpes outbreak, reports single outbreak in teenage years  Denies difficulty breathing or respiratory distress, chest pain, abdominal pain, excessive vaginal bleeding, dysuria, and leg pain or swelling.    Gynecologic History  Patient's last menstrual period was 09/23/2020 (approximate).  Contraception: none and Phexxi  Last Pap: 08/2020. Results were: Neg/Neg  Obstetric History  OB History  Gravida Para Term Preterm AB Living  9 2 1 1 4 2   SAB IAB Ectopic Multiple Live Births  1 3 0 0 2    # Outcome Date GA Lbr Len/2nd Weight Sex Delivery Anes PTL Lv  9 Term 06/12/06   6 lb (2.722 kg) F Vag-Spont EPI Y LIV     Birth Comments: PIH  8 Preterm 08/01/01   2 lb 7 oz (1.106 kg) M Vag-Spont EPI Y LIV     Birth Comments: hosp for 2 months; high BP  7 Gravida           6 Gravida           5 SAB           4 Gravida           3 IAB           2 IAB           1 IAB             Past Medical History:  Diagnosis Date  . Allergy   . Anemia   . Anxiety   . BPPV (benign paroxysmal positional vertigo)   . Chlamydia   . Crohn disease ([place])   . Depression    No specific treatment  . Eczema   . Esophagitis   . Gastritis   . Genital warts   . Gonorrhea   . Hearing loss   . Heart murmur   . Hiatal hernia   . Kidney stone   . PID (acute pelvic inflammatory disease)   . Pregnancy induced hypertension   . Preterm labor   . Substance abuse ([place])   . Urinary tract infection   . Urticaria     Past Surgical History:  Procedure Laterality Date  . abortion     x2    Current Outpatient Medications on File Prior to Visit  Medication Sig Dispense Refill  . ARIPiprazole (ABILIFY) 5 MG tablet Take 5 mg by  mouth every morning.    . cetirizine (ZYRTEC ALLERGY) 10 MG tablet Take 1 tablet (10 mg total) by mouth daily. 30 tablet 5  . diphenhydrAMINE (BENADRYL) 25 mg capsule Take 1 capsule (25 mg total) by mouth every 6 (six) hours as needed. 30 capsule 0  . EPINEPHrine 0.3 mg/0.3 mL IJ SOAJ injection Inject 0.3 mLs (0.3 mg total) into the muscle as needed for anaphylaxis. 1 each 1  . fluticasone (FLONASE) 50 MCG/ACT nasal spray Place 1-2 sprays into both nostrils daily. 16 g 5  . Lactic Ac-Citric Ac-Pot Bitart (PHEXXI) 1.8-1-0.4 % GEL Place 1 Applicatorful vaginally once as needed for up to 1 dose (at least 5 minutes prior to intercourse, no greater than one (1) hour). 5 g 12  . methocarbamol (ROBAXIN) 500 MG tablet Take 1 tablet (500 mg total)  by mouth 2 (two) times daily. 20 tablet 0  . NIFEdipine (ADALAT CC) 30 MG 24 hr tablet Take 1 tablet (30 mg total) by mouth in the morning and at bedtime. 60 tablet 5  . esomeprazole (NEXIUM) 20 MG capsule Take 1 capsule (20 mg total) by mouth daily at 12 noon. (Patient not taking: No sig reported) 30 capsule 0   No current facility-administered medications on file prior to visit.    Allergies  Allergen Reactions  . Celery Oil Itching  . Codone [Hydrocodone] Itching  . Dilaudid [Hydromorphone Hcl] Itching  . Hydromorphone Hcl Itching  . Other     Cats  . Strawberry Extract Itching  . Tramadol Itching    Social History   Socioeconomic History  . Marital status: Single    Spouse name: Not on file  . Number of children: 2  . Years of education: Not on file  . Highest education level: Not on file  Occupational History  . Not on file  Tobacco Use  . Smoking status: Current Some Day Smoker    Packs/day: 0.25    Years: 6.00    Pack years: 1.50    Types: Cigarettes  . Smokeless tobacco: Never Used  Vaping Use  . Vaping Use: Never used  Substance and Sexual Activity  . Alcohol use: Not Currently    Alcohol/week: 9.0 - 16.0 standard drinks     Types: 4 - 6 Cans of beer, 5 - 10 Shots of liquor per week  . Drug use: Not Currently    Types: Marijuana, Cocaine  . Sexual activity: Yes    Birth control/protection: Phexxi  Other Topics Concern  . Not on file  Social History Narrative   Lives two children.   Social Determinants of Health   Financial Resource Strain: Not on file  Food Insecurity: Not on file  Transportation Needs: Not on file  Physical Activity: Not on file  Stress: Not on file  Social Connections: Not on file  Intimate Partner Violence: Not on file    Family History  Problem Relation Age of Onset  . Asthma Mother   . Heart disease Mother   . Diabetes Mother   . Hypertension Mother   . Cancer Father        Breast  . Asthma Sister   . Depression Sister   . Diabetes Sister   . Hypertension Sister   . Breast cancer Sister   . Diabetes Other   . Anesthesia problems Neg Hx     The following portions of the patient's history were reviewed and updated as appropriate: allergies, current medications, past family history, past medical history, past social history, past surgical history and problem list.  Review of Systems  ROS negative except as noted above. Information obtained from patient.   Objective:   BP 117/79   Pulse 84   Ht 5' 3"  (1.6 m)   Wt 177 lb 6.4 oz (80.5 kg)   LMP 09/23/2020 (Approximate)   Breastfeeding No   BMI 31.42 kg/m    CONSTITUTIONAL: Well-developed, well-nourished female in no acute distress.   PELVIC:  External Genitalia: Thick white discharge caked in labia folds  Vagina: Whitish green, thick, curd like discharge present  Cervix: Swab collected   MUSCULOSKELETAL: Normal range of motion. No tenderness.  No cyanosis, clubbing, or edema.  Assessment:   1. History of vaginitis  - Cervicovaginal ancillary only  2. Vaginal itching  - Cervicovaginal ancillary only  3. Vaginal irritation  -  Cervicovaginal ancillary only  4. Vaginal discharge  - Cervicovaginal  ancillary only  5. Routine screening for STI (sexually transmitted infection)  - Cervicovaginal ancillary only     Plan:   Vaginal swab collected, see orders.   Rx Diflucan and Solosec; see orders.   Patient may have Flagyl for treatment of BV if Solosec not covered by insurance.   Reviewed red flag symptoms and when to call.   RTC as previously scheduled or sooner if needed.    Dani Gobble, CNM [facility], [facility] 10/07/20 3:36 PM

## 2020-10-07 NOTE — Patient Instructions (Signed)
Vaginitis  Vaginitis is irritation and swelling of the vagina. Treatment will depend on the cause. What are the causes? It can be caused by:  Bacteria.  Yeast.  A parasite.  A virus.  Low hormone levels.  Bubble baths, scented tampons, and feminine sprays. Other things can change the balance of the yeast and bacteria that live in the vagina. These include:  Antibiotic medicines.  Not being clean enough.  Some birth control methods.  Sex.  Infection.  Diabetes.  A weakened body defense system (immune system). What increases the risk?  Smoking or being around someone who smokes.  Using washes (douches), scented tampons, or scented pads.  Wearing tight pants or thong underwear.  Using birth control pills or an IUD.  Having sex without a condom or having a lot of partners.  Having an STI.  Using a certain product to kill sperm (nonoxynol-9).  Eating foods that are high in sugar.  Having diabetes.  Having low levels of a female hormone.  Having a weakened body defense system.  Being pregnant or breastfeeding. What are the signs or symptoms?  Fluid coming from the vagina that is not normal.  A bad smell.  Itching, pain, or swelling.  Pain with sex.  Pain or burning when you pee (urinate). Sometimes there are no symptoms. How is this treated? Treatment may include:  Antibiotic creams or pills.  Antifungal medicines.  Medicines to ease symptoms if you have a virus. Your sex partner should also be treated.  Estrogen medicines.  Avoiding scented soaps, sprays, or douches.  Stopping use of products that caused irritation and then using a cream to treat symptoms. Follow these instructions at home: Lifestyle  Keep the area around your vagina clean and dry. ? Avoid using soap. ? Rinse the area with water.  Until your doctor says it is okay: ? Do not use washes for the vagina. ? Do not use tampons. ? Do not have sex.  Wipe from front to  back after going to the bathroom.  When your doctor says it is okay, practice safe sex and use condoms. General instructions  Take over-the-counter and prescription medicines only as told by your doctor.  If you were prescribed an antibiotic medicine, take or use it as told by your doctor. Do not stop taking or using it even if you start to feel better.  Keep all follow-up visits. How is this prevented?  Do not use things that can irritate the vagina, such as fabric softeners. Avoid these products if they are scented: ? Sprays. ? Detergents. ? Tampons. ? Products for cleaning the vagina. ? Soaps or bubble baths.  Let air reach your vagina. To do this: ? Wear cotton underwear. ? Do not wear:  Underwear while you sleep.  Tight pants.  Thong underwear.  Underwear or nylons without a cotton panel. ? Take off any wet clothing, such as bathing suits, as soon as you can. ? Practice safe sex and use condoms. Contact a doctor if:  You have pain in your belly or in the area between your hips.  You have a fever or chills.  Your symptoms last for more than 2-3 days. Get help right away if:  You have a fever and your symptoms get worse all of a sudden. Summary  Vaginitis is irritation and swelling of the vagina.  Treatment will depend on the cause of the condition.  Do not use washes or tampons or have sex until your doctor says it  is okay. This information is not intended to replace advice given to you by your health care provider. Make sure you discuss any questions you have with your health care provider. Document Revised: 01/07/2020 Document Reviewed: 01/07/2020 Elsevier Patient Education  2021 [place].   Fluconazole tablets What is this medicine? FLUCONAZOLE (floo KON na zole) is an antifungal medicine. It is used to treat certain kinds of fungal or yeast infections. This medicine may be used for other purposes; ask your health care provider or pharmacist if you  have questions. COMMON BRAND NAME(S): Diflucan What should I tell my health care provider before I take this medicine? They need to know if you have any of these conditions:  irregular heartbeat or rhythm  kidney disease  liver disease  low levels of potassium in the blood  an unusual or allergic reaction to fluconazole, other azole antifungals, medicines, foods, dyes, or preservatives  pregnant or trying to get pregnant  breast-feeding How should I use this medicine? Take this medicine by mouth. Follow the directions on the prescription label. Do not take your medicine more often than directed. Talk to your pediatrician regarding the use of this medicine in children. Special care may be needed. This medicine has been used in children as young as 59 months of age. Overdosage: If you think you have taken too much of this medicine contact a poison control center or emergency room at once. NOTE: This medicine is only for you. Do not share this medicine with others. What if I miss a dose? If you miss a dose, take it as soon as you can. If it is almost time for your next dose, take only that dose. Do not take double or extra doses. What may interact with this medicine? Do not take this medicine with any of the following medications:  flibanserin  lomitapide  lonafarnib  other medicines that prolong the QT interval (cause an abnormal heart rhythm)  triazolam This medicine may also interact with the following medications:  certain antibiotics like rifabutin, rifampin  certain antivirals for HIV or hepatitis  certain medicines for blood pressure, heart disease, irregular heartbeat  certain medicines for cholesterol like atorvastatin, lovastatin, and simvastatin  certain medicines for depression, like amitriptyline, nortriptyline  certain medicines for diabetes like glipizide or glyburide  certain medicines for seizures like carbamazepine, phenytoin  certain medicines that  treat or prevent blood clots like warfarin  certain narcotic medicines for pain like alfentanil, fentanyl, methadone  cyclophosphamide  cyclosporine  ibrutinib  lemborexant  midazolam  NSAIDS, medicines for pain and inflammation, like ibuprofen or naproxen  olaparib  sirolimus  steroid medicines like prednisone  tacrolimus  theophylline  tofacitinib  tolvaptan  vinblastine  vincristine  vitamin A  voriconazole This list may not describe all possible interactions. Give your health care provider a list of all the medicines, herbs, non-prescription drugs, or dietary supplements you use. Also tell them if you smoke, drink alcohol, or use illegal drugs. Some items may interact with your medicine. What should I watch for while using this medicine? Visit your doctor or health care professional for regular checkups. If you are taking this medicine for a long time you may need blood work. Tell your doctor if your symptoms do not improve. Some fungal infections need many weeks or months of treatment to cure. Alcohol can increase possible damage to your liver. Avoid alcoholic drinks. If you have a vaginal infection, do not have sex until you have finished your treatment. You  can wear a sanitary napkin. Do not use tampons. Wear freshly washed cotton, not synthetic, panties. What side effects may I notice from receiving this medicine? Side effects that you should report to your doctor or health care professional as soon as possible:  allergic reactions like skin rash or itching, hives, swelling of the lips, mouth, tongue, or throat  dark urine  feeling dizzy or faint  irregular heartbeat or chest pain  redness, blistering, peeling or loosening of the skin, including inside the mouth  trouble breathing  unusual bruising or bleeding  vomiting  yellowing of the eyes or skin Side effects that usually do not require medical attention (report to your doctor or health care  professional if they continue or are bothersome):  changes in how food tastes  diarrhea  headache  stomach upset or nausea This list may not describe all possible side effects. Call your doctor for medical advice about side effects. You may report side effects to FDA at 1-800-FDA-1088. Where should I keep my medicine? Keep out of the reach of children. Store at room temperature below 30 degrees C (86 degrees F). Throw away any medicine after the expiration date. NOTE: This sheet is a summary. It may not cover all possible information. If you have questions about this medicine, talk to your doctor, pharmacist, or health care provider.  2021 Elsevier/Gold Standard (2020-05-18 08:51:42)   Secnidazole oral granules What is this medicine? SECNIDAZOLE (sek NID a zole) is an antiinfective. It is used to treat certain kinds of bacterial and protozoal infections. It will not work for colds, flu, or other viral infections. This medicine may be used for other purposes; ask your health care provider or pharmacist if you have questions. COMMON BRAND NAME(S): SOLOSEC What should I tell my health care provider before I take this medicine? They need to know if you have any of these conditions:  if you often drink alcohol  an unusual or allergic reaction to secnidazole, other medicines, foods, dyes, or preservatives  pregnant or trying to get pregnant  breast-feeding How should I use this medicine? Take this medicine by mouth. Follow the directions on the prescription label. Do not crush or chew this medicine. You can take it with or without food. If it upsets your stomach, take it with food. Take all of your medicine as directed. Talk to your pediatrician regarding the use of this medicine in children. Special care may be needed. Overdosage: If you think you have taken too much of this medicine contact a poison control center or emergency room at once. NOTE: This medicine is only for you. Do not  share this medicine with others. What if I miss a dose? This does not apply; this medicine is not for regular use. What may interact with this medicine? Do not take this medicine with any of the following medications:  alcohol or any product that contains alcohol This list may not describe all possible interactions. Give your health care provider a list of all the medicines, herbs, non-prescription drugs, or dietary supplements you use. Also tell them if you smoke, drink alcohol, or use illegal drugs. Some items may interact with your medicine. What should I watch for while using this medicine? Tell your doctor or healthcare professional if your symptoms do not start to get better or if they get worse. Some products may contain alcohol. Ask your health care provider if your medicines contain alcohol. Be sure to tell all health care providers you are taking this  medicine. What side effects may I notice from receiving this medicine? Side effects that you should report to your doctor or health care professional as soon as possible:  allergic reactions (skin rash, itching or hives, swelling of the face, lips, or tongue) Side effects that usually do not require medical attention (report these to your doctor or health care professional if they continue or are bothersome):  changes in taste  diarrhea  headache  nausea, vomiting  stomach pain  vaginal discharge, itching, or odor in women This list may not describe all possible side effects. Call your doctor for medical advice about side effects. You may report side effects to FDA at 1-800-FDA-1088. Where should I keep my medicine? Keep out of the reach of children and pets. Store at room temperature between 15 and 30 degrees C (59 and 86 degrees F). Get rid of any unused medicine after the expiration date. To get rid of medicines that are no longer needed or have expired:  Take the medicine to a medicine take-back program. Check with your  pharmacy or law enforcement to find a location.  If you cannot return the medicine, check the label or package insert to see if the medicine should be thrown out in the garbage or flushed down the toilet. If you are not sure, ask your health care provider. If it is safe to put it in the trash, take the medicine out of the container. Mix the medicine with cat litter, dirt, coffee grounds, or other unwanted substance. Seal the mixture in a bag or container. Put it in the trash. NOTE: This sheet is a summary. It may not cover all possible information. If you have questions about this medicine, talk to your doctor, pharmacist, or health care provider.  2021 Elsevier/Gold Standard (2020-01-21 10:01:45)

## 2020-10-10 ENCOUNTER — Other Ambulatory Visit: Payer: Self-pay

## 2020-10-10 MED ORDER — METRONIDAZOLE 500 MG PO TABS: 500.0000 mg | ORAL_TABLET | Freq: Two times a day (BID) | ORAL | 0 refills | Status: DC

## 2020-10-11 LAB — CERVICOVAGINAL ANCILLARY ONLY
Bacterial Vaginitis (gardnerella): NEGATIVE
Candida Glabrata: NEGATIVE
Candida Vaginitis: POSITIVE — AB
Chlamydia: NEGATIVE
Comment: NEGATIVE
Comment: NEGATIVE
Comment: NEGATIVE
Comment: NEGATIVE
Comment: NEGATIVE
Comment: NORMAL
Neisseria Gonorrhea: NEGATIVE
Trichomonas: NEGATIVE

## 2020-11-09 ENCOUNTER — Other Ambulatory Visit: Payer: Self-pay

## 2020-11-09 ENCOUNTER — Ambulatory Visit
Admission: RE | Admit: 2020-11-09 | Discharge: 2020-11-09 | Disposition: A | Discharge status: Home / Self Care | Payer: Medicaid Other | Source: Ambulatory Visit | Attending: Family Medicine | Admitting: Family Medicine

## 2020-11-09 ENCOUNTER — Encounter: Payer: Self-pay | Admitting: Family Medicine

## 2020-11-09 ENCOUNTER — Ambulatory Visit
Admission: RE | Admit: 2020-11-09 | Discharge: 2020-11-09 | Disposition: A | Discharge status: Home / Self Care | Payer: Medicaid Other | Attending: Family Medicine | Admitting: Family Medicine

## 2020-11-09 ENCOUNTER — Ambulatory Visit (INDEPENDENT_AMBULATORY_CARE_PROVIDER_SITE_OTHER): Payer: Medicaid Other | Admitting: Family Medicine

## 2020-11-09 VITALS — BP 134/93 | HR 62 | Temp 97.7°F | Wt 176.0 lb

## 2020-11-09 DIAGNOSIS — M25551 Pain in right hip: Secondary | ICD-10-CM

## 2020-11-09 DIAGNOSIS — M25559 Pain in unspecified hip: Secondary | ICD-10-CM

## 2020-11-09 NOTE — Progress Notes (Signed)
Established patient visit   Patient: Kristen Chung   DOB: 11-17-1985   35 y.o. Female  MRN: [id number] Visit Date: 11/09/2020  Today's healthcare provider: Laurita Quint Lloyde [place], FNP   Chief Complaint  Patient presents with  . Hip Pain    Right hip pain. Started about six months ago.    Subjective    Hip Pain  There was no injury mechanism (Chronic pain started about 6 months ago. ). The pain is present in the right hip. The quality of the pain is described as cramping and burning. The pain has been worsening since onset. Associated symptoms include an inability to bear weight, a loss of motion, muscle weakness and tingling. Pertinent negatives include no loss of sensation or numbness. The symptoms are aggravated by movement and weight bearing. She has tried NSAIDs, acetaminophen and heat for the symptoms. The treatment provided mild relief.   HPI    Hip Pain     Additional comments: Right hip pain. Started about six months ago.        Last edited by Kizzie Furnish, CMA on 11/09/2020  3:03 PM. (History)      Pain started a year ago 6 months ago Sept had IUD removed, was worried this was the cause Still continues to have the pain Doesn't seem to be getting better Does fine when sitting When changes position has increased pain The right side feels tight and is unable to stand up completely at first Rarely has shooting pain Most of the time feels like burning pain [place] most days of the week Is sitting most of the day, prior was working as a Educational psychologist standing a lot Doesn't feel like sitting or standing makes it worse Denies falls, instability, sounds with movement or feeling like it is out of socket  Patient Active Problem List   Diagnosis Date Noted  . No-show for appointment 04/26/2020  . Irritable bowel syndrome with diarrhea 02/26/2020  . Abdominal bloating 02/26/2020  . Gastroesophageal reflux disease 02/26/2020  . Marijuana abuse 02/26/2020  . Weight gain 02/25/2020   . Fatigue 02/25/2020  . Seasonal and perennial allergic rhinoconjunctivitis 02/10/2020  . Essential hypertension 12/07/2019  . Bilateral swelling of feet 11/17/2019  . Cough 11/17/2019  . Wheezing 11/09/2019  . Pruritus 11/09/2019  . Other allergic rhinitis 11/09/2019  . Neck pain 10/30/2019  . Vertigo 10/30/2019  . Allergic reaction 10/12/2019  . History of Crohn's disease 10/12/2019  . Vaginal bleeding 10/12/2019  . History of abortion- 05/18/2020  10/12/2019  . History of cocaine use 10/12/2019  . Marijuana smoker 10/12/2019  . BPPV (benign paroxysmal positional vertigo), unspecified laterality -reported history  10/12/2019  . Chronic tension-type headache, intractable 10/12/2019  . History of genital warts 10/12/2019  . Adverse food reaction 10/12/2019  . Food allergic contact dermatitis 10/12/2019  . Swelling of both lips 10/12/2019  . Alcohol use 10/12/2019  . History of suicide attempt 10/12/2019  . Suicidal thoughts 10/12/2019  . Shortness of breath 08/28/2018  . Sensorineural hearing loss (SNHL) of both ears 05/05/2018  . Tinnitus of right ear 05/05/2018  . Breast pain in female 05/13/2013  . Crohn disease ([place]) 05/13/2013  . Cocaine substance abuse ([place]) 05/13/2013  . Tobacco abuse counseling 05/13/2013  . Difficulty swallowing 05/13/2013  . 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 Allergy   . Anemia   . Anxiety   . BPPV (benign paroxysmal positional  vertigo)   . Chlamydia   . Crohn disease ([place])   . Depression    No specific treatment  . Eczema   . Esophagitis   . Gastritis   . Genital warts   . Gonorrhea   . Hearing loss   . Heart murmur   . Hiatal hernia   . Kidney stone   . PID (acute pelvic inflammatory disease)   . Pregnancy induced hypertension   . Preterm labor   . Substance abuse ([place])   . Urinary tract infection   . Urticaria    Social History   Tobacco Use  . Smoking status:  Current Some Day Smoker    Packs/day: 0.25    Years: 6.00    Pack years: 1.50    Types: Cigarettes  . Smokeless tobacco: Never Used  Vaping Use  . Vaping Use: Never used  Substance Use Topics  . Alcohol use: Not Currently    Alcohol/week: 9.0 - 16.0 standard drinks    Types: 4 - 6 Cans of beer, 5 - 10 Shots of liquor per week  . Drug use: Not Currently    Types: Marijuana, Cocaine   Allergies  Allergen Reactions  . Celery Oil Itching  . Codone [Hydrocodone] Itching  . Dilaudid [Hydromorphone Hcl] Itching  . Hydromorphone Hcl Itching  . Other     Cats  . Strawberry Extract Itching  . Tramadol Itching     Medications: Outpatient Medications Prior to Visit  Medication Sig  . ARIPiprazole (ABILIFY) 5 MG tablet Take 5 mg by mouth every morning.  . cetirizine (ZYRTEC ALLERGY) 10 MG tablet Take 1 tablet (10 mg total) by mouth daily.  . diphenhydrAMINE (BENADRYL) 25 mg capsule Take 1 capsule (25 mg total) by mouth every 6 (six) hours as needed.  Marland Kitchen EPINEPHrine 0.3 mg/0.3 mL IJ SOAJ injection Inject 0.3 mLs (0.3 mg total) into the muscle as needed for anaphylaxis.  . fluticasone (FLONASE) 50 MCG/ACT nasal spray Place 1-2 sprays into both nostrils daily.  Marland Kitchen NIFEdipine (ADALAT CC) 30 MG 24 hr tablet Take 1 tablet (30 mg total) by mouth in the morning and at bedtime.  Marland Kitchen esomeprazole (NEXIUM) 20 MG capsule Take 1 capsule (20 mg total) by mouth daily at 12 noon. (Patient not taking: No sig reported)  . Lactic Ac-Citric Ac-Pot Bitart (PHEXXI) 1.8-1-0.4 % GEL Place 1 Applicatorful vaginally once as needed for up to 1 dose (at least 5 minutes prior to intercourse, no greater than one (1) hour).  . methocarbamol (ROBAXIN) 500 MG tablet Take 1 tablet (500 mg total) by mouth 2 (two) times daily. (Patient not taking: Reported on 11/09/2020)  . metroNIDAZOLE (FLAGYL) 500 MG tablet Take 1 tablet (500 mg total) by mouth 2 (two) times daily. (Patient not taking: Reported on 11/09/2020)   No  facility-administered medications prior to visit.    Review of Systems  Constitutional: Negative.   Respiratory: Negative.   Musculoskeletal: Positive for arthralgias. Negative for back pain, gait problem, joint swelling, myalgias, neck pain and neck stiffness.  Neurological: Positive for dizziness (History of vertigo) and tingling. Negative for light-headedness, numbness and headaches.        Objective    BP (!) 134/93 (BP Location: Right Arm, Patient Position: Sitting, Cuff Size: Large)   Pulse 62   Temp 97.7 F (36.5 C) (Oral)   Wt 176 lb (79.8 kg)   BMI 31.18 kg/m    Physical Exam Constitutional:      General: She is not  in acute distress.    Appearance: Normal appearance. She is not ill-appearing.  HENT:     Head: Normocephalic.  Cardiovascular:     Rate and Rhythm: Normal rate and regular rhythm.     Pulses: Normal pulses.     Heart sounds: Normal heart sounds. No murmur heard. No friction rub. No gallop.   Pulmonary:     Effort: Pulmonary effort is normal. No respiratory distress.     Breath sounds: Normal breath sounds. No stridor. No wheezing, rhonchi or rales.  Abdominal:     General: Bowel sounds are normal.     Palpations: Abdomen is soft.     Tenderness: There is no abdominal tenderness.  Musculoskeletal:     Right hip: No deformity, lacerations, tenderness, bony tenderness or crepitus. Decreased range of motion (pain with abduction and lateral rotation). Normal strength.     Left hip: Normal.     Right lower leg: No edema.     Left lower leg: No edema.  Skin:    General: Skin is warm and dry.  Neurological:     Mental Status: She is alert and oriented to person, place, and time.  Psychiatric:        Mood and Affect: Mood normal.        Behavior: Behavior normal.       No results found for any visits on 11/09/20.  Assessment & Plan     Problem List Items Addressed This Visit   None   Visit Diagnoses    Right hip pain    -  Primary    Relevant Orders   XR HIPS BILAT W OR W/O PELVIS MIN 5 VIEWS   Ambulatory referral to Physical Therapy     Plan  If no improvement will consider referral to ortho  Will follow up with xray results  Return in about 6 months (around 05/11/2021).       [place], [place] [phone] (phone) [phone] (fax)  [place]

## 2020-11-09 NOTE — Addendum Note (Signed)
Addended by: Randal Buba on: 11/09/2020 04:39 PM   Modules accepted: Orders

## 2020-11-09 NOTE — Patient Instructions (Signed)

## 2020-11-21 ENCOUNTER — Ambulatory Visit: Payer: Medicaid Other | Admitting: Physical Therapy

## 2020-11-28 ENCOUNTER — Ambulatory Visit: Payer: Medicaid Other | Admitting: Physical Therapy

## 2020-11-28 ENCOUNTER — Ambulatory Visit: Payer: Medicaid Other | Attending: Family Medicine | Admitting: Physical Therapy

## 2020-12-05 ENCOUNTER — Encounter: Payer: Medicaid Other | Admitting: Physical Therapy

## 2020-12-07 ENCOUNTER — Encounter: Payer: Medicaid Other | Admitting: Physical Therapy

## 2020-12-12 ENCOUNTER — Encounter: Payer: Medicaid Other | Admitting: Physical Therapy

## 2020-12-14 ENCOUNTER — Encounter: Payer: Medicaid Other | Admitting: Physical Therapy

## 2020-12-20 ENCOUNTER — Encounter: Payer: Medicaid Other | Admitting: Physical Therapy

## 2020-12-22 ENCOUNTER — Encounter: Payer: Medicaid Other | Admitting: Physical Therapy

## 2020-12-26 ENCOUNTER — Encounter: Payer: Medicaid Other | Admitting: Physical Therapy

## 2020-12-28 ENCOUNTER — Encounter: Payer: Medicaid Other | Admitting: Physical Therapy

## 2021-04-26 ENCOUNTER — Ambulatory Visit: Payer: Medicaid Other | Admitting: Family Medicine

## 2021-04-26 NOTE — Progress Notes (Deleted)
      Established patient visit   Patient: Kristen Chung   DOB: [birth year]   35 y.o. Female  MRN: [id number] Visit Date: 04/26/2021  Today's healthcare provider: Wilhemena Durie, MD   No chief complaint on file.  Subjective    HPI  ***  {Link to patient history deactivated due to formatting error:1}  Medications: Outpatient Medications Prior to Visit  Medication Sig   ARIPiprazole (ABILIFY) 5 MG tablet Take 5 mg by mouth every morning.   cetirizine (ZYRTEC ALLERGY) 10 MG tablet Take 1 tablet (10 mg total) by mouth daily.   diphenhydrAMINE (BENADRYL) 25 mg capsule Take 1 capsule (25 mg total) by mouth every 6 (six) hours as needed.   EPINEPHrine 0.3 mg/0.3 mL IJ SOAJ injection Inject 0.3 mLs (0.3 mg total) into the muscle as needed for anaphylaxis.   fluticasone (FLONASE) 50 MCG/ACT nasal spray Place 1-2 sprays into both nostrils daily.   Lactic Ac-Citric Ac-Pot Bitart (PHEXXI) 1.8-1-0.4 % GEL Place 1 Applicatorful vaginally once as needed for up to 1 dose (at least 5 minutes prior to intercourse, no greater than one (1) hour).   NIFEdipine (ADALAT CC) 30 MG 24 hr tablet Take 1 tablet (30 mg total) by mouth in the morning and at bedtime.   No facility-administered medications prior to visit.    Review of Systems  Constitutional:  Negative for appetite change, chills, fatigue and fever.  Respiratory:  Negative for chest tightness and shortness of breath.   Cardiovascular:  Negative for chest pain and palpitations.  Gastrointestinal:  Negative for abdominal pain, nausea and vomiting.  Neurological:  Negative for dizziness and weakness.   {Labs  Heme  Chem  Endocrine  Serology  Results Review (optional):23779}   Objective    There were no vitals taken for this visit. {Show previous vital signs (optional):23777}  Physical Exam  ***  No results found for any visits on 04/26/21.  Assessment & Plan     ***  No follow-ups on file.      {provider  attestation***:1}   Wilhemena Durie, MD  [facility] [phone] (phone) [phone] (fax)  [place]

## 2021-04-28 ENCOUNTER — Encounter: Payer: Medicaid Other | Admitting: Obstetrics and Gynecology

## 2021-04-28 ENCOUNTER — Encounter: Payer: Self-pay | Admitting: Obstetrics and Gynecology

## 2021-05-02 ENCOUNTER — Ambulatory Visit: Payer: Medicaid Other | Admitting: Family Medicine

## 2021-05-11 ENCOUNTER — Ambulatory Visit: Payer: Medicaid Other | Admitting: Family Medicine

## 2021-09-14 ENCOUNTER — Encounter: Payer: Medicaid Other | Admitting: Certified Nurse Midwife

## 2021-09-18 ENCOUNTER — Ambulatory Visit: Payer: Medicaid Other | Admitting: Adult Health

## 2021-12-28 ENCOUNTER — Encounter: Payer: Medicaid Other | Admitting: Obstetrics

## 2022-01-09 ENCOUNTER — Other Ambulatory Visit: Payer: Self-pay

## 2022-01-09 ENCOUNTER — Emergency Department (HOSPITAL_BASED_OUTPATIENT_CLINIC_OR_DEPARTMENT_OTHER)
Admission: EM | Admit: 2022-01-09 | Discharge: 2022-01-09 | Disposition: A | Discharge status: Home / Self Care | Payer: Medicaid Other | Attending: Emergency Medicine | Admitting: Emergency Medicine

## 2022-01-09 ENCOUNTER — Encounter (HOSPITAL_BASED_OUTPATIENT_CLINIC_OR_DEPARTMENT_OTHER): Payer: Self-pay | Admitting: Urology

## 2022-01-09 DIAGNOSIS — Z202 Contact with and (suspected) exposure to infections with a predominantly sexual mode of transmission: Secondary | ICD-10-CM | POA: Insufficient documentation

## 2022-01-09 DIAGNOSIS — N898 Other specified noninflammatory disorders of vagina: Secondary | ICD-10-CM | POA: Diagnosis present

## 2022-01-09 DIAGNOSIS — N760 Acute vaginitis: Secondary | ICD-10-CM | POA: Diagnosis not present

## 2022-01-09 LAB — URINALYSIS, ROUTINE W REFLEX MICROSCOPIC
Bilirubin Urine: NEGATIVE
Glucose, UA: NEGATIVE mg/dL
Hgb urine dipstick: NEGATIVE
Ketones, ur: NEGATIVE mg/dL
Leukocytes,Ua: NEGATIVE
Nitrite: NEGATIVE
Protein, ur: NEGATIVE mg/dL
Specific Gravity, Urine: 1.025 (ref 1.005–1.030)
pH: 6.5 (ref 5.0–8.0)

## 2022-01-09 LAB — WET PREP, GENITAL
Sperm: NONE SEEN
Trich, Wet Prep: NONE SEEN
WBC, Wet Prep HPF POC: <10 (ref <10)
Yeast Wet Prep HPF POC: NONE SEEN

## 2022-01-09 LAB — PREGNANCY, URINE: Preg Test, Ur: NEGATIVE

## 2022-01-09 MED ORDER — DOXYCYCLINE HYCLATE 100 MG PO TABS
100.0000 mg | ORAL_TABLET | Freq: Once | ORAL | Status: AC
  Administered 2022-01-09: 100 mg via ORAL
  Filled 2022-01-09: qty 1

## 2022-01-09 MED ORDER — LIDOCAINE HCL (PF) 1 % IJ SOLN
1.0000 mL | Freq: Once | INTRAMUSCULAR | Status: AC
  Administered 2022-01-09: 2 mL
  Filled 2022-01-09: qty 5

## 2022-01-09 MED ORDER — CEFTRIAXONE SODIUM 500 MG IJ SOLR
500.0000 mg | Freq: Once | INTRAMUSCULAR | Status: AC
  Administered 2022-01-09: 500 mg via INTRAMUSCULAR
  Filled 2022-01-09: qty 500

## 2022-01-09 MED ORDER — METRONIDAZOLE 500 MG PO TABS: 500.0000 mg | ORAL_TABLET | Freq: Two times a day (BID) | ORAL | 0 refills | Status: DC

## 2022-01-09 MED ORDER — METRONIDAZOLE 500 MG PO TABS
500.0000 mg | ORAL_TABLET | Freq: Once | ORAL | Status: AC
  Administered 2022-01-09: 500 mg via ORAL
  Filled 2022-01-09: qty 1

## 2022-01-09 NOTE — ED Notes (Signed)
Pt signed in as a patient while visiting significant other in room 3.  Refuses to leave to go to her own room to be examined.  Dr Pearline Cables made aware.

## 2022-01-09 NOTE — ED Notes (Signed)
Explained to patient that it is a HIPPA violation to have two unrelated patients being cared for in the same room. Pt refuses to leave, states it is ok to discuss/treat patient in the room with her significant other. Other patient in the same room also states she agrees to have patient share a room. Refusing to go to her own room for exam.

## 2022-01-09 NOTE — ED Notes (Signed)
Reviewed discharge instructions, recommendations and follow up with pt. States understanding. Ambulatory and discharged at this time

## 2022-01-09 NOTE — ED Triage Notes (Signed)
Wants STD check, states "my cootie itches" since yesterday  Denies burning with urination  Last cocaine use Sunday

## 2022-01-09 NOTE — Discharge Instructions (Addendum)
Today you were treated prophylactically for gonorrhea and chlamydia.  If for any reason your results are positive for chlamydia you will need to continue antibiotics orally by mouth for the next 7 to 10 days.  Someone will call you with positive results.  Please follow your MyChart and follow-up with your primary care physician.  You are also tested for syphilis and HIV.  Please follow your MyChart results and follow-up with your primary care physician.  Tested positive for bacterial vaginosis.  Antibiotics Flagyl sent to your pharmacy.

## 2022-01-09 NOTE — ED Provider Notes (Signed)
[place] EMERGENCY DEPARTMENT Provider Note   CSN: [id number] Arrival date & time: 01/09/22  1152     History  Chief Complaint  Patient presents with   Exposure to STD    Kristen Chung is a 36 y.o. female.  Patient is a 36 year old female presenting for STD check.  Patient admits to protected sexual intercourse with a partner who is also having unprotected intercourse with another partner.  Patient admits to vaginal itching and discoloration and discharge.  Denies any pelvic pain, lesions, rashes, odor, urea, increased frequency, or urgency.  Denies any vaginal bleeding.  The history is provided by the patient. No language interpreter was used.  Exposure to STD Pertinent negatives include no chest pain, no abdominal pain and no shortness of breath.       Home Medications Prior to Admission medications   Medication Sig Start Date End Date Taking? Authorizing Provider  metroNIDAZOLE (FLAGYL) 500 MG tablet Take 1 tablet (500 mg total) by mouth 2 (two) times daily. 3/79/02  Yes Campbell Stall P, DO  ARIPiprazole (ABILIFY) 5 MG tablet Take 5 mg by mouth every morning. 02/11/20   [provider]  cetirizine (ZYRTEC ALLERGY) 10 MG tablet Take 1 tablet (10 mg total) by mouth daily. 11/09/19   Garnet Sierras, DO  diphenhydrAMINE (BENADRYL) 25 mg capsule Take 1 capsule (25 mg total) by mouth every 6 (six) hours as needed. 11/09/15   Gloriann Loan, PA-C  EPINEPHrine 0.3 mg/0.3 mL IJ SOAJ injection Inject 0.3 mLs (0.3 mg total) into the muscle as needed for anaphylaxis. 10/09/19   Flinchum, Kelby Aline, FNP  fluticasone (FLONASE) 50 MCG/ACT nasal spray Place 1-2 sprays into both nostrils daily. 11/09/19   Garnet Sierras, DO  Lactic Ac-Citric Ac-Pot Bitart (PHEXXI) 1.8-1-0.4 % GEL Place 1 Applicatorful vaginally once as needed for up to 1 dose (at least 5 minutes prior to intercourse, no greater than one (1) hour). 09/08/20   Lawhorn, Lara Mulch, CNM  NIFEdipine (ADALAT CC) 30 MG 24  hr tablet Take 1 tablet (30 mg total) by mouth in the morning and at bedtime. 09/08/20   Lawhorn, Lara Mulch, CNM      Allergies    Celery oil, Codone [hydrocodone], Dilaudid [hydromorphone hcl], Hydromorphone hcl, Other, Strawberry extract, and Tramadol    Review of Systems   Review of Systems  Constitutional:  Negative for chills and fever.  HENT:  Negative for ear pain and sore throat.   Eyes:  Negative for pain and visual disturbance.  Respiratory:  Negative for cough and shortness of breath.   Cardiovascular:  Negative for chest pain and palpitations.  Gastrointestinal:  Negative for abdominal pain and vomiting.  Genitourinary:  Positive for vaginal discharge. Negative for decreased urine volume, dysuria, hematuria, pelvic pain, urgency, vaginal bleeding and vaginal pain.  Musculoskeletal:  Negative for arthralgias and back pain.  Skin:  Negative for color change and rash.  Neurological:  Negative for seizures and syncope.  All other systems reviewed and are negative.   Physical Exam Updated Vital Signs BP (!) 136/97 (BP Location: Right Arm)   Pulse 75   Temp 98.1 F (36.7 C) (Oral)   Resp 20   Ht 5' 3"  (1.6 m)   Wt 68.9 kg   LMP 12/22/2021 (Approximate)   SpO2 100%   BMI 26.93 kg/m  Physical Exam Vitals and nursing note reviewed.  Constitutional:      General: She is not in acute distress.    Appearance:  She is well-developed.  HENT:     Head: Normocephalic and atraumatic.  Eyes:     Conjunctiva/sclera: Conjunctivae normal.  Cardiovascular:     Rate and Rhythm: Normal rate and regular rhythm.     Heart sounds: No murmur heard. Pulmonary:     Effort: Pulmonary effort is normal. No respiratory distress.     Breath sounds: Normal breath sounds.  Abdominal:     Palpations: Abdomen is soft.     Tenderness: There is abdominal tenderness in the suprapubic area. There is no guarding or rebound.  Musculoskeletal:        General: No swelling.     Cervical back:  Neck supple.  Skin:    General: Skin is warm and dry.     Capillary Refill: Capillary refill takes less than 2 seconds.  Neurological:     Mental Status: She is alert.  Psychiatric:        Mood and Affect: Mood normal.     ED Results / Procedures / Treatments   Labs (all labs ordered are listed, but only abnormal results are displayed) Labs Reviewed  WET PREP, GENITAL - Abnormal; Notable for the following components:      Result Value   Clue Cells Wet Prep HPF POC PRESENT (*)    All other components within normal limits  URINALYSIS, ROUTINE W REFLEX MICROSCOPIC  PREGNANCY, URINE  RPR  HIV ANTIBODY (ROUTINE TESTING W REFLEX)  GC/CHLAMYDIA PROBE AMP ([place]) NOT AT [facility]    EKG None  Radiology No results found.  Procedures Procedures    Medications Ordered in ED Medications  metroNIDAZOLE (FLAGYL) tablet 500 mg (has no administration in time range)  cefTRIAXone (ROCEPHIN) injection 500 mg (500 mg Intramuscular Given 01/09/22 1347)  lidocaine (PF) (XYLOCAINE) 1 % injection 1-2.1 mL (2 mLs Other Given 01/09/22 1348)  doxycycline (VIBRA-TABS) tablet 100 mg (100 mg Oral Given 01/09/22 1347)    ED Course/ Medical Decision Making/ A&P                           Medical Decision Making Amount and/or Complexity of Data Reviewed Labs: ordered.  Risk Prescription drug management.   13:35 PM 36 year old female presenting for STD check.  Patient is alert and oriented x3, no acute distress, afebrile, stable vital signs.  Physical exam demonstrates soft abdomen with minimal tenderness of deep palpation of the suprapubic region.  Patient offered pelvic exam and declined at this time.  Will self swab for STDs.  Requesting prophylactic treatment at this time.  Patient given Rocephin and doxycycline prior to discharge.  Wet mount positive for bacterial vaginosis.  Flagyl given in ED and sent to pharmacy.  No trichomonas.  No yeast.  Patient recommended to follow MyChart for  syphilis, gonorrhea, chlamydia, and HIV results.  Recommended for close follow-up with primary care for anything positive.  UA no UTI.  Patient in no distress and overall condition improved here in the ED. Detailed discussions were had with the patient regarding current findings, and need for close f/u with PCP or on call doctor. The patient has been instructed to return immediately if the symptoms worsen in any way for re-evaluation. Patient verbalized understanding and is in agreement with current care plan. All questions answered prior to discharge.        Final Clinical Impression(s) / ED Diagnoses Final diagnoses:  STD exposure  Bacterial vaginosis    Rx / DC Orders ED Discharge  Orders          Ordered    metroNIDAZOLE (FLAGYL) 500 MG tablet  2 times daily        01/09/22 1400              Lianne Cure, DO 68/85/20 1402

## 2022-01-10 LAB — GC/CHLAMYDIA PROBE AMP (~~LOC~~) NOT AT ARMC
Chlamydia: NEGATIVE
Comment: NEGATIVE
Comment: NORMAL
Neisseria Gonorrhea: NEGATIVE

## 2022-01-10 LAB — RPR: RPR Ser Ql: NONREACTIVE

## 2022-01-10 LAB — HIV ANTIBODY (ROUTINE TESTING W REFLEX): HIV Screen 4th Generation wRfx: NONREACTIVE

## 2022-04-09 ENCOUNTER — Emergency Department
Admission: EM | Admit: 2022-04-09 | Discharge: 2022-04-09 | Disposition: A | Discharge status: Home / Self Care | Payer: Medicaid Other | Attending: Emergency Medicine | Admitting: Emergency Medicine

## 2022-04-09 ENCOUNTER — Other Ambulatory Visit: Payer: Self-pay

## 2022-04-09 ENCOUNTER — Emergency Department: Payer: Medicaid Other

## 2022-04-09 ENCOUNTER — Encounter: Payer: Self-pay | Admitting: Intensive Care

## 2022-04-09 DIAGNOSIS — N939 Abnormal uterine and vaginal bleeding, unspecified: Secondary | ICD-10-CM

## 2022-04-09 DIAGNOSIS — R102 Pelvic and perineal pain: Secondary | ICD-10-CM | POA: Diagnosis not present

## 2022-04-09 LAB — URINALYSIS, ROUTINE W REFLEX MICROSCOPIC
Bilirubin Urine: NEGATIVE
Glucose, UA: NEGATIVE mg/dL
Hgb urine dipstick: NEGATIVE
Ketones, ur: 5 mg/dL — AB
Nitrite: NEGATIVE
Protein, ur: 30 mg/dL — AB
Specific Gravity, Urine: 1.028 (ref 1.005–1.030)
pH: 5 (ref 5.0–8.0)

## 2022-04-09 LAB — COMPREHENSIVE METABOLIC PANEL
ALT: 14 U/L (ref 0–44)
AST: 23 U/L (ref 15–41)
Albumin: 3.8 g/dL (ref 3.5–5.0)
Alkaline Phosphatase: 82 U/L (ref 38–126)
Anion gap: 9 (ref 5–15)
BUN: 12 mg/dL (ref 6–20)
CO2: 22 mmol/L (ref 22–32)
Calcium: 9.2 mg/dL (ref 8.9–10.3)
Chloride: 105 mmol/L (ref 98–111)
Creatinine, Ser: 0.95 mg/dL (ref 0.44–1.00)
GFR, Estimated: >60 mL/min (ref >60)
Glucose, Bld: 211 mg/dL — ABNORMAL HIGH (ref 70–99)
Potassium: 3.8 mmol/L (ref 3.5–5.1)
Sodium: 136 mmol/L (ref 135–145)
Total Bilirubin: 0.9 mg/dL (ref 0.3–1.2)
Total Protein: 7.6 g/dL (ref 6.5–8.1)

## 2022-04-09 LAB — CBC WITH DIFFERENTIAL/PLATELET
Abs Immature Granulocytes: 0.01 10*3/uL (ref 0.00–0.07)
Basophils Absolute: 0.1 10*3/uL (ref 0.0–0.1)
Basophils Relative: 1 %
Eosinophils Absolute: 0.8 10*3/uL — ABNORMAL HIGH (ref 0.0–0.5)
Eosinophils Relative: 16 %
HCT: 42.7 % (ref 36.0–46.0)
Hemoglobin: 13.3 g/dL (ref 12.0–15.0)
Immature Granulocytes: 0 %
Lymphocytes Relative: 35 %
Lymphs Abs: 1.8 10*3/uL (ref 0.7–4.0)
MCH: 27 pg (ref 26.0–34.0)
MCHC: 31.1 g/dL (ref 30.0–36.0)
MCV: 86.6 fL (ref 80.0–100.0)
Monocytes Absolute: 0.4 10*3/uL (ref 0.1–1.0)
Monocytes Relative: 7 %
Neutro Abs: 2.1 10*3/uL (ref 1.7–7.7)
Neutrophils Relative %: 41 %
Platelets: 337 10*3/uL (ref 150–400)
RBC: 4.93 MIL/uL (ref 3.87–5.11)
RDW: 13.5 % (ref 11.5–15.5)
WBC: 5.1 10*3/uL (ref 4.0–10.5)
nRBC: 0 % (ref 0.0–0.2)

## 2022-04-09 LAB — POC URINE PREG, ED: Preg Test, Ur: NEGATIVE

## 2022-04-09 LAB — PREGNANCY, URINE: Preg Test, Ur: NEGATIVE

## 2022-04-09 NOTE — ED Provider Notes (Signed)
[facility] Provider Note  Patient Contact: 4:32 PM (approximate)   History   Vaginal Bleeding   HPI  Kristen Chung is a 36 y.o. female presents to the emergency department with heavy vaginal bleeding and cramping for the past 2 weeks.  Patient denies current abdominal pain or flank pain.  No dysuria or increased urinary frequency.  No nausea or vomiting.  Denies a history of irregular vaginal bleeding in the past.  No chest pain, chest tightness or shortness of breath.      Physical Exam   Triage Vital Signs: ED Triage Vitals  Enc Vitals Group     BP 04/09/22 1548 (!) 137/108     Pulse Rate 04/09/22 1548 96     Resp 04/09/22 1548 16     Temp 04/09/22 1548 98 F (36.7 C)     Temp Source 04/09/22 1548 Oral     SpO2 04/09/22 1548 98 %     Weight 04/09/22 1549 150 lb (68 kg)     Height 04/09/22 1549 5' 3"  (1.6 m)     Head Circumference --      Peak Flow --      Pain Score 04/09/22 1549 6     Pain Loc --      Pain Edu? --      Excl. in [place]? --     Most recent vital signs: Vitals:   04/09/22 1548  BP: (!) 137/108  Pulse: 96  Resp: 16  Temp: 98 F (36.7 C)  SpO2: 98%     General: Alert and in no acute distress. Eyes:  PERRL. EOMI. Head: No acute traumatic findings ENT:      Nose: No congestion/rhinnorhea.      Mouth/Throat: Mucous membranes are moist. Neck: No stridor. No cervical spine tenderness to palpation. Cardiovascular:  Good peripheral perfusion Respiratory: Normal respiratory effort without tachypnea or retractions. Lungs CTAB. Good air entry to the bases with no decreased or absent breath sounds. Gastrointestinal: Bowel sounds 4 quadrants. Soft and nontender to palpation. No guarding or rigidity. No palpable masses. No distention. No CVA tenderness. Musculoskeletal: Full range of motion to all extremities.  Neurologic:  No gross focal neurologic deficits are appreciated.  Skin:   No rash noted Other:   ED Results /  Procedures / Treatments   Labs (all labs ordered are listed, but only abnormal results are displayed) Labs Reviewed  CBC WITH DIFFERENTIAL/PLATELET - Abnormal; Notable for the following components:      Result Value   Eosinophils Absolute 0.8 (*)    All other components within normal limits  COMPREHENSIVE METABOLIC PANEL - Abnormal; Notable for the following components:   Glucose, Bld 211 (*)    All other components within normal limits  URINALYSIS, ROUTINE W REFLEX MICROSCOPIC - Abnormal; Notable for the following components:   Color, Urine YELLOW (*)    APPearance HAZY (*)    Ketones, ur 5 (*)    Protein, ur 30 (*)    Leukocytes,Ua MODERATE (*)    Bacteria, UA FEW (*)    All other components within normal limits  PREGNANCY, URINE  POC URINE PREG, ED        RADIOLOGY  I personally viewed and evaluated these images as part of my medical decision making, as well as reviewing the written report by the radiologist.  ED Provider Interpretation: Pelvic ultrasound unremarkable.   PROCEDURES:  Critical Care performed: No  Procedures   MEDICATIONS ORDERED IN ED:  Medications - No data to display   IMPRESSION / MDM / [place] / ED COURSE  I reviewed the triage vital signs and the nursing notes.                              Assessment and plan: Vaginal bleeding:   36 year old female presents to the emergency department with concern for vaginal bleeding for the past 2 weeks.  Patient was hypertensive at triage but vital signs were otherwise reassuring.  She was alert and nontoxic-appearing.  Urinalysis indicated no hemoglobin.  Patient has had moderate leuks with few bacteria but denies dysuria or increased urinary frequency.  CBC and CMP reassuring.  Urine pregnancy test was negative.  Pelvic ultrasound unremarkable.  I recommended follow-up with OB/GYN for irregular vaginal bleeding.  Tylenol was recommended for discomfort.  All patient questions were  answered.   FINAL CLINICAL IMPRESSION(S) / ED DIAGNOSES   Final diagnoses:  Vaginal bleeding     Rx / DC Orders   ED Discharge Orders     None        Note:  This document was prepared using Dragon voice recognition software and may include unintentional dictation errors.   Vallarie Mare [place], PA-C 04/09/22 1947    Carrie Mew, MD 04/10/22 [phone]

## 2022-04-09 NOTE — ED Triage Notes (Signed)
Patient c/o vaginal bleeding X2 weeks. Denies Birth control use. C/o pressure in vagina and headaches

## 2022-06-08 ENCOUNTER — Emergency Department
Admission: EM | Admit: 2022-06-08 | Discharge: 2022-06-08 | Disposition: A | Discharge status: Home / Self Care | Payer: Medicaid Other | Attending: Emergency Medicine | Admitting: Emergency Medicine

## 2022-06-08 ENCOUNTER — Emergency Department: Payer: Medicaid Other

## 2022-06-08 ENCOUNTER — Other Ambulatory Visit: Payer: Self-pay

## 2022-06-08 DIAGNOSIS — Y9301 Activity, walking, marching and hiking: Secondary | ICD-10-CM | POA: Diagnosis not present

## 2022-06-08 DIAGNOSIS — X58XXXA Exposure to other specified factors, initial encounter: Secondary | ICD-10-CM | POA: Diagnosis not present

## 2022-06-08 DIAGNOSIS — S8992XA Unspecified injury of left lower leg, initial encounter: Secondary | ICD-10-CM | POA: Insufficient documentation

## 2022-06-08 MED ORDER — ACETAMINOPHEN 500 MG PO TABS
1000.0000 mg | ORAL_TABLET | Freq: Once | ORAL | Status: AC
  Administered 2022-06-08: 1000 mg via ORAL
  Filled 2022-06-08: qty 2

## 2022-06-08 MED ORDER — IBUPROFEN 600 MG PO TABS
600.0000 mg | ORAL_TABLET | Freq: Once | ORAL | Status: AC
Start: 2022-06-08 — End: 2022-06-08
  Administered 2022-06-08: 600 mg via ORAL
  Filled 2022-06-08: qty 1

## 2022-06-08 NOTE — ED Triage Notes (Signed)
Pt presents via POV c/o leg leg pain. Reports felt a pop in left leg while walking up a hill. Reports increased swelling since. Reports symptoms started on Wednesday. Denies recent travel/surgery.

## 2022-06-08 NOTE — ED Provider Notes (Signed)
[facility] Provider Note    Event Date/Time   First MD Initiated Contact with Patient 06/08/22 1109     (approximate)   History   Leg Pain   HPI  Kristen Chung is a 36 y.o. female   Past medical history of disease, depression anxiety who presents to the emergency department with a leg injury sustained 2 days ago while walking she felt a pop in the affected leg.  Since then she has had pain.  No other injuries no falls or head strike.  History was obtained via patient      Physical Exam   Triage Vital Signs: ED Triage Vitals  Enc Vitals Group     BP 06/08/22 1103 (!) 157/103     Pulse Rate 06/08/22 1103 81     Resp 06/08/22 1103 17     Temp 06/08/22 1103 98.4 F (36.9 C)     Temp src --      SpO2 06/08/22 1103 98 %     Weight 06/08/22 1045 152 lb (68.9 kg)     Height 06/08/22 1045 5' 3"  (1.6 m)     Head Circumference --      Peak Flow --      Pain Score --      Pain Loc --      Pain Edu? --      Excl. in [place]? --     Most recent vital signs: Vitals:   06/08/22 1103  BP: (!) 157/103  Pulse: 81  Resp: 17  Temp: 98.4 F (36.9 C)  SpO2: 98%    General: Awake, no distress.  CV:  Good peripheral perfusion.  Resp:  Normal effort.  Abd:  No distention.  Other:  Swelling and tenderness to the left knee, pain with range of motion, does not appear infected, neurovascular intact.  No other injuries noted.   ED Results / Procedures / Treatments   Labs (all labs ordered are listed, but only abnormal results are displayed) Labs Reviewed - No data to display   RADIOLOGY I independently reviewed and interpreted x-ray of the left knee and see no fracture or dislocation.   PROCEDURES:  Critical Care performed: No  Procedures   MEDICATIONS ORDERED IN ED: Medications - No data to display  IMPRESSION / MDM / [place] / ED COURSE  I reviewed the triage vital signs and the nursing notes.                               Differential diagnosis includes, but is not limited to, dislocation, musculoskeletal injuries, ligamentous injury, given traumatic nature doubt DVT, septic joint, crystal arthropathy.    MDM: Traumatic injury to the leg assess with x-ray, if negative will give immobilization as needed, RICE therapy anticipatory guidance and referral to PMD/Ortho if pain not improved with conservative treatment.  Negative x-rays, patient to be discharged with knee immobilizer and crutches and follow-up with Dr. Sabra Heck in orthopedics for further evaluation or MRI as needed.   Patient's presentation is most consistent with acute presentation with potential threat to life or bodily function.       FINAL CLINICAL IMPRESSION(S) / ED DIAGNOSES   Final diagnoses:  Left knee injury, initial encounter     Rx / DC Orders   ED Discharge Orders     None        Note:  This document was prepared  using Systems analyst and may include unintentional dictation errors.    Lucillie Garfinkel, MD 06/08/22 1120

## 2022-06-08 NOTE — Discharge Instructions (Addendum)
Keep your knee in the knee immobilizer until you are able to see Dr. Sabra Heck in orthopedics clinic.  Use crutches to help you walk.  You may have sprained your knee or torn one of the ligaments inside of your knee, continue to rest and take pain medications for the next several days and talk to Dr. Sabra Heck from orthopedics about the need for further imaging like MRI to see if you tore any of the ligaments.  Take acetaminophen 650 mg and ibuprofen 400 mg every 6 hours for pain.  Take with food. Thank you for choosing [place] for your health care today!  Please see your primary doctor this week for a follow up appointment.   If you do not have a primary doctor call the following clinics to establish care:  If you have insurance:  [facility] [id number] [place] [address]   [facility]  [phone] [address] [place] [address]   If you do not have insurance:  [facility]  [phone] [address], [place] [address]  Sometimes, in the early stages of certain disease courses it is difficult to detect in the emergency department evaluation -- so, it is important that you continue to monitor your symptoms and call your doctor right away or return to the emergency department if you develop any new or worsening symptoms.  It was my pleasure to care for you today.   Hoover Brunette Jacelyn Grip, MD

## 2022-10-21 ENCOUNTER — Emergency Department (HOSPITAL_COMMUNITY): Payer: Medicaid Other

## 2022-10-21 ENCOUNTER — Other Ambulatory Visit: Payer: Self-pay

## 2022-10-21 ENCOUNTER — Emergency Department (HOSPITAL_COMMUNITY)
Admission: EM | Admit: 2022-10-21 | Discharge: 2022-10-21 | Disposition: A | Discharge status: Home / Self Care | Payer: Medicaid Other | Attending: Emergency Medicine | Admitting: Emergency Medicine

## 2022-10-21 DIAGNOSIS — R109 Unspecified abdominal pain: Secondary | ICD-10-CM

## 2022-10-21 DIAGNOSIS — N8003 Adenomyosis of the uterus: Secondary | ICD-10-CM | POA: Insufficient documentation

## 2022-10-21 LAB — URINALYSIS, ROUTINE W REFLEX MICROSCOPIC
Glucose, UA: NEGATIVE mg/dL
Hgb urine dipstick: NEGATIVE
Ketones, ur: 5 mg/dL — AB
Nitrite: NEGATIVE
Protein, ur: 30 mg/dL — AB
Specific Gravity, Urine: 1.032 — ABNORMAL HIGH (ref 1.005–1.030)
pH: 6 (ref 5.0–8.0)

## 2022-10-21 LAB — CBC WITH DIFFERENTIAL/PLATELET
Abs Immature Granulocytes: 0.01 10*3/uL (ref 0.00–0.07)
Basophils Absolute: 0 10*3/uL (ref 0.0–0.1)
Basophils Relative: 1 %
Eosinophils Absolute: 0.2 10*3/uL (ref 0.0–0.5)
Eosinophils Relative: 5 %
HCT: 34.5 % — ABNORMAL LOW (ref 36.0–46.0)
Hemoglobin: 11.3 g/dL — ABNORMAL LOW (ref 12.0–15.0)
Immature Granulocytes: 0 %
Lymphocytes Relative: 55 %
Lymphs Abs: 2.6 10*3/uL (ref 0.7–4.0)
MCH: 27.8 pg (ref 26.0–34.0)
MCHC: 32.8 g/dL (ref 30.0–36.0)
MCV: 85 fL (ref 80.0–100.0)
Monocytes Absolute: 0.5 10*3/uL (ref 0.1–1.0)
Monocytes Relative: 9 %
Neutro Abs: 1.4 10*3/uL — ABNORMAL LOW (ref 1.7–7.7)
Neutrophils Relative %: 30 %
Platelets: 253 10*3/uL (ref 150–400)
RBC: 4.06 MIL/uL (ref 3.87–5.11)
RDW: 13.7 % (ref 11.5–15.5)
WBC: 4.8 10*3/uL (ref 4.0–10.5)
nRBC: 0 % (ref 0.0–0.2)

## 2022-10-21 LAB — I-STAT BETA HCG BLOOD, ED (MC, WL, AP ONLY): I-stat hCG, quantitative: <5 m[IU]/mL (ref <5)

## 2022-10-21 LAB — COMPREHENSIVE METABOLIC PANEL
ALT: 18 U/L (ref 0–44)
AST: 24 U/L (ref 15–41)
Albumin: 3.6 g/dL (ref 3.5–5.0)
Alkaline Phosphatase: 53 U/L (ref 38–126)
Anion gap: 12 (ref 5–15)
BUN: 12 mg/dL (ref 6–20)
CO2: 21 mmol/L — ABNORMAL LOW (ref 22–32)
Calcium: 8.9 mg/dL (ref 8.9–10.3)
Chloride: 104 mmol/L (ref 98–111)
Creatinine, Ser: 0.98 mg/dL (ref 0.44–1.00)
GFR, Estimated: >60 mL/min (ref >60)
Glucose, Bld: 131 mg/dL — ABNORMAL HIGH (ref 70–99)
Potassium: 3.6 mmol/L (ref 3.5–5.1)
Sodium: 137 mmol/L (ref 135–145)
Total Bilirubin: 0.5 mg/dL (ref 0.3–1.2)
Total Protein: 6.5 g/dL (ref 6.5–8.1)

## 2022-10-21 LAB — LIPASE, BLOOD: Lipase: 39 U/L (ref 11–51)

## 2022-10-21 MED ORDER — KETOROLAC TROMETHAMINE 30 MG/ML IJ SOLN
30.0000 mg | Freq: Once | INTRAMUSCULAR | Status: AC
  Administered 2022-10-21: 30 mg via INTRAVENOUS
  Filled 2022-10-21: qty 1

## 2022-10-21 MED ORDER — METHOCARBAMOL 500 MG PO TABS: 500.0000 mg | ORAL_TABLET | Freq: Two times a day (BID) | ORAL | 0 refills | Status: DC

## 2022-10-21 MED ORDER — IBUPROFEN 800 MG PO TABS
800.0000 mg | ORAL_TABLET | Freq: Once | ORAL | Status: AC
  Administered 2022-10-21: 800 mg via ORAL
  Filled 2022-10-21: qty 1

## 2022-10-21 MED ORDER — IBUPROFEN 800 MG PO TABS: 800.0000 mg | ORAL_TABLET | Freq: Three times a day (TID) | ORAL | 0 refills | Status: DC | PRN

## 2022-10-21 MED ORDER — ONDANSETRON HCL 4 MG/2ML IJ SOLN
4.0000 mg | Freq: Once | INTRAMUSCULAR | Status: AC
  Administered 2022-10-21: 4 mg via INTRAVENOUS
  Filled 2022-10-21: qty 2

## 2022-10-21 NOTE — ED Provider Notes (Signed)
Emergency Department Provider Note   I have reviewed the triage vital signs and the nursing notes.   HISTORY  Chief Complaint Flank Pain   HPI Kristen Chung is a 37 y.o. female with past medical history reviewed below presents to the emergency department with right flank pain.  Symptoms began intermittently 2 to 3 days prior.  Pain is mainly in the right flank, sharp, shooting, occasional to the lower abdomen.  No fevers or chills.  No dysuria, hesitancy, urgency.  No chest pain.  No vomiting.  Notes a remote history of kidney stones but unsure if this feels similar or not.  Past Medical History:  Diagnosis Date   Allergy    Anemia    Anxiety    BPPV (benign paroxysmal positional vertigo)    Chlamydia    Crohn disease (HCC)    Depression    No specific treatment   Eczema    Esophagitis    Gastritis    Genital warts    Gonorrhea    Hearing loss    Heart murmur    Hiatal hernia    Kidney stone    PID (acute pelvic inflammatory disease)    Pregnancy induced hypertension    Preterm labor    Substance abuse ()    Urinary tract infection    Urticaria     Review of Systems  Constitutional: No fever/chills Cardiovascular: Denies chest pain. Respiratory: Denies shortness of breath. Gastrointestinal: Positive right flank/abdominal pain.  No nausea, no vomiting.  No diarrhea.  No constipation. Genitourinary: Negative for dysuria. Musculoskeletal: Negative for back pain. Skin: Negative for rash. Neurological: Negative for headaches, focal weakness or numbness.   ____________________________________________   PHYSICAL EXAM:  VITAL SIGNS: ED Triage Vitals [10/21/22 1657]  Enc Vitals Group     BP 138/87     Pulse Rate 88     Resp 20     Temp 97.6 F (36.4 C)     Temp Source Oral     SpO2 97 %   Constitutional: Alert and oriented. Patient with frequent shifting in bed and appears uncomfortable at times.  Eyes: Conjunctivae are normal. Nose: No  congestion/rhinnorhea. Mouth/Throat: Mucous membranes are moist.  Neck: No stridor.   Cardiovascular: Normal rate, regular rhythm. Good peripheral circulation. Grossly normal heart sounds.   Respiratory: Normal respiratory effort.  No retractions. Lungs CTAB. Gastrointestinal: Soft and nontender. No distention. Mild right CVA tenderness.  Musculoskeletal: No gross deformities of extremities. Neurologic:  Normal speech and language. Skin:  Skin is warm, dry and intact. No rash noted.   ____________________________________________   LABS (all labs ordered are listed, but only abnormal results are displayed)  Labs Reviewed  CBC WITH DIFFERENTIAL/PLATELET - Abnormal; Notable for the following components:      Result Value   Hemoglobin 11.3 (*)    HCT 34.5 (*)    Neutro Abs 1.4 (*)    All other components within normal limits  COMPREHENSIVE METABOLIC PANEL - Abnormal; Notable for the following components:   CO2 21 (*)    Glucose, Bld 131 (*)    All other components within normal limits  URINALYSIS, ROUTINE W REFLEX MICROSCOPIC - Abnormal; Notable for the following components:   APPearance HAZY (*)    Specific Gravity, Urine 1.032 (*)    Bilirubin Urine SMALL (*)    Ketones, ur 5 (*)    Protein, ur 30 (*)    Leukocytes,Ua TRACE (*)    Bacteria, UA RARE (*)  All other components within normal limits  LIPASE, BLOOD  I-STAT BETA HCG BLOOD, ED ([facility], [facility], [facility] ONLY)   ____________________________________________   PROCEDURES  Procedure(s) performed:   Procedures  None  ____________________________________________   INITIAL IMPRESSION / ASSESSMENT AND PLAN / ED COURSE  Pertinent labs & imaging results that were available during my care of the patient were reviewed by me and considered in my medical decision making (see chart for details).   This patient is Presenting for Evaluation of abdominal pain, which does require a range of treatment options, and is a complaint that  involves a high risk of morbidity and mortality.  The Differential Diagnoses includes but is not exclusive to ectopic pregnancy, ovarian cyst, ovarian torsion, acute appendicitis, urinary tract infection, endometriosis, bowel obstruction, hernia, colitis, renal colic, gastroenteritis, volvulus etc.   Critical Interventions-    Medications  ketorolac (TORADOL) 30 MG/ML injection 30 mg (30 mg Intravenous Given 10/21/22 1757)  ondansetron (ZOFRAN) injection 4 mg (4 mg Intravenous Given 10/21/22 1757)  ibuprofen (ADVIL) tablet 800 mg (800 mg Oral Given 10/21/22 2322)    Reassessment after intervention:  Pain slightly improved.     Clinical Laboratory Tests Ordered, included pregnancy negative.  LFTs and bilirubin normal.  Lipase normal.  No leukocytosis.  Radiologic Tests Ordered, included CT renal. I independently interpreted the images and agree with radiology interpretation.   Cardiac Monitor Tracing which shows NSR.    Social Determinants of Health Risk patient is a smoker.   Medical Decision Making: Summary:  Presents to the emergency department with right flank pain.  Intermittently feels uncomfortable and frequent shifting in bed although abdomen is diffusely soft and nontender.  Plan for CT renal stone along with labs and reassess.  Reevaluation with update and discussion with plan for symptom mgmt at home with close PCP follow up. Discussed possible adenomyosis diagnosis and provided contact information for OB/Gyn. Patient to call for a follow up appointment.   Patient's presentation is most consistent with acute presentation with potential threat to life or bodily function.   Disposition: discharge  ____________________________________________  FINAL CLINICAL IMPRESSION(S) / ED DIAGNOSES  Final diagnoses:  Right flank pain  Adenomyosis     NEW OUTPATIENT MEDICATIONS STARTED DURING THIS VISIT:  Discharge Medication List as of 10/21/2022 11:06 PM     START taking  these medications   Details  ibuprofen (ADVIL) 800 MG tablet Take 1 tablet (800 mg total) by mouth every 8 (eight) hours as needed for moderate pain., Starting Sun 10/21/2022, Normal    methocarbamol (ROBAXIN) 500 MG tablet Take 1 tablet (500 mg total) by mouth 2 (two) times daily., Starting Sun 10/21/2022, Normal        Note:  This document was prepared using Dragon voice recognition software and may include unintentional dictation errors.  Nanda Quinton, MD, [facility] Emergency Medicine    Destenie Ingber, [place], MD 10/24/22 [phone]

## 2022-10-21 NOTE — ED Notes (Signed)
Pt care taken, no complaints at this time, is going to ct scan.

## 2022-10-21 NOTE — ED Notes (Signed)
Pt gone to ultrasound

## 2022-10-21 NOTE — ED Triage Notes (Signed)
Patient here for evaluation of right flank pain that started a few days ago. Denies chest pain, denies diarrhea, reports some nausea. Patient is alert, oriented, and in no apparent distress at this time.

## 2022-10-21 NOTE — Discharge Instructions (Signed)
You were seen today with flank pain. Your CT scan looks good. There are some findings on your CT showing a possible diagnosis or adenomyosis. You need to see an OB/Gyn in the coming 1-2 weeks.

## 2022-10-21 NOTE — ED Provider Triage Note (Signed)
Emergency Medicine Provider Triage Evaluation Note  Kristen Chung , a 37 y.o. female  was evaluated in triage.  Pt complains of right flank pain x 2 days.  Patient has associated nausea.  Denies past medical history of UTI, notes history of kidney stones remotely as a child.  No meds tried prior to arrival. Denies chest pain, shortness of breath, diarrhea.  Review of Systems  Positive: Negative:   Physical Exam  BP 138/87 (BP Location: Right Arm)   Pulse 88   Temp 97.6 F (36.4 C) (Oral)   Resp 20   SpO2 97%  Gen:   Awake, no distress   Resp:  Normal effort  MSK:   Moves extremities without difficulty  Other:  No appreciable abdominal tenderness to palpation.  Unable to discern right CVA tenderness to palpation due to patient movement  Medical Decision Making  Medically screening exam initiated at 5:00 PM.  Appropriate orders placed.  Dejah L Frakes was informed that the remainder of the evaluation will be completed by another provider, this initial triage assessment does not replace that evaluation, and the importance of remaining in the ED until their evaluation is complete.  Work-up initiated   Morrie Daywalt A, PA-C 10/21/22 1702

## 2023-06-03 ENCOUNTER — Ambulatory Visit: Payer: Self-pay

## 2023-06-03 NOTE — Telephone Encounter (Signed)
  Chief Complaint: lower abdominal pain Symptoms: vomiting if eating meals  not if smaller portions, moderate sharp pain, back pain, diarrhea Frequency: yesterday Pertinent Negatives: Patient denies fever, urination pain Disposition: [] ED /[x] Urgent Care (no appt availability in office) / [] Appointment(In office/virtual)/ []  [facility]/ [] Home Care/ [] Refused Recommended Disposition /[] [place] [facility]/ []  Follow-up with PCP Additional Notes: to UC -  Reason for Disposition . [1] MILD-MODERATE pain AND [2] constant AND [3] present > 2 hours  Answer Assessment - Initial Assessment Questions 1. LOCATION: "Where does it hurt?"      Navel to bottom  2. RADIATION: "Does the pain shoot anywhere else?" (e.g., chest, back)     Back  3. ONSET: "When did the pain begin?" (e.g., minutes, hours or days ago)      Yesterday  4. SUDDEN: "Gradual or sudden onset?"     gradually 5. PATTERN "Does the pain come and go, or is it constant?"    - If it comes and goes: "How long does it last?" "Do you have pain now?"     (Note: Comes and goes means the pain is intermittent. It goes away completely between bouts.)    - If constant: "Is it getting better, staying the same, or getting worse?"      (Note: Constant means the pain never goes away completely; most serious pain is constant and gets worse.)      constant 6. SEVERITY: "How bad is the pain?"  (e.g., Scale 1-10; mild, moderate, or severe)    - MILD (1-3): Doesn't interfere with normal activities, abdomen soft and not tender to touch.     - MODERATE (4-7): Interferes with normal activities or awakens from sleep, abdomen tender to touch.     - SEVERE (8-10): Excruciating pain, doubled over, unable to do any normal activities.       Moderate cramping, pressure  8. CAUSE: "What do you think is causing the stomach pain?"     ? Chron's flare 10. OTHER SYMPTOMS: "Do you have any other symptoms?" (e.g., back pain, diarrhea, fever, urination  pain, vomiting)       Vomiting x 1 week Cannot eat large quantity  Protocols used: Abdominal Pain - Female-A-AH

## 2023-06-12 ENCOUNTER — Other Ambulatory Visit (HOSPITAL_COMMUNITY)
Admission: RE | Admit: 2023-06-12 | Discharge: 2023-06-12 | Disposition: A | Discharge status: Home / Self Care | Payer: Medicaid Other | Source: Ambulatory Visit | Attending: Licensed Practical Nurse | Admitting: Licensed Practical Nurse

## 2023-06-12 ENCOUNTER — Encounter: Payer: Self-pay | Admitting: Licensed Practical Nurse

## 2023-06-12 ENCOUNTER — Ambulatory Visit: Payer: Medicaid Other | Admitting: Licensed Practical Nurse

## 2023-06-12 VITALS — BP 121/100 | HR 99 | Wt 159.5 lb

## 2023-06-12 DIAGNOSIS — F419 Anxiety disorder, unspecified: Secondary | ICD-10-CM

## 2023-06-12 DIAGNOSIS — Z113 Encounter for screening for infections with a predominantly sexual mode of transmission: Secondary | ICD-10-CM

## 2023-06-12 DIAGNOSIS — Z30430 Encounter for insertion of intrauterine contraceptive device: Secondary | ICD-10-CM | POA: Diagnosis not present

## 2023-06-12 DIAGNOSIS — Z01419 Encounter for gynecological examination (general) (routine) without abnormal findings: Secondary | ICD-10-CM | POA: Insufficient documentation

## 2023-06-12 DIAGNOSIS — I10 Essential (primary) hypertension: Secondary | ICD-10-CM

## 2023-06-12 MED ORDER — LEVONORGESTREL 20 MCG/DAY IU IUD
1.0000 | INTRAUTERINE_SYSTEM | Freq: Once | INTRAUTERINE | Status: AC
  Administered 2023-06-12: 1 via INTRAUTERINE

## 2023-06-12 MED ORDER — NIFEDIPINE ER 30 MG PO TB24: 30.0000 mg | ORAL_TABLET | Freq: Every day | ORAL | 5 refills | Status: DC
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
## 2023-06-13 LAB — CBC
Hematocrit: 39.9 % (ref 34.0–46.6)
Hemoglobin: 13 g/dL (ref 11.1–15.9)
MCH: 28.1 pg (ref 26.6–33.0)
MCHC: 32.6 g/dL (ref 31.5–35.7)
MCV: 86 fL (ref 79–97)
Platelets: 307 10*3/uL (ref 150–450)
RBC: 4.62 x10E6/uL (ref 3.77–5.28)
RDW: 12.5 % (ref 11.7–15.4)
WBC: 5.7 10*3/uL (ref 3.4–10.8)

## 2023-06-13 LAB — COMPREHENSIVE METABOLIC PANEL
ALT: 10 [IU]/L (ref 0–32)
AST: 16 [IU]/L (ref 0–40)
Albumin: 4.3 g/dL (ref 3.9–4.9)
Alkaline Phosphatase: 60 [IU]/L (ref 44–121)
BUN/Creatinine Ratio: 13 (ref 9–23)
BUN: 10 mg/dL (ref 6–20)
Bilirubin Total: 0.3 mg/dL (ref 0.0–1.2)
CO2: 19 mmol/L — ABNORMAL LOW (ref 20–29)
Calcium: 10 mg/dL (ref 8.7–10.2)
Chloride: 105 mmol/L (ref 96–106)
Creatinine, Ser: 0.79 mg/dL (ref 0.57–1.00)
Globulin, Total: 2.7 g/dL (ref 1.5–4.5)
Glucose: 79 mg/dL (ref 70–99)
Potassium: 4 mmol/L (ref 3.5–5.2)
Sodium: 141 mmol/L (ref 134–144)
Total Protein: 7 g/dL (ref 6.0–8.5)
eGFR: 99 mL/min/{1.73_m2} (ref >59)

## 2023-06-13 LAB — HEP, RPR, HIV PANEL
HIV Screen 4th Generation wRfx: NONREACTIVE
Hepatitis B Surface Ag: NEGATIVE
RPR Ser Ql: NONREACTIVE

## 2023-06-13 LAB — VARICELLA ZOSTER ANTIBODY, IGG: Varicella zoster IgG: REACTIVE

## 2023-06-13 LAB — HEMOGLOBIN A1C
Est. average glucose Bld gHb Est-mCnc: 111 mg/dL
Hgb A1c MFr Bld: 5.5 % (ref 4.8–5.6)

## 2023-06-13 LAB — HEPATITIS C ANTIBODY: Hep C Virus Ab: NONREACTIVE

## 2023-06-13 LAB — RUBELLA SCREEN: Rubella Antibodies, IGG: 1.88 {index} (ref >0.99)

## 2023-06-14 LAB — CERVICOVAGINAL ANCILLARY ONLY
Bacterial Vaginitis (gardnerella): POSITIVE — AB
Candida Glabrata: NEGATIVE
Candida Vaginitis: POSITIVE — AB
Chlamydia: NEGATIVE
Comment: NEGATIVE
Comment: NEGATIVE
Comment: NEGATIVE
Comment: NEGATIVE
Comment: NEGATIVE
Comment: NORMAL
Neisseria Gonorrhea: NEGATIVE
Trichomonas: POSITIVE — AB

## 2023-06-16 ENCOUNTER — Encounter: Payer: Self-pay | Admitting: Licensed Practical Nurse

## 2023-06-16 ENCOUNTER — Other Ambulatory Visit: Payer: Self-pay | Admitting: Licensed Practical Nurse

## 2023-06-16 DIAGNOSIS — B9689 Other specified bacterial agents as the cause of diseases classified elsewhere: Secondary | ICD-10-CM

## 2023-06-16 DIAGNOSIS — B3731 Acute candidiasis of vulva and vagina: Secondary | ICD-10-CM

## 2023-06-16 DIAGNOSIS — A599 Trichomoniasis, unspecified: Secondary | ICD-10-CM

## 2023-06-16 MED ORDER — FLUCONAZOLE 150 MG PO TABS: 150.0000 mg | ORAL_TABLET | ORAL | 0 refills | Status: AC

## 2023-06-16 MED ORDER — METRONIDAZOLE 500 MG PO TABS: 500.0000 mg | ORAL_TABLET | Freq: Two times a day (BID) | ORAL | 0 refills | Status: DC

## 2023-06-16 NOTE — Progress Notes (Signed)
Pt see for annual exam 11/20, swabs shows BV, trich and yeast. TC to pt LVM. Mychart message sent. Scripts sent to pharmacy Carie Caddy, CNM  [facility]  06/16/23  11:12 AM

## 2023-07-08 ENCOUNTER — Telehealth: Payer: Self-pay | Admitting: Family Medicine

## 2023-07-09 ENCOUNTER — Telehealth: Payer: Self-pay | Admitting: Family Medicine

## 2023-07-09 ENCOUNTER — Ambulatory Visit: Payer: Medicaid Other | Admitting: Family Medicine

## 2023-07-09 NOTE — Telephone Encounter (Signed)
Line dropped, tried to cb pt to reschedule appt, no answer.

## 2023-08-22 ENCOUNTER — Ambulatory Visit: Payer: Medicaid Other | Admitting: Physician Assistant

## 2023-08-22 ENCOUNTER — Encounter: Payer: Self-pay | Admitting: Physician Assistant

## 2023-08-22 VITALS — BP 182/131 | HR 101 | Resp 22 | Ht 64.0 in | Wt 166.0 lb

## 2023-08-22 DIAGNOSIS — Z716 Tobacco abuse counseling: Secondary | ICD-10-CM

## 2023-08-22 DIAGNOSIS — F1410 Cocaine abuse, uncomplicated: Secondary | ICD-10-CM | POA: Diagnosis not present

## 2023-08-22 DIAGNOSIS — I10 Essential (primary) hypertension: Secondary | ICD-10-CM | POA: Diagnosis not present

## 2023-08-22 DIAGNOSIS — Z7689 Persons encountering health services in other specified circumstances: Secondary | ICD-10-CM

## 2023-08-22 DIAGNOSIS — F1290 Cannabis use, unspecified, uncomplicated: Secondary | ICD-10-CM

## 2023-08-22 MED ORDER — VALSARTAN 40 MG PO TABS: 40.0000 mg | ORAL_TABLET | Freq: Every day | ORAL | 0 refills | Status: DC

## 2023-08-22 MED ORDER — BLOOD PRESSURE KIT: PACK | 0 refills | Status: DC

## 2023-08-22 MED ORDER — HYDROCHLOROTHIAZIDE 12.5 MG PO TABS: 12.5000 mg | ORAL_TABLET | Freq: Every day | ORAL | 3 refills | Status: DC

## 2023-08-22 MED ORDER — BLOOD PRESSURE KIT: PACK | 0 refills | Status: AC

## 2023-08-22 NOTE — Progress Notes (Signed)
New patient visit  Patient: Kristen Chung   DOB: 10-22-1985   38 y.o. Female  MRN: [id number] Visit Date: 08/22/2023  Today's healthcare provider: Debera Lat, PA-C   Chief Complaint  Patient presents with   Establish Care   Subjective    Kristen Chung is a 38 y.o. female who presents today as a new patient to re-establish care.   Discussed the use of AI scribe software for clinical note transcription with the patient, who gave verbal consent to proceed.  History of Present Illness   The patient, with a history of anxiety and depression, presents to establish care and address concerns about high blood pressure. They report that their blood pressure is consistently high and they have been prescribed nifedipine in the past, but have not been taking it. They occasionally experience leg swelling and rapid heart beating, which they believe may be related to their anxiety. They also report using cocaine and marijuana, with the most recent use being the day of the visit.  The patient also expresses concerns about a possible infidelity in their relationship. They report that their partner has been in contact with an ex-girlfriend, leading to suspicions of infidelity. The patient last had an STD test in November and has not had any other partners since then. They are unsure if their partner has been unfaithful, but express a desire to address the issue and maintain their relationship.      Past Medical History:  Diagnosis Date   Allergy    Anemia    Anxiety    BPPV (benign paroxysmal positional vertigo)    Chlamydia    Crohn disease (HCC)    Depression    No specific treatment   Eczema    Esophagitis    Gastritis    Genital warts    Gonorrhea    Hearing loss    Heart murmur    Hiatal hernia    Kidney stone    PID (acute pelvic inflammatory disease)    Pregnancy induced hypertension    Preterm labor    Substance abuse (HCC)    Urinary tract infection    Urticaria    Past  Surgical History:  Procedure Laterality Date   abortion     x2   Family Status  Relation Name Status   Mother  (Not Specified)   Father  (Not Specified)   Sister  (Not Specified)   Other Grandparents (Not Specified)   Neg Hx  (Not Specified)  No partnership data on file   Family History  Problem Relation Age of Onset   Asthma Mother    Heart disease Mother    Diabetes Mother    Hypertension Mother    Cancer Father        Breast   Asthma Sister    Depression Sister    Diabetes Sister    Hypertension Sister    Breast cancer Sister    Diabetes Other    Anesthesia problems Neg Hx    Social History   Socioeconomic History   Marital status: Single    Spouse name: Not on file   Number of children: 2   Years of education: Not on file   Highest education level: Not on file  Occupational History   Not on file  Tobacco Use   Smoking status: Some Days    Current packs/day: 0.25    Average packs/day: 0.3 packs/day for 6.0 years (1.5 ttl pk-yrs)    Types: Cigarettes  Smokeless tobacco: Never  Vaping Use   Vaping status: Never Used  Substance and Sexual Activity   Alcohol use: Yes    Alcohol/week: 9.0 - 16.0 standard drinks of alcohol    Types: 4 - 6 Cans of beer, 5 - 10 Shots of liquor per week   Drug use: Yes    Types: Marijuana, Cocaine    Comment: daily   Sexual activity: Yes    Birth control/protection: None  Other Topics Concern   Not on file  Social History Narrative   Lives two children. Recently took out 50-B on the FOB.   Social Drivers of Corporate investment banker Strain: Low Risk  (04/22/2019)   Overall Financial Resource Strain (CARDIA)    Difficulty of Paying Living Expenses: Not hard at all  Food Insecurity: Unknown (04/22/2019)   Hunger Vital Sign    Worried About Running Out of Food in the Last Year: Not on file    Ran Out of Food in the Last Year: Never true  Transportation Needs: No Transportation Needs (04/22/2019)   PRAPARE -  Administrator, Civil Service (Medical): No    Lack of Transportation (Non-Medical): No  Physical Activity: Not on file  Stress: Stress Concern Present (04/22/2019)   [employer] of Occupational Health - Occupational Stress Questionnaire    Feeling of Stress : Very much  Social Connections: Unknown (04/22/2019)   Social Connection and Isolation Panel [NHANES]    Frequency of Communication with Friends and Family: Twice a week    Frequency of Social Gatherings with Friends and Family: Once a week    Attends Religious Services: Not on Marketing executive or Organizations: Not on file    Attends Banker Meetings: Not on file    Marital Status: Not on file   Outpatient Medications Prior to Visit  Medication Sig   EPINEPHrine 0.3 mg/0.3 mL IJ SOAJ injection Inject 0.3 mLs (0.3 mg total) into the muscle as needed for anaphylaxis. (Patient not taking: Reported on 08/22/2023)   NIFEdipine (ADALAT CC) 30 MG 24 hr tablet Take 1 tablet (30 mg total) by mouth daily. (Patient not taking: Reported on 08/22/2023)   [DISCONTINUED] diphenhydrAMINE (BENADRYL) 25 mg capsule Take 1 capsule (25 mg total) by mouth every 6 (six) hours as needed. (Patient taking differently: Take 25 mg by mouth every 6 (six) hours as needed for allergies or sleep.)   [DISCONTINUED] ibuprofen (ADVIL) 800 MG tablet Take 1 tablet (800 mg total) by mouth every 8 (eight) hours as needed for moderate pain.   [DISCONTINUED] metroNIDAZOLE (FLAGYL) 500 MG tablet Take 1 tablet (500 mg total) by mouth 2 (two) times daily.   No facility-administered medications prior to visit.   Allergies  Allergen Reactions   Celery Oil Itching   Codone [Hydrocodone] Itching   Dilaudid [Hydromorphone Hcl] Itching   Hydromorphone Hcl Itching   Molds & Smuts    Other     Cats   Strawberry Extract Itching   Tramadol Itching    Immunization History  Administered Date(s) Administered   Tdap 11/19/2019     Health Maintenance  Topic Date Due   Pneumococcal Vaccine 25-62 Years old (1 of 2 - PCV) Never done   INFLUENZA VACCINE  Never done   COVID-19 Vaccine (1 - 2024-25 season) Never done   Cervical Cancer Screening (HPV/Pap Cotest)  09/08/2025   DTaP/Tdap/Td (2 - Td or Tdap) 11/18/2029   Hepatitis C Screening  Completed   HIV Screening  Completed   HPV VACCINES  Aged Out    Patient Care Team: Debera Lat, PA-C as PCP - General (Physician Assistant)  Review of Systems  All other systems reviewed and are negative.  Except see HPI       Objective    BP (!) 182/131   Pulse (!) 101   Resp (!) 22   Ht 5\' 4"  (1.626 m)   Wt 166 lb (75.3 kg)   SpO2 99%   BMI 28.49 kg/m     Physical Exam Vitals reviewed.  Constitutional:      General: She is not in acute distress.    Appearance: Normal appearance. She is well-developed. She is not diaphoretic.  HENT:     Head: Normocephalic and atraumatic.  Eyes:     General: No scleral icterus.    Conjunctiva/sclera: Conjunctivae normal.  Neck:     Thyroid: No thyromegaly.  Cardiovascular:     Rate and Rhythm: Normal rate and regular rhythm.     Pulses: Normal pulses.     Heart sounds: Normal heart sounds. No murmur heard. Pulmonary:     Effort: Pulmonary effort is normal. No respiratory distress.     Breath sounds: Normal breath sounds. No wheezing, rhonchi or rales.  Musculoskeletal:     Cervical back: Neck supple.     Right lower leg: No edema.     Left lower leg: No edema.  Lymphadenopathy:     Cervical: No cervical adenopathy.  Skin:    General: Skin is warm and dry.     Findings: No rash.  Neurological:     Mental Status: She is alert and oriented to person, place, and time. Mental status is at baseline.  Psychiatric:        Mood and Affect: Mood normal.        Behavior: Behavior normal.     Depression Screen    08/22/2023    2:20 PM 09/08/2020    8:57 AM 10/09/2019    2:22 PM 05/13/2013    4:11 PM  PHQ  2/9 Scores  PHQ - 2 Score 0 3 2 0  PHQ- 9 Score 0 15 13    No results found for any visits on 08/22/23.  Assessment & Plan         Hypertension chronic Uncontrolled, with reported non-compliance to Nifedipine. Noted occasional leg swelling. No current chest pain, shortness of breath, or rapid heart beating. -Start Valsartan 40 and a diuretic 12.5(water pill). -Check blood pressure in two weeks. -Attempt to obtain a blood pressure cuff through insurance. If not covered, explore other options with Child psychotherapist and pharmacist. BP devise was sent to local pharmacy  Substance Use Recent use of cocaine, nicotine and marijuana. Cocaine use may be contributing to hypertension. -Encourage reduction or cessation of cocaine use due to risk of stroke and heart attack. -Consider referral to [facility] or emergency room if unable to control substance use.  Follow-up -Return in two weeks to assess blood pressure control and response to new medications. -If symptoms of chest pain, shortness of breath, rapid heart beating, visual disturbances, weakness, tingling, or numbness occur, patient is advised to go to the emergency room.     Encounter to establish care Welcomed to our clinic Reviewed past medical hx, social hx, family hx and surgical hx Pt advised to send all vaccination records or screening   Return in about 2 weeks (around 09/05/2023) for BP f/u.  The patient was advised to call back or seek an in-person evaluation if the symptoms worsen or if the condition fails to improve as anticipated.  I discussed the assessment and treatment plan with the patient. The patient was provided an opportunity to ask questions and all were answered. The patient agreed with the plan and demonstrated an understanding of the instructions.  I, Debera Lat, PA-C have reviewed all documentation for this visit. The documentation on  08/22/2023   for the exam, diagnosis, procedures, and orders are all accurate and  complete.  Debera Lat, [facility], MMS [facility] [phone] (phone) [phone] (fax)  [facility]

## 2023-09-05 ENCOUNTER — Ambulatory Visit: Payer: Self-pay | Admitting: Physician Assistant

## 2023-10-07 ENCOUNTER — Ambulatory Visit: Payer: Self-pay | Admitting: Physician Assistant

## 2023-10-07 ENCOUNTER — Encounter: Payer: Self-pay | Admitting: Physician Assistant

## 2023-10-07 NOTE — Telephone Encounter (Signed)
  Chief Complaint: "seasonal allergies" Symptoms: red, itchy watery eyes, cough, nasal congestion, occasional wheezing Frequency: x 3 days Pertinent Negatives: Patient denies difficulty breathing, chest pain, fever Disposition: [] ED /[] Urgent Care (no appt availability in office) / [] Appointment(In office/virtual)/ []  [facility]/ [x] Home Care/ [] Refused Recommended Disposition /[] [facility]/ []  Follow-up with PCP Additional Notes: Patient c/o "seasonal allergies". She states she was outside last week and thinks her symptoms are from pollen exposure. No audible wheezing noted, no labored breathing or distress noted; patient speaking in full sentences. Patient states she took Benadryl the first day and it did not help. Patient insists her symptoms are seasonal allergies and denies having conjunctivitis. Patient states in the past the allergist had her on Zyrtec and it helped. She is concerned due to being on blood pressure medication, wants to take OTC allergy meds that will not interact. Per [facility] Allegra does not have drug to drug interaction with valsartan or hydrochlorothiazide. Patient states she has taken Allegra before and it helps. She states she will try Allegra. Patient is in [place] for the week and would like to try home care.  Copied from CRM [id number]. Topic: Clinical - Medical Advice >> Oct 07, 2023  1:22 PM Clayton Bibles wrote: Reason for CRM: She is coughing, stopped up, red itchy swollen eyes. She is in [place] (Monday - Friday) and cannot complete an appointment. She would like a nurse to call her to review symptoms and advise her what she can take with her other medications. She wants to try something over the counter.   Her call back number is [phone]. Thanks Reason for Disposition  [1] Nasal allergies AND [2] only certain times of year (hay fever)  Answer Assessment - Initial Assessment Questions 1. SYMPTOM: "What's the main symptom  you're concerned about?" (e.g., runny nose, stuffiness, sneezing, itching)     Nasal congestion (clear mucus), cough.  2. SEVERITY: "How bad is it?" "What does it keep you from doing?" (e.g., sleeping, working)      Patient states the last 2 nights she was not able to sleep. She states she does not work yet.  3. EYES: "Are the eyes also red, watery, and itchy?"      Red eyelids, itchy, puffy and watery with yellow discharge.  4. TRIGGER: "What pollen or other allergic substance do you think is causing the symptoms?"      She states she thinks this is due to being exposed to pollen outside last week.  5. TREATMENT: "What medicine are you using?" "What medicine worked best in the past?"     She states she took Benadryl the first day and it didn't really do anything. She states she took a Tylenol PM all 3 nights to try to help with sleep. She states in the past she has been on Zyrtec and it helped.  6. OTHER SYMPTOMS: "Do you have any other symptoms?" (e.g., coughing, difficulty breathing, wheezing)     Wheezing (when lying or sitting still).   7. PREGNANCY: "Is there any chance you are pregnant?" "When was your last menstrual period?"     Has IUD.  Protocols used: Nasal Allergies (Hay Fever)-A-AH

## 2023-11-14 ENCOUNTER — Telehealth: Payer: Self-pay

## 2023-11-14 ENCOUNTER — Encounter: Payer: Self-pay | Admitting: Physician Assistant

## 2023-11-14 DIAGNOSIS — F1290 Cannabis use, unspecified, uncomplicated: Secondary | ICD-10-CM

## 2023-11-14 DIAGNOSIS — Z716 Tobacco abuse counseling: Secondary | ICD-10-CM

## 2023-11-14 DIAGNOSIS — F1410 Cocaine abuse, uncomplicated: Secondary | ICD-10-CM

## 2023-11-14 NOTE — Telephone Encounter (Signed)
 Copied from CRM [id number]. Topic: Appointments - Appointment Scheduling >> Nov 14, 2023  1:15 PM Emylou G wrote: Patient is looking for therapist.. any recommendations?  Pls call her [phone]

## 2023-11-15 ENCOUNTER — Encounter: Payer: Self-pay | Admitting: Physician Assistant

## 2024-01-13 ENCOUNTER — Telehealth: Payer: Self-pay

## 2024-01-13 NOTE — Telephone Encounter (Signed)
 Copied from CRM [id number]. Topic: Clinical - Red Word Triage >> Jan 13, 2024  9:54 AM Tobias CROME wrote: Red Word that prompted transfer to Nurse Triage: Pt states she has had stomach issues and has passed out 5 times in the last month. Pt declined to speak to NT and is scheduled to see Janna 01/17/24.   FYI

## 2024-01-17 ENCOUNTER — Other Ambulatory Visit (HOSPITAL_COMMUNITY)
Admission: RE | Admit: 2024-01-17 | Discharge: 2024-01-17 | Disposition: A | Discharge status: Home / Self Care | Source: Ambulatory Visit | Attending: Physician Assistant | Admitting: Physician Assistant

## 2024-01-17 ENCOUNTER — Ambulatory Visit: Admitting: Physician Assistant

## 2024-01-17 VITALS — BP 159/101 | HR 72 | Resp 16 | Ht 66.0 in | Wt 168.0 lb

## 2024-01-17 DIAGNOSIS — F32A Depression, unspecified: Secondary | ICD-10-CM

## 2024-01-17 DIAGNOSIS — Z8719 Personal history of other diseases of the digestive system: Secondary | ICD-10-CM

## 2024-01-17 DIAGNOSIS — N941 Unspecified dyspareunia: Secondary | ICD-10-CM | POA: Diagnosis not present

## 2024-01-17 DIAGNOSIS — R109 Unspecified abdominal pain: Secondary | ICD-10-CM

## 2024-01-17 DIAGNOSIS — I10 Essential (primary) hypertension: Secondary | ICD-10-CM

## 2024-01-17 DIAGNOSIS — F1491 Cocaine use, unspecified, in remission: Secondary | ICD-10-CM

## 2024-01-17 DIAGNOSIS — R5383 Other fatigue: Secondary | ICD-10-CM

## 2024-01-17 DIAGNOSIS — J302 Other seasonal allergic rhinitis: Secondary | ICD-10-CM | POA: Diagnosis not present

## 2024-01-17 DIAGNOSIS — Z716 Tobacco abuse counseling: Secondary | ICD-10-CM

## 2024-01-17 DIAGNOSIS — F419 Anxiety disorder, unspecified: Secondary | ICD-10-CM

## 2024-01-17 DIAGNOSIS — G4489 Other headache syndrome: Secondary | ICD-10-CM

## 2024-01-17 MED ORDER — VALSARTAN 40 MG PO TABS: 40.0000 mg | ORAL_TABLET | Freq: Every day | ORAL | 0 refills | Status: DC

## 2024-01-17 MED ORDER — HYDROCHLOROTHIAZIDE 12.5 MG PO TABS: 12.5000 mg | ORAL_TABLET | Freq: Every day | ORAL | 3 refills | Status: DC

## 2024-01-17 MED ORDER — ESCITALOPRAM OXALATE 10 MG PO TABS: 10.0000 mg | ORAL_TABLET | Freq: Every day | ORAL | 1 refills | Status: DC

## 2024-01-17 NOTE — Progress Notes (Unsigned)
 Established patient visit  Patient: Kristen Chung   DOB: 1985/11/03   38 y.o. Female  MRN: [id number] Visit Date: 01/17/2024  Today's healthcare provider: Jolynn Spencer, PA-C   Chief Complaint  Patient presents with  . Hypertension    IBS, HTN   Subjective      Discussed the use of AI scribe software for clinical note transcription with the patient, who gave verbal consent to proceed.  History of Present Illness Kristen Chung is a 38 year old female with hypertension and with hx of Crohn's disease who presents with worsening headaches and blood pressure management issues.  She has difficulty managing blood pressure medications, specifically hydrochlorothiazide  and valsartan , due to prescription refill issues after moving from [place]  to [place] . She has not taken these medications since the move. She experiences chest pain, shortness of breath, and palpitations a couple of times a week.  Severe headaches have worsened over the past year, initially occurring every three to six months and now a few times a month. The headaches are characterized by stabbing and pushing pain in the right eye, often with light sensitivity and tearing. They can last up to a day and are somewhat relieved by lying down in a dark room. Tylenol  provides some relief. Consuming alcohol makes it worse.  She has Crohn's disease diagnosed in 2011 and IBS in 2021 but has not seen a gastroenterologist since then. She experiences stomach pain related to eating and drinking.  She experiences episodes of near syncope, which have occurred three times, and attributes these to anxiety and depression. She has a history of anxiety and depression but is not currently on medication for these conditions.  Denies using any substance/recreational drugs.     01/17/2024   11:42 AM 08/22/2023    2:20 PM 09/08/2020    8:57 AM  Depression screen PHQ 2/9  Decreased Interest 2 0 2  Down, Depressed, Hopeless 2 0 1  PHQ -  2 Score 4 0 3  Altered sleeping 3 0 3  Tired, decreased energy 3 0 2  Change in appetite 2 0 2  Feeling bad or failure about yourself  2 0 1  Trouble concentrating 2 0 3  Moving slowly or fidgety/restless 2 0 0  Suicidal thoughts 2 0 1  PHQ-9 Score 20 0 15  Difficult doing work/chores   Not difficult at all      01/17/2024   11:43 AM 08/22/2023    2:20 PM  GAD 7 : Generalized Anxiety Score  Nervous, Anxious, on Edge 2 0  Control/stop worrying 2 0  Worry too much - different things 3 0  Trouble relaxing 2 0  Restless 2 0  Easily annoyed or irritable 2 0  Afraid - awful might happen 0 0  Total GAD 7 Score 13 0  Anxiety Difficulty Somewhat difficult     Medications: Outpatient Medications Prior to Visit  Medication Sig  . Blood Pressure KIT Measure BP at home daily  . EPINEPHrine  0.3 mg/0.3 mL IJ SOAJ injection Inject 0.3 mLs (0.3 mg total) into the muscle as needed for anaphylaxis. (Patient not taking: Reported on 01/17/2024)  . NIFEdipine  (ADALAT  CC) 30 MG 24 hr tablet Take 1 tablet (30 mg total) by mouth daily. (Patient not taking: Reported on 01/17/2024)  . [DISCONTINUED] hydrochlorothiazide  (HYDRODIURIL ) 12.5 MG tablet Take 1 tablet (12.5 mg total) by mouth daily.  . [DISCONTINUED] valsartan  (DIOVAN ) 40 MG tablet Take 1 tablet (40 mg total) by mouth daily.  No facility-administered medications prior to visit.    Review of Systems All negative Except see HPI   {Insert previous labs (optional):23779} {See past labs  Heme  Chem  Endocrine  Serology  Results Review (optional):1}   Objective    BP (!) 159/101 (BP Location: Left Arm, Patient Position: Sitting)   Pulse 72   Resp 16   Ht 5' 6 (1.676 m)   Wt 168 lb (76.2 kg)   SpO2 100%   BMI 27.12 kg/m  {Insert last BP/Wt (optional):23777}{See vitals history (optional):1}   Physical Exam Vitals reviewed.  Constitutional:      General: She is not in acute distress.    Appearance: Normal appearance. She is  well-developed. She is not diaphoretic.  HENT:     Head: Normocephalic and atraumatic.   Eyes:     General: No scleral icterus.    Conjunctiva/sclera: Conjunctivae normal.   Neck:     Thyroid: No thyromegaly.   Cardiovascular:     Rate and Rhythm: Normal rate and regular rhythm.     Pulses: Normal pulses.     Heart sounds: Normal heart sounds. No murmur heard. Pulmonary:     Effort: Pulmonary effort is normal. No respiratory distress.     Breath sounds: Normal breath sounds. No wheezing, rhonchi or rales.   Musculoskeletal:     Cervical back: Neck supple.     Right lower leg: No edema.     Left lower leg: No edema.  Lymphadenopathy:     Cervical: No cervical adenopathy.   Skin:    General: Skin is warm and dry.     Findings: No rash.   Neurological:     General: No focal deficit present.     Mental Status: She is alert and oriented to person, place, and time. Mental status is at baseline.     Cranial Nerves: No cranial nerve deficit.     Sensory: No sensory deficit.     Motor: No weakness.     Coordination: Coordination normal.     Gait: Gait normal.     Deep Tendon Reflexes: Reflexes normal.   Psychiatric:        Mood and Affect: Mood normal.        Behavior: Behavior normal.        Thought Content: Thought content normal.     No results found for any visits on 01/17/24.      Assessment & Plan Hypertension Chronic Non-compliance with hydrochlorothiazide  and valsartan  due to refill issues. Symptoms suggest uncontrolled blood pressure. - Refill hydrochlorothiazide  and valsartan . - Instruct to start medications urgently. - Refer to cardiology for chest pain and palpitations. - Advise ER visit if symptoms worsen. Advised lifestyle modifications  Headaches Worsening headaches with ocular pain, photophobia, and alcohol as a potential trigger. Urgent neurology referral needed. In the past, was diagnosed with tension headache. Normal neuro exam - Refer to  neurology. - Advise reducing alcohol consumption. - Consider CT scan pending insurance approval.  Hx of Crohn's Disease/Hx of IBS Potential symptom exacerbation. No gastroenterology follow-up since diagnosis in 2011. - Refer to gastroenterology for proper assessment Will follow-up.  Dyspareunia Acute Uterine pain during intercourse possibly related to IUD. OBGYN evaluation needed. - Refer to OBGYN for IUD and cervical pain evaluation. - Perform STD testing.  Anxiety and Depression Chronic Significant anxiety and depression affecting daily life. Not currently on treatment. - Prescribe starting medication for depression and anxiety. - Advise mental health assessment at [facility].  Seasonal  Allergies Experiences nasal congestion and headaches in spring and summer. Uses Claritin . - Continue Claritin , nasal saline spray and flonase  OTC.  General Health Maintenance Advised to improve hydration and dietary habits. - Drink 9-12 glasses of water daily. - Eat regular, balanced meals.  Follow-up Requires close follow-up due to complex conditions. - Schedule follow-up next week. - Advise ER visit if symptoms worsen over the weekend.  Tobacco smoking Current day user .25 ppd  x ~2 years Patient was advised to quit smoking  Will reassess at the next appt   Orders Placed This Encounter  Procedures  . CT HEAD WO CONTRAST ( )    Standing Status:   Future    Expiration Date:   01/16/2025    Is patient pregnant?:   Unknown (Please Explain)    Preferred imaging location?:   [facility]  . CBC w/Diff/Platelet  . Comprehensive metabolic panel with GFR  . Urinalysis, Routine w reflex microscopic  . Lactic acid, plasma  . TSH + free T4  . HIV antibody (with reflex)  . Lipid panel    Has the patient fasted?:   Yes  . Hemoglobin A1c  . Ambulatory referral to Gastroenterology    Referral Priority:   Urgent    Referral Type:   Consultation    Referral Reason:   Specialty Services  Required    Number of Visits Requested:   1  . Ambulatory referral to Neurology    Referral Priority:   Urgent    Referral Type:   Consultation    Referral Reason:   Specialty Services Required    Requested Specialty:   Neurology    Number of Visits Requested:   1  . Ambulatory referral to Cardiology    Referral Priority:   Urgent    Referral Type:   Consultation    Referral Reason:   Specialty Services Required    Number of Visits Requested:   1    Return in about 1 week (around 01/24/2024) for BP f/u.   The patient was advised to call back or seek an in-person evaluation if the symptoms worsen or if the condition fails to improve as anticipated.  I discussed the assessment and treatment plan with the patient. The patient was provided an opportunity to ask questions and all were answered. The patient agreed with the plan and demonstrated an understanding of the instructions.  I, Sherra Kimmons, PA-C have reviewed all documentation for this visit. The documentation on 01/17/2024  for the exam, diagnosis, procedures, and orders are all accurate and complete.  Jolynn Spencer, [facility], MMS [facility] [phone] (phone) [phone] (fax)  [facility]

## 2024-01-18 ENCOUNTER — Encounter: Payer: Self-pay | Admitting: Physician Assistant

## 2024-01-18 DIAGNOSIS — F32A Depression, unspecified: Secondary | ICD-10-CM | POA: Insufficient documentation

## 2024-01-18 DIAGNOSIS — N941 Unspecified dyspareunia: Secondary | ICD-10-CM | POA: Insufficient documentation

## 2024-01-18 DIAGNOSIS — G4489 Other headache syndrome: Secondary | ICD-10-CM | POA: Insufficient documentation

## 2024-01-18 DIAGNOSIS — Z8719 Personal history of other diseases of the digestive system: Secondary | ICD-10-CM | POA: Insufficient documentation

## 2024-01-18 DIAGNOSIS — R109 Unspecified abdominal pain: Secondary | ICD-10-CM | POA: Insufficient documentation

## 2024-01-18 DIAGNOSIS — J302 Other seasonal allergic rhinitis: Secondary | ICD-10-CM | POA: Insufficient documentation

## 2024-01-21 ENCOUNTER — Telehealth: Payer: Self-pay

## 2024-01-21 ENCOUNTER — Ambulatory Visit: Payer: Self-pay | Admitting: Physician Assistant

## 2024-01-21 DIAGNOSIS — B9689 Other specified bacterial agents as the cause of diseases classified elsewhere: Secondary | ICD-10-CM

## 2024-01-21 LAB — CERVICOVAGINAL ANCILLARY ONLY
Bacterial Vaginitis (gardnerella): POSITIVE — AB
Candida Glabrata: NEGATIVE
Candida Vaginitis: NEGATIVE
Chlamydia: NEGATIVE
Comment: NEGATIVE
Comment: NEGATIVE
Comment: NEGATIVE
Comment: NEGATIVE
Comment: NEGATIVE
Comment: NORMAL
Neisseria Gonorrhea: NEGATIVE
Trichomonas: NEGATIVE

## 2024-01-21 LAB — POCT URINE PREGNANCY: Preg Test, Ur: NEGATIVE

## 2024-01-21 MED ORDER — METRONIDAZOLE 500 MG PO TABS: 500.0000 mg | ORAL_TABLET | Freq: Two times a day (BID) | ORAL | 0 refills | Status: AC

## 2024-01-21 NOTE — Telephone Encounter (Signed)
 Copied from CRM [id number]. Topic: Clinical - Medication Prior Auth >> Jan 21, 2024  1:26 PM Tiffini S wrote: Reason for CRM: Dawn with [facility] called requesting prior authorization for CT Scan Head w/o Contrast  for 01/23/24 at 11:30am for insurance [facility]. Needs the request to be approved before the scheduled appointment.

## 2024-01-23 ENCOUNTER — Ambulatory Visit: Admitting: Physician Assistant

## 2024-01-23 ENCOUNTER — Ambulatory Visit
Admission: RE | Admit: 2024-01-23 | Discharge: 2024-01-23 | Disposition: A | Discharge status: Home / Self Care | Source: Ambulatory Visit | Attending: Physician Assistant | Admitting: Physician Assistant

## 2024-01-23 VITALS — BP 130/92 | HR 76 | Resp 16 | Ht 66.0 in | Wt 168.0 lb

## 2024-01-23 DIAGNOSIS — F39 Unspecified mood [affective] disorder: Secondary | ICD-10-CM | POA: Diagnosis not present

## 2024-01-23 DIAGNOSIS — I10 Essential (primary) hypertension: Secondary | ICD-10-CM | POA: Insufficient documentation

## 2024-01-23 DIAGNOSIS — G4489 Other headache syndrome: Secondary | ICD-10-CM

## 2024-01-23 DIAGNOSIS — M7989 Other specified soft tissue disorders: Secondary | ICD-10-CM | POA: Diagnosis not present

## 2024-01-23 DIAGNOSIS — J3089 Other allergic rhinitis: Secondary | ICD-10-CM

## 2024-01-23 NOTE — Progress Notes (Signed)
 Established patient visit  Patient: Kristen Chung   DOB: June 23, 1986   38 y.o. Female  MRN: [id number] Visit Date: 01/23/2024  Today's healthcare provider: Jolynn Spencer, PA-C   No chief complaint on file.  Subjective       Discussed the use of AI scribe software for clinical note transcription with the patient, who gave verbal consent to proceed.  History of Present Illness Kristain L Gaber is a 38 year old female with hypertension and depression who presents for blood pressure management and medication review.  Blood pressure readings at home are slightly elevated in the mornings, around 121/85 mmHg, but normalize later in the day. Mornings are stressful due to managing three children. She takes valsartan  40 mg and hydrochlorothiazide  12.5 mg, with the latter taken in the morning to avoid nocturia.  She takes Lexapro  10 mg for anxiety and depression. Initially, it caused mild discomfort, but she reports improvement over the past three days, feeling more active and having fewer negative thoughts. She engages in daily activities such as cleaning.  There is a history of headaches, but none currently. A CT scan is scheduled, and she is seeking insurance approval for a neurology consultation.  She has referrals to a gastroenterologist for Crohn's disease and IBS, and an OBGYN for IUD and cervical evaluation.  She forgot her Claritin  this morning and has watery eyes. She usually takes Zyrtec , which she finds more effective, and plans to take it at home.  Occasional foot swelling resolves by morning. She uses a sleep number bed to elevate her feet, which helps with the swelling.  She started a new job today at [employer], near her home in [place].       01/17/2024   11:42 AM 08/22/2023    2:20 PM 09/08/2020    8:57 AM  Depression screen PHQ 2/9  Decreased Interest 2 0 2  Down, Depressed, Hopeless 2 0 1  PHQ - 2 Score 4 0 3  Altered sleeping 3 0 3  Tired, decreased energy 3 0 2   Change in appetite 2 0 2  Feeling bad or failure about yourself  2 0 1  Trouble concentrating 2 0 3  Moving slowly or fidgety/restless 2 0 0  Suicidal thoughts 2 0 1  PHQ-9 Score 20 0 15  Difficult doing work/chores   Not difficult at all      01/17/2024   11:43 AM 08/22/2023    2:20 PM  GAD 7 : Generalized Anxiety Score  Nervous, Anxious, on Edge 2 0  Control/stop worrying 2 0  Worry too much - different things 3 0  Trouble relaxing 2 0  Restless 2 0  Easily annoyed or irritable 2 0  Afraid - awful might happen 0 0  Total GAD 7 Score 13 0  Anxiety Difficulty Somewhat difficult     Medications: Outpatient Medications Prior to Visit  Medication Sig   Blood Pressure KIT Measure BP at home daily   escitalopram  (LEXAPRO ) 10 MG tablet Take 1 tablet (10 mg total) by mouth daily.   hydrochlorothiazide  (HYDRODIURIL ) 12.5 MG tablet Take 1 tablet (12.5 mg total) by mouth daily.   metroNIDAZOLE  (FLAGYL ) 500 MG tablet Take 1 tablet (500 mg total) by mouth 2 (two) times daily for 7 days.   valsartan  (DIOVAN ) 40 MG tablet Take 1 tablet (40 mg total) by mouth daily.   No facility-administered medications prior to visit.    Review of Systems All negative Except see HPI  Objective    BP (!) 130/92 (BP Location: Left Arm, Patient Position: Sitting, Cuff Size: Normal)   Pulse 76   Resp 16   Ht 5' 6 (1.676 m)   Wt 168 lb (76.2 kg)   SpO2 100%   BMI 27.12 kg/m     Physical Exam Vitals reviewed.  Constitutional:      General: She is not in acute distress.    Appearance: Normal appearance. She is well-developed. She is not diaphoretic.  HENT:     Head: Normocephalic and atraumatic.  Eyes:     General: No scleral icterus.    Conjunctiva/sclera: Conjunctivae normal.  Neck:     Thyroid: No thyromegaly.  Cardiovascular:     Rate and Rhythm: Normal rate and regular rhythm.     Pulses: Normal pulses.     Heart sounds: Normal heart sounds. No murmur heard. Pulmonary:      Effort: Pulmonary effort is normal. No respiratory distress.     Breath sounds: Normal breath sounds. No wheezing, rhonchi or rales.  Musculoskeletal:     Cervical back: Neck supple.     Right lower leg: No edema.     Left lower leg: No edema.  Lymphadenopathy:     Cervical: No cervical adenopathy.  Skin:    General: Skin is warm and dry.     Findings: No rash.  Neurological:     Mental Status: She is alert and oriented to person, place, and time. Mental status is at baseline.  Psychiatric:        Mood and Affect: Mood normal.        Behavior: Behavior normal.      No results found for any visits on 01/23/24.      Assessment & Plan Hypertension Chronic Unstable Blood pressure elevated in clinic, normal at home. Possible stress-related elevation. Current medications: valsartan  40 mg, hydrochlorothiazide  12.5 mg. - Instruct to bring blood pressure cuff to next appointment for calibration. - Advise to take hydrochlorothiazide  in the morning. - Continue valsartan  and hydrochlorothiazide  as prescribed. - Monitor blood pressure at home, taking readings in the morning and evening before medication. - Schedule follow-up appointment in one month to reassess blood pressure control. Will follow-up  Depression and Anxiety/mood disorder Chronic Improved mood and activity levels on Lexapro  10 mg. Initial side effects resolved. Patient wishes to continue medication. - Continue Lexapro  10 mg as prescribed. Will follow-up  Headaches Occasional headaches, no current headache. Referred to neurology, CT scan scheduled. - Ensure CT scan is completed as scheduled. - Contact insurance to confirm approval for CT scan. - Follow up with neurology as planned.  Leg swelling Occasional foot swelling after prolonged standing or walking, resolves with elevation. - Advise use of compression stockings to manage edema. - Elevate feet when possible, especially after prolonged standing or  walking.  Allergic Rhinitis Symptoms due to missed Zyrtec  dose. Switched from Claritin  to Zyrtec  for stronger effect. - Continue Zyrtec  for allergic rhinitis. - Ensure to take Zyrtec  daily to prevent symptoms.    No orders of the defined types were placed in this encounter.   Return in about 4 weeks (around 02/20/2024) for chronic disease f/u, BP f/u.   The patient was advised to call back or seek an in-person evaluation if the symptoms worsen or if the condition fails to improve as anticipated.  I discussed the assessment and treatment plan with the patient. The patient was provided an opportunity to ask questions and all were answered. The patient  agreed with the plan and demonstrated an understanding of the instructions.  I, Cem Kosman, PA-C have reviewed all documentation for this visit. The documentation on 01/23/2024  for the exam, diagnosis, procedures, and orders are all accurate and complete.  Jolynn Spencer, [facility], MMS [facility] [phone] (phone) [phone] (fax)  [facility]

## 2024-01-27 ENCOUNTER — Encounter: Payer: Self-pay | Admitting: Physician Assistant

## 2024-01-30 ENCOUNTER — Encounter: Payer: Self-pay | Admitting: Physician Assistant

## 2024-01-30 LAB — URINALYSIS, ROUTINE W REFLEX MICROSCOPIC
Bilirubin, UA: NEGATIVE
Glucose, UA: NEGATIVE
Ketones, UA: NEGATIVE
Leukocytes,UA: NEGATIVE
Nitrite, UA: NEGATIVE
Protein,UA: NEGATIVE
RBC, UA: NEGATIVE
Specific Gravity, UA: 1.019 (ref 1.005–1.030)
Urobilinogen, Ur: 0.2 mg/dL (ref 0.2–1.0)
pH, UA: 6.5 (ref 5.0–7.5)

## 2024-01-30 LAB — CBC WITH DIFFERENTIAL/PLATELET
Basophils Absolute: 0 x10E3/uL (ref 0.0–0.2)
Basos: 1 %
EOS (ABSOLUTE): 0.2 x10E3/uL (ref 0.0–0.4)
Eos: 3 %
Hematocrit: 39.9 % (ref 34.0–46.6)
Hemoglobin: 12.7 g/dL (ref 11.1–15.9)
Immature Grans (Abs): 0 x10E3/uL (ref 0.0–0.1)
Immature Granulocytes: 0 %
Lymphocytes Absolute: 1.8 x10E3/uL (ref 0.7–3.1)
Lymphs: 39 %
MCH: 28 pg (ref 26.6–33.0)
MCHC: 31.8 g/dL (ref 31.5–35.7)
MCV: 88 fL (ref 79–97)
Monocytes Absolute: 0.4 x10E3/uL (ref 0.1–0.9)
Monocytes: 9 %
Neutrophils Absolute: 2.2 x10E3/uL (ref 1.4–7.0)
Neutrophils: 48 %
Platelets: 280 x10E3/uL (ref 150–450)
RBC: 4.53 x10E6/uL (ref 3.77–5.28)
RDW: 11.8 % (ref 11.7–15.4)
WBC: 4.6 x10E3/uL (ref 3.4–10.8)

## 2024-01-30 LAB — LACTIC ACID, PLASMA: Lactate, Ven: 7.1 mg/dL (ref 4.8–25.7)

## 2024-01-30 LAB — COMPREHENSIVE METABOLIC PANEL WITH GFR
ALT: 9 IU/L (ref 0–32)
AST: 13 IU/L (ref 0–40)
Albumin: 4.3 g/dL (ref 3.9–4.9)
Alkaline Phosphatase: 47 IU/L (ref 44–121)
BUN/Creatinine Ratio: 17 (ref 9–23)
BUN: 12 mg/dL (ref 6–20)
Bilirubin Total: <0.2 mg/dL (ref 0.0–1.2)
CO2: 18 mmol/L — ABNORMAL LOW (ref 20–29)
Calcium: 9.3 mg/dL (ref 8.7–10.2)
Chloride: 102 mmol/L (ref 96–106)
Creatinine, Ser: 0.72 mg/dL (ref 0.57–1.00)
Globulin, Total: 2.6 g/dL (ref 1.5–4.5)
Glucose: 88 mg/dL (ref 70–99)
Potassium: 4.2 mmol/L (ref 3.5–5.2)
Sodium: 137 mmol/L (ref 134–144)
Total Protein: 6.9 g/dL (ref 6.0–8.5)
eGFR: 110 mL/min/1.73 (ref >59)

## 2024-01-30 LAB — TSH+FREE T4
Free T4: 0.89 ng/dL (ref 0.82–1.77)
TSH: 1.68 u[IU]/mL (ref 0.450–4.500)

## 2024-01-30 LAB — HIV ANTIBODY (ROUTINE TESTING W REFLEX): HIV Screen 4th Generation wRfx: NONREACTIVE

## 2024-02-07 ENCOUNTER — Ambulatory Visit: Payer: Self-pay

## 2024-02-07 ENCOUNTER — Other Ambulatory Visit: Payer: Self-pay | Admitting: Physician Assistant

## 2024-02-07 DIAGNOSIS — B9689 Other specified bacterial agents as the cause of diseases classified elsewhere: Secondary | ICD-10-CM

## 2024-02-07 MED ORDER — METRONIDAZOLE 500 MG PO TABS: 500.0000 mg | ORAL_TABLET | Freq: Two times a day (BID) | ORAL | 0 refills | Status: AC

## 2024-02-07 MED ORDER — METRONIDAZOLE 500 MG PO TABS: 500.0000 mg | ORAL_TABLET | Freq: Two times a day (BID) | ORAL | 0 refills | Status: DC

## 2024-02-07 NOTE — Telephone Encounter (Signed)
 Please see the message below, see the encounter on 7/1

## 2024-02-07 NOTE — Telephone Encounter (Signed)
 FYI Only or Action Required?: Action required by provider: medication refill request and update on patient condition.  Patient was last seen in primary care on 01/23/24.  Called Nurse Triage reporting update and question.  Symptoms began several weeks ago.  Interventions attempted: Nothing.  Symptoms are: unchanged.  Triage Disposition: Call Kristen Within 24 Hours  Patient/caregiver understands and will follow disposition?:   Copied from CRM [id number]. Topic: Clinical - Medication Question >> Feb 07, 2024  3:18 PM Kristen Chung wrote: Reason for CRM:  Kristen Chung just read email from Kristen Chung dated 01-21-24 about the following Your test is positive for bacterial vaginitis Med was sent to your pharmacy, please, schedule follow-up after completion of your medications, esp if symptoms worsen or persist She call today and there is no medication to be picked up. [facility]. Kristen Chung does not know the name of the medications.  Thanks Reason for Disposition  [1] Follow-up call from patient regarding patient's clinical status AND [2] information NON-URGENT  Answer Assessment - Initial Assessment Questions 1. REASON FOR CALL or QUESTION: What is your reason for calling today? or How can I best     Calling today for follow up and request medication order be resent to pharmacy.   Patient called to say she just read a message on her Mychart from Kristen Chung dated 01/21/24 that her BV test was positive, medication was sent to pharmacy, to schedule follow up after completing medication. She called to pharmacy but they stated they do not have any current prescriptions for her, she is requesting that Kristen resend prescription.   This Clinical research associate asked if was still experiencing symptoms, she states she never had symptoms to begin with and would like medication order sent to pharmacy for treatment, she will follow up once medication is finished.   Please advise.  Protocols  used: Kristen Call - No Triage-A-AH

## 2024-02-07 NOTE — Addendum Note (Signed)
 Addended by: Mubarak Bevens on: 02/07/2024 04:58 PM   Modules accepted: Orders

## 2024-02-19 ENCOUNTER — Other Ambulatory Visit: Payer: Self-pay | Admitting: Physician Assistant

## 2024-02-19 DIAGNOSIS — I10 Essential (primary) hypertension: Secondary | ICD-10-CM

## 2024-02-19 NOTE — Telephone Encounter (Unsigned)
 Copied from CRM [id number]. Topic: Clinical - Medication Refill >> Feb 19, 2024 10:00 AM Cynthia K wrote: Medication: valsartan  (DIOVAN ) 40 MG tablet  Has the patient contacted their pharmacy? Yes (Agent: If no, request that the patient contact the pharmacy for the refill. If patient does not wish to contact the pharmacy document the reason why and proceed with request.) (Agent: If yes, when and what did the pharmacy advise?) Pharmacy needs order to refill  This is the patient's preferred pharmacy:  [id number] - [address] [place] [address] Phone: [phone] Fax: [phone]  Is this the correct pharmacy for this prescription? Yes If no, delete pharmacy and type the correct one.   Has the prescription been filled recently? No  Is the patient out of the medication? Yes  Has the patient been seen for an appointment in the last year OR does the patient have an upcoming appointment? Yes  Can we respond through MyChart? Yes  Agent: Please be advised that Rx refills may take up to 3 business days. We ask that you follow-up with your pharmacy.

## 2024-02-20 ENCOUNTER — Ambulatory Visit: Admitting: Physician Assistant

## 2024-02-20 ENCOUNTER — Encounter: Payer: Self-pay | Admitting: Physician Assistant

## 2024-02-20 VITALS — BP 140/100 | HR 66 | Resp 14 | Ht 63.0 in | Wt 166.8 lb

## 2024-02-20 DIAGNOSIS — F419 Anxiety disorder, unspecified: Secondary | ICD-10-CM

## 2024-02-20 DIAGNOSIS — F39 Unspecified mood [affective] disorder: Secondary | ICD-10-CM | POA: Diagnosis not present

## 2024-02-20 DIAGNOSIS — I10 Essential (primary) hypertension: Secondary | ICD-10-CM

## 2024-02-20 DIAGNOSIS — Z716 Tobacco abuse counseling: Secondary | ICD-10-CM

## 2024-02-20 DIAGNOSIS — F32A Depression, unspecified: Secondary | ICD-10-CM

## 2024-02-20 DIAGNOSIS — M2550 Pain in unspecified joint: Secondary | ICD-10-CM | POA: Insufficient documentation

## 2024-02-20 MED ORDER — VALSARTAN 40 MG PO TABS: 40.0000 mg | ORAL_TABLET | Freq: Every day | ORAL | 1 refills | Status: DC

## 2024-02-20 MED ORDER — ESCITALOPRAM OXALATE 10 MG PO TABS: 10.0000 mg | ORAL_TABLET | Freq: Every day | ORAL | 1 refills | Status: DC

## 2024-02-20 NOTE — Telephone Encounter (Signed)
 Requested Prescriptions  Pending Prescriptions Disp Refills   valsartan  (DIOVAN ) 40 MG tablet 90 tablet 1    Sig: Take 1 tablet (40 mg total) by mouth daily.     Cardiovascular:  Angiotensin Receptor Blockers Failed - 02/20/2024 10:47 AM      Failed - Last BP in normal range    BP Readings from Last 1 Encounters:  01/23/24 (!) 130/92         Passed - Cr in normal range and within 180 days    Creatinine, Ser  Date Value Ref Range Status  01/17/2024 0.72 0.57 - 1.00 mg/dL Final         Passed - K in normal range and within 180 days    Potassium  Date Value Ref Range Status  01/17/2024 4.2 3.5 - 5.2 mmol/L Final         Passed - Patient is not pregnant      Passed - Valid encounter within last 6 months    Recent Outpatient Visits           4 weeks ago Hypertension, unspecified type   [facility] [place], Janna, PA-C   1 month ago Other fatigue   [facility] [place], [place], [place]       Future Appointments             In 3 weeks Agbor-Etang, Redell, MD [facility]

## 2024-02-20 NOTE — Progress Notes (Signed)
 Established patient visit  Patient: Kristen Chung   DOB: 1986/02/08   38 y.o. Female  MRN: [id number] Visit Date: 02/20/2024  Today's healthcare provider: Jolynn Spencer, PA-C   Chief Complaint  Patient presents with   Hypertension    Has not been monitoring BP. States of feeling better with BP. Has a GI appt on 8/31.   Subjective     HPI     Hypertension    Additional comments: Has not been monitoring BP. States of feeling better with BP. Has a GI appt on 8/31.      Last edited by Wilfred Hargis RAMAN, CMA on 02/20/2024 10:57 AM.       Discussed the use of AI scribe software for clinical note transcription with the patient, who gave verbal consent to proceed.  History of Present Illness Kristen Chung is a 38 year old female with hypertension and anxiety who presents for medication management and follow-up. She is accompanied by her daughter.  She experiences chest pain and shortness of breath. She takes hydrochlorothiazide  in the morning and valsartan  at night for hypertension but does not measure her blood pressure regularly. Anxiety is managed with escitalopram , which has improved her mood without the need for dosage adjustment. Joint pain affects her hands, wrists, ankles, and hip, with severe episodes on some days. She walks regularly but does not engage in stretching or other exercises. She is attempting to quit smoking and has reduced her consumption significantly. She is uncertain about her HPV vaccination status and has not received the COVID vaccine, preferring to review her immunization records first.       02/20/2024   11:02 AM 01/17/2024   11:42 AM 08/22/2023    2:20 PM  Depression screen PHQ 2/9  Decreased Interest 0 2 0  Down, Depressed, Hopeless 1 2 0  PHQ - 2 Score 1 4 0  Altered sleeping 3 3 0  Tired, decreased energy 2 3 0  Change in appetite 2 2 0  Feeling bad or failure about yourself  1 2 0  Trouble concentrating 3 2 0  Moving slowly or fidgety/restless  2 2 0  Suicidal thoughts 1 2 0  PHQ-9 Score 15 20 0  Difficult doing work/chores Somewhat difficult        02/20/2024   11:02 AM 01/17/2024   11:43 AM 08/22/2023    2:20 PM  GAD 7 : Generalized Anxiety Score  Nervous, Anxious, on Edge 2 2 0  Control/stop worrying 3 2 0  Worry too much - different things 3 3 0  Trouble relaxing 2 2 0  Restless 2 2 0  Easily annoyed or irritable 3 2 0  Afraid - awful might happen 2 0 0  Total GAD 7 Score 17 13 0  Anxiety Difficulty Somewhat difficult Somewhat difficult     Medications: Outpatient Medications Prior to Visit  Medication Sig   Blood Pressure KIT Measure BP at home daily   hydrochlorothiazide  (HYDRODIURIL ) 12.5 MG tablet Take 1 tablet (12.5 mg total) by mouth daily.   valsartan  (DIOVAN ) 40 MG tablet Take 1 tablet (40 mg total) by mouth daily.   [DISCONTINUED] escitalopram  (LEXAPRO ) 10 MG tablet Take 1 tablet (10 mg total) by mouth daily.   No facility-administered medications prior to visit.    Review of Systems All negative Except see HPI       Objective    BP (!) 140/100 (BP Location: Right Arm, Patient Position: Sitting, Cuff Size: Normal)  Pulse 66   Resp 14   Ht 5' 3 (1.6 m)   Wt 166 lb 12.8 oz (75.7 kg)   LMP 02/19/2024   SpO2 100%   BMI 29.55 kg/m     Physical Exam Vitals reviewed.  Constitutional:      General: She is not in acute distress.    Appearance: Normal appearance. She is well-developed. She is not diaphoretic.  HENT:     Head: Normocephalic and atraumatic.  Eyes:     General: No scleral icterus.    Conjunctiva/sclera: Conjunctivae normal.  Neck:     Thyroid: No thyromegaly.  Cardiovascular:     Rate and Rhythm: Normal rate and regular rhythm.     Pulses: Normal pulses.     Heart sounds: Normal heart sounds. No murmur heard. Pulmonary:     Effort: Pulmonary effort is normal. No respiratory distress.     Breath sounds: Normal breath sounds. No wheezing, rhonchi or rales.   Musculoskeletal:     Cervical back: Neck supple.     Right lower leg: No edema.     Left lower leg: No edema.  Lymphadenopathy:     Cervical: No cervical adenopathy.  Skin:    General: Skin is warm and dry.     Findings: No rash.  Neurological:     Mental Status: She is alert and oriented to person, place, and time. Mental status is at baseline.  Psychiatric:        Mood and Affect: Mood normal.        Behavior: Behavior normal.      No results found for any visits on 02/20/24.      Assessment & Plan Hypertension Chronic Hypertension management required adjustment due to inadequate control. Risk of prolonged hypertension includes potential damage to organs. - Increase hydrochlorothiazide  to 25 mg, two tablets daily. - Increase valsartan  to two tablets twice daily. - Instruct to measure blood pressure at home regularly and report readings. - Advise on low-salt diet and regular exercise. - Schedule follow-up in one month to reassess blood pressure control.  Major depressive disorder Current treatment with escitalopram  is effective with reported improvement in mood and anxiety. - Continue escitalopram  at current dosage of 10mg . - Offer counseling services available at the facility once a week.  Arthralgia Reports of joint pain in hands, wrists, ankles, and hip. Exercise limited to walking. - Recommend starting a stretching routine to alleviate joint pain and prevent progression.  Tobacco use disorder Smoking reduction noted, but cessation not yet achieved. Discussed options for smoking cessation aids. - Encourage contacting quit smoking programs for free cessation aids. - Suggest trying nicotine gum as a cessation aid.   Orders Placed This Encounter  Procedures   Ambulatory referral to Integrated Behavioral Health    Referral Priority:   Routine    Referral Type:   Consultation    Referral Reason:   Specialty Services Required    Number of Visits Requested:   1     Return in about 4 weeks (around 03/19/2024) for BP f/u.   The patient was advised to call back or seek an in-person evaluation if the symptoms worsen or if the condition fails to improve as anticipated.  I discussed the assessment and treatment plan with the patient. The patient was provided an opportunity to ask questions and all were answered. The patient agreed with the plan and demonstrated an understanding of the instructions.  I, Regana Kemple, PA-C have reviewed all documentation for this  visit. The documentation on 02/20/2024  for the exam, diagnosis, procedures, and orders are all accurate and complete.  Jolynn Spencer, [facility], MMS [facility] [phone] (phone) [phone] (fax)  [facility]

## 2024-03-09 ENCOUNTER — Telehealth: Payer: Self-pay | Admitting: Licensed Clinical Social Worker

## 2024-03-09 ENCOUNTER — Ambulatory Visit (INDEPENDENT_AMBULATORY_CARE_PROVIDER_SITE_OTHER): Admitting: Licensed Clinical Social Worker

## 2024-03-09 NOTE — Telephone Encounter (Signed)
 Pt reports she did not show for her IBH appointment because she is sick and will call the office back to reschedule her appointment

## 2024-03-11 NOTE — BH Specialist Note (Signed)
 Pt did not show for appointment today. LCSW called pt and pt informed LCSW that she was sick and would call the practice to reschedule appointment.  LCSW informed office staff.

## 2024-03-12 ENCOUNTER — Ambulatory Visit: Attending: Cardiology | Admitting: Cardiology

## 2024-03-12 ENCOUNTER — Encounter: Payer: Self-pay | Admitting: Cardiology

## 2024-03-12 VITALS — BP 110/80 | HR 61 | Ht 63.0 in | Wt 171.4 lb

## 2024-03-12 DIAGNOSIS — I10 Essential (primary) hypertension: Secondary | ICD-10-CM | POA: Insufficient documentation

## 2024-03-12 DIAGNOSIS — R0602 Shortness of breath: Secondary | ICD-10-CM | POA: Insufficient documentation

## 2024-03-12 DIAGNOSIS — F17200 Nicotine dependence, unspecified, uncomplicated: Secondary | ICD-10-CM | POA: Diagnosis present

## 2024-03-12 DIAGNOSIS — R072 Precordial pain: Secondary | ICD-10-CM | POA: Diagnosis not present

## 2024-03-12 MED ORDER — METOPROLOL TARTRATE 50 MG PO TABS: ORAL_TABLET | ORAL | 0 refills | Status: DC

## 2024-03-12 NOTE — Progress Notes (Signed)
 Cardiology Office Note:    Date:  03/12/2024   ID:  Kristen Chung, DOB 1985/11/26, MRN [id number]  PCP:  Dineen Channel, PA-C   [facility] Providers Cardiologist:  None     Referring MD: Dineen Channel, PA-C   Chief Complaint  Patient presents with   New Patient (Initial Visit)    Patient c/o uncontrolled blood pressure, chest pain weekly, shortness of breath, palpitations, pounding in chest, feeling exhausted.     History of Present Illness:    Kristen Chung is a 38 y.o. female with a hx of hypertension, current smoker x 20 years, history of cocaine use who presents with chest pain and hypertension.  States having chest pain and shortness of breath especially with exertion ongoing over the past several months.  Endorses using cocaine, last use 2 weeks ago.  Working on cutting back on smoking and also drug use due to her plans on getting pregnant in the near future.  Mother has a history of congestive heart failure, also has ICD.  BP adequately controlled on losartan and HCTZ as prescribed.  Past Medical History:  Diagnosis Date   Allergy    Anemia    Anxiety    BPPV (benign paroxysmal positional vertigo)    Chlamydia    Crohn disease (HCC)    Depression    No specific treatment   Eczema    Esophagitis    Gastritis    Genital warts    Gonorrhea    Hearing loss    Heart murmur    Hiatal hernia    Kidney stone    PID (acute pelvic inflammatory disease)    Pregnancy induced hypertension    Preterm labor    Substance abuse (HCC)    Urinary tract infection    Urticaria     Past Surgical History:  Procedure Laterality Date   abortion     x2    Current Medications: Current Meds  Medication Sig   escitalopram  (LEXAPRO ) 10 MG tablet Take 1 tablet (10 mg total) by mouth daily.   hydrochlorothiazide  (HYDRODIURIL ) 12.5 MG tablet Take 1 tablet (12.5 mg total) by mouth daily.   metoprolol  tartrate (LOPRESSOR ) 50 MG tablet TAKE 1 TABLET 2 HR PRIOR TO CARDIAC  PROCEDURE   valsartan  (DIOVAN ) 40 MG tablet Take 1 tablet (40 mg total) by mouth daily.     Allergies:   Celery oil, Codone [hydrocodone ], Dilaudid [hydromorphone hcl], Hydromorphone hcl, Molds & smuts, Other, Strawberry extract, and Tramadol    Social History   Socioeconomic History   Marital status: Single    Spouse name: Not on file   Number of children: 2   Years of education: Not on file   Highest education level: Not on file  Occupational History   Not on file  Tobacco Use   Smoking status: Some Days    Current packs/day: 0.25    Average packs/day: 0.3 packs/day for 6.0 years (1.5 ttl pk-yrs)    Types: Cigarettes   Smokeless tobacco: Never  Vaping Use   Vaping status: Every Day   Substances: Nicotine, Flavoring  Substance and Sexual Activity   Alcohol use: Yes    Alcohol/week: 9.0 - 16.0 standard drinks of alcohol    Types: 4 - 6 Cans of beer, 5 - 10 Shots of liquor per week   Drug use: Yes    Types: Marijuana, Cocaine    Comment: daily   Sexual activity: Yes    Birth control/protection: None  Other  Topics Concern   Not on file  Social History Narrative   Lives two children. Recently took out 50-B on the FOB.   Social Drivers of Corporate investment banker Strain: Low Risk  (02/20/2024)   Overall Financial Resource Strain (CARDIA)    Difficulty of Paying Living Expenses: Not hard at all  Food Insecurity: No Food Insecurity (02/20/2024)   Hunger Vital Sign    Worried About Running Out of Food in the Last Year: Never true    Ran Out of Food in the Last Year: Never true  Transportation Needs: No Transportation Needs (02/20/2024)   PRAPARE - Administrator, Civil Service (Medical): No    Lack of Transportation (Non-Medical): No  Physical Activity: Sufficiently Active (02/20/2024)   Exercise Vital Sign    Days of Exercise per Week: 7 days    Minutes of Exercise per Session: 60 min  Stress: No Stress Concern Present (02/20/2024)   [employer] of  Occupational Health - Occupational Stress Questionnaire    Feeling of Stress: Not at all  Social Connections: Moderately Isolated (02/20/2024)   Social Connection and Isolation Panel    Frequency of Communication with Friends and Family: More than three times a week    Frequency of Social Gatherings with Friends and Family: Never    Attends Religious Services: Never    Database administrator or Organizations: No    Attends Engineer, structural: Never    Marital Status: Living with partner     Family History: The patient's family history includes Asthma in her mother and sister; Breast cancer in her sister; Cancer in her father; Depression in her sister; Diabetes in her mother, sister, and another family member; Heart disease in her mother; Hypertension in her mother and sister. There is no history of Anesthesia problems.  ROS:   Please see the history of present illness.     All other systems reviewed and are negative.  EKGs/Labs/Other Studies Reviewed:    The following studies were reviewed today:  EKG Interpretation Date/Time:  Thursday March 12 2024 09:48:20 EDT Ventricular Rate:  61 PR Interval:  158 QRS Duration:  88 QT Interval:  422 QTC Calculation: 424 R Axis:   67  Text Interpretation: Normal sinus rhythm Normal ECG Confirmed by Darliss Rogue ([id number]) on 03/12/2024 9:52:28 AM    Recent Labs: 01/17/2024: ALT 9; BUN 12; Creatinine, Ser 0.72; Hemoglobin 12.7; Platelets 280; Potassium 4.2; Sodium 137; TSH 1.680  Recent Lipid Panel No results found for: CHOL, TRIG, HDL, CHOLHDL, VLDL, LDLCALC, LDLDIRECT   Risk Assessment/Calculations:             Physical Exam:    VS:  BP 110/80 (BP Location: Right Arm, Patient Position: Sitting, Cuff Size: Normal)   Pulse 61   Ht 5' 3 (1.6 m)   Wt 171 lb 6 oz (77.7 kg)   LMP 02/19/2024   SpO2 98%   BMI 30.36 kg/m     Wt Readings from Last 3 Encounters:  03/12/24 171 lb 6 oz (77.7 kg)  02/20/24  166 lb 12.8 oz (75.7 kg)  01/23/24 168 lb (76.2 kg)     GEN:  Well nourished, well developed in no acute distress HEENT: Normal NECK: No JVD; No carotid bruits CARDIAC: RRR, no murmurs, rubs, gallops RESPIRATORY:  Clear to auscultation without rales, wheezing or rhonchi  ABDOMEN: Soft, non-tender, non-distended MUSCULOSKELETAL:  No edema; No deformity  SKIN: Warm and dry NEUROLOGIC:  Alert and  oriented x 3 PSYCHIATRIC:  Normal affect   ASSESSMENT:    1. Precordial pain   2. Primary hypertension   3. SOB (shortness of breath)   4. Current smoker    PLAN:    In order of problems listed above:  Chest pain, evidence of breath several risk factors.  Get echo, get coronary CTA to evaluate CAD. Hypertension, BP controlled.  Continue valsartan  40 mg daily, HCTZ 12.5 mg daily. Shortness of breath, echo as above. Current smoker and cocaine use.  Complete cessation advised.  Follow-up after cardiac testing.     Medication Adjustments/Labs and Tests Ordered: Current medicines are reviewed at length with the patient today.  Concerns regarding medicines are outlined above.  Orders Placed This Encounter  Procedures   CT CORONARY MORPH W/CTA COR W/SCORE W/CA W/CM &/OR WO/CM   Basic metabolic panel with GFR   EKG 87-Ozji   ECHOCARDIOGRAM COMPLETE   Meds ordered this encounter  Medications   metoprolol  tartrate (LOPRESSOR ) 50 MG tablet    Sig: TAKE 1 TABLET 2 HR PRIOR TO CARDIAC PROCEDURE    Dispense:  1 tablet    Refill:  0    Patient Instructions  Medication Instructions:  - Take one 100 mg metoprolol  two hours prior to cardiac CT  *If you need a refill on your cardiac medications before your next appointment, please call your pharmacy*  Lab Work: Your provider would like for you to have following labs drawn today BMP.    If you have labs (blood work) drawn today and your tests are completely normal, you will receive your results only by: MyChart Message (if you have  MyChart) OR A paper copy in the mail If you have any lab test that is abnormal or we need to change your treatment, we will call you to review the results.  Testing/Procedures: Your physician has requested that you have an echocardiogram. Echocardiography is a painless test that uses sound waves to create images of your heart. It provides your doctor with information about the size and shape of your heart and how well your heart's chambers and valves are working.   You may receive an ultrasound enhancing agent through an IV if needed to better visualize your heart during the echo. This procedure takes approximately one hour.  There are no restrictions for this procedure.  This will take place at [address] [facility] [address]) [address], [place] [address]  Please note: We ask at that you not bring children with you during ultrasound (echo/ vascular) testing. Due to room size and safety concerns, children are not allowed in the ultrasound rooms during exams. Our front office staff cannot provide observation of children in our lobby area while testing is being conducted. An adult accompanying a patient to their appointment will only be allowed in the ultrasound room at the discretion of the ultrasound technician under special circumstances. We apologize for any inconvenience.   Your cardiac CT will be scheduled at:  [facility] [address] [place], [place] [address] [phone]  Please arrive 15 mins early for check-in and test prep.  There is spacious parking and easy access to the radiology department from the [facility] Heart and Vascular entrance. Please enter here and check-in with the desk attendant.    Please follow these instructions carefully (unless otherwise directed):  An IV will be required for this test and Nitroglycerin will be given.  Hold all erectile dysfunction medications at least 3 days (72  hrs) prior to test. (Ie viagra, cialis,  sildenafil, tadalafil, etc)     On the Night Before the Test: Be sure to Drink plenty of water. Do not consume any caffeinated/decaffeinated beverages or chocolate 12 hours prior to your test. Do not take any antihistamines 12 hours prior to your test.  On the Day of the Test: Drink plenty of water until 1 hour prior to the test. Do not eat any food 1 hour prior to test. You may take your regular medications prior to the test.  Take metoprolol  (Lopressor ) two hours prior to test. If you take Furosemide/Hydrochlorothiazide /Spironolactone/Chlorthalidone, please HOLD on the morning of the test. Patients who wear a continuous glucose monitor MUST remove the device prior to scanning. FEMALES- please wear underwire-free bra if available, avoid dresses & tight clothing       After the Test: Drink plenty of water. After receiving IV contrast, you may experience a mild flushed feeling. This is normal. On occasion, you may experience a mild rash up to 24 hours after the test. This is not dangerous. If this occurs, you can take Benadryl  25 mg, Zyrtec , Claritin , or Allegra and increase your fluid intake. (Patients taking Tikosyn should avoid Benadryl , and may take Zyrtec , Claritin , or Allegra) If you experience trouble breathing, this can be serious. If it is severe call 911 IMMEDIATELY. If it is mild, please call our office.  We will call to schedule your test 2-4 weeks out understanding that some insurance companies will need an authorization prior to the service being performed.   For more information and frequently asked questions, please visit our website : [email or web address]  For non-scheduling related questions, please contact the cardiac imaging nurse navigator should you have any questions/concerns: Cardiac Imaging Nurse Navigators Direct Office Dial: [phone]   For scheduling needs, including cancellations and rescheduling, please call [place], [phone].     Follow-Up: At [facility], you and your health needs are our priority.  As part of our continuing mission to provide you with exceptional heart care, our providers are all part of one team.  This team includes your primary Cardiologist (physician) and Advanced Practice Providers or APPs (Physician Assistants and Nurse Practitioners) who all work together to provide you with the care you need, when you need it.  Your next appointment:   3 month(s)  Provider:   You may see Dr. Darliss or one of the following Advanced Practice Providers on your designated Care Team:   Lonni Meager, NP Lesley Maffucci, PA-C Bernardino Bring, PA-C Cadence Kasson, PA-C Tylene Lunch, NP Barnie Hila, NP    We recommend signing up for the patient portal called MyChart.  Sign up information is provided on this After Visit Summary.  MyChart is used to connect with patients for Virtual Visits (Telemedicine).  Patients are able to view lab/test results, encounter notes, upcoming appointments, etc.  Non-urgent messages can be sent to your provider as well.   To learn more about what you can do with MyChart, go to [email or web address].          Signed, Redell Darliss, MD  03/12/2024 11:06 AM    [facility]

## 2024-03-12 NOTE — Patient Instructions (Signed)
 Medication Instructions:  - Take one 100 mg metoprolol  two hours prior to cardiac CT  *If you need a refill on your cardiac medications before your next appointment, please call your pharmacy*  Lab Work: Your provider would like for you to have following labs drawn today BMP.    If you have labs (blood work) drawn today and your tests are completely normal, you will receive your results only by: MyChart Message (if you have MyChart) OR A paper copy in the mail If you have any lab test that is abnormal or we need to change your treatment, we will call you to review the results.  Testing/Procedures: Your physician has requested that you have an echocardiogram. Echocardiography is a painless test that uses sound waves to create images of your heart. It provides your doctor with information about the size and shape of your heart and how well your heart's chambers and valves are working.   You may receive an ultrasound enhancing agent through an IV if needed to better visualize your heart during the echo. This procedure takes approximately one hour.  There are no restrictions for this procedure.  This will take place at [address] [facility] [address]) [address], [place] [address]  Please note: We ask at that you not bring children with you during ultrasound (echo/ vascular) testing. Due to room size and safety concerns, children are not allowed in the ultrasound rooms during exams. Our front office staff cannot provide observation of children in our lobby area while testing is being conducted. An adult accompanying a patient to their appointment will only be allowed in the ultrasound room at the discretion of the ultrasound technician under special circumstances. We apologize for any inconvenience.   Your cardiac CT will be scheduled at:  [facility] [address] [place], [place] [address] [phone]  Please arrive 15 mins early for check-in and test  prep.  There is spacious parking and easy access to the radiology department from the [facility] Heart and Vascular entrance. Please enter here and check-in with the desk attendant.    Please follow these instructions carefully (unless otherwise directed):  An IV will be required for this test and Nitroglycerin will be given.  Hold all erectile dysfunction medications at least 3 days (72 hrs) prior to test. (Ie viagra, cialis, sildenafil, tadalafil, etc)     On the Night Before the Test: Be sure to Drink plenty of water. Do not consume any caffeinated/decaffeinated beverages or chocolate 12 hours prior to your test. Do not take any antihistamines 12 hours prior to your test.  On the Day of the Test: Drink plenty of water until 1 hour prior to the test. Do not eat any food 1 hour prior to test. You may take your regular medications prior to the test.  Take metoprolol  (Lopressor ) two hours prior to test. If you take Furosemide/Hydrochlorothiazide /Spironolactone/Chlorthalidone, please HOLD on the morning of the test. Patients who wear a continuous glucose monitor MUST remove the device prior to scanning. FEMALES- please wear underwire-free bra if available, avoid dresses & tight clothing       After the Test: Drink plenty of water. After receiving IV contrast, you may experience a mild flushed feeling. This is normal. On occasion, you may experience a mild rash up to 24 hours after the test. This is not dangerous. If this occurs, you can take Benadryl  25 mg, Zyrtec , Claritin , or Allegra and increase your fluid intake. (Patients taking Tikosyn should  avoid Benadryl , and may take Zyrtec , Claritin , or Allegra) If you experience trouble breathing, this can be serious. If it is severe call 911 IMMEDIATELY. If it is mild, please call our office.  We will call to schedule your test 2-4 weeks out understanding that some insurance companies will need an authorization prior to the service being  performed.   For more information and frequently asked questions, please visit our website : [email or web address]  For non-scheduling related questions, please contact the cardiac imaging nurse navigator should you have any questions/concerns: Cardiac Imaging Nurse Navigators Direct Office Dial: [phone]   For scheduling needs, including cancellations and rescheduling, please call [place], [phone].    Follow-Up: At [facility], you and your health needs are our priority.  As part of our continuing mission to provide you with exceptional heart care, our providers are all part of one team.  This team includes your primary Cardiologist (physician) and Advanced Practice Providers or APPs (Physician Assistants and Nurse Practitioners) who all work together to provide you with the care you need, when you need it.  Your next appointment:   3 month(s)  Provider:   You may see Dr. Darliss or one of the following Advanced Practice Providers on your designated Care Team:   Lonni Meager, NP Lesley Maffucci, PA-C Bernardino Bring, PA-C Cadence [place], PA-C Tylene Lunch, NP Barnie Hila, NP    We recommend signing up for the patient portal called MyChart.  Sign up information is provided on this After Visit Summary.  MyChart is used to connect with patients for Virtual Visits (Telemedicine).  Patients are able to view lab/test results, encounter notes, upcoming appointments, etc.  Non-urgent messages can be sent to your provider as well.   To learn more about what you can do with MyChart, go to [email or web address].

## 2024-03-13 LAB — BASIC METABOLIC PANEL WITH GFR
BUN/Creatinine Ratio: 13 (ref 9–23)
BUN: 10 mg/dL (ref 6–20)
CO2: 19 mmol/L — ABNORMAL LOW (ref 20–29)
Calcium: 8.8 mg/dL (ref 8.7–10.2)
Chloride: 105 mmol/L (ref 96–106)
Creatinine, Ser: 0.75 mg/dL (ref 0.57–1.00)
Glucose: 81 mg/dL (ref 70–99)
Potassium: 4.1 mmol/L (ref 3.5–5.2)
Sodium: 139 mmol/L (ref 134–144)
eGFR: 105 mL/min/1.73 (ref >59)

## 2024-03-20 ENCOUNTER — Ambulatory Visit: Admitting: Physician Assistant

## 2024-03-20 NOTE — Progress Notes (Deleted)
 Established patient visit  Patient: Kristen Chung   DOB: 1986/01/21   38 y.o. Female  MRN: [id number] Visit Date: 03/20/2024  Today's healthcare provider: Jolynn Spencer, PA-C   No chief complaint on file.  Subjective       Discussed the use of AI scribe software for clinical note transcription with the patient, who gave verbal consent to proceed.  History of Present Illness        02/20/2024   11:02 AM 01/17/2024   11:42 AM 08/22/2023    2:20 PM  Depression screen PHQ 2/9  Decreased Interest 0 2 0  Down, Depressed, Hopeless 1 2 0  PHQ - 2 Score 1 4 0  Altered sleeping 3 3 0  Tired, decreased energy 2 3 0  Change in appetite 2 2 0  Feeling bad or failure about yourself  1 2 0  Trouble concentrating 3 2 0  Moving slowly or fidgety/restless 2 2 0  Suicidal thoughts 1 2 0  PHQ-9 Score 15 20 0  Difficult doing work/chores Somewhat difficult        02/20/2024   11:02 AM 01/17/2024   11:43 AM 08/22/2023    2:20 PM  GAD 7 : Generalized Anxiety Score  Nervous, Anxious, on Edge 2 2 0  Control/stop worrying 3 2 0  Worry too much - different things 3 3 0  Trouble relaxing 2 2 0  Restless 2 2 0  Easily annoyed or irritable 3 2 0  Afraid - awful might happen 2 0 0  Total GAD 7 Score 17 13 0  Anxiety Difficulty Somewhat difficult Somewhat difficult     Medications: Outpatient Medications Prior to Visit  Medication Sig   Blood Pressure KIT Measure BP at home daily (Patient not taking: Reported on 03/12/2024)   escitalopram  (LEXAPRO ) 10 MG tablet Take 1 tablet (10 mg total) by mouth daily.   hydrochlorothiazide  (HYDRODIURIL ) 12.5 MG tablet Take 1 tablet (12.5 mg total) by mouth daily.   metoprolol  tartrate (LOPRESSOR ) 50 MG tablet TAKE 1 TABLET 2 HR PRIOR TO CARDIAC PROCEDURE   valsartan  (DIOVAN ) 40 MG tablet Take 1 tablet (40 mg total) by mouth daily.   No facility-administered medications prior to visit.    Review of Systems  All other systems reviewed and are  negative.  All negative Except see HPI   {Insert previous labs (optional):23779} {See past labs  Heme  Chem  Endocrine  Serology  Results Review (optional):1}   Objective    LMP 02/19/2024  {Insert last BP/Wt (optional):23777}{See vitals history (optional):1}   Physical Exam Vitals reviewed.  Constitutional:      General: She is not in acute distress.    Appearance: Normal appearance. She is well-developed. She is not diaphoretic.  HENT:     Head: Normocephalic and atraumatic.  Eyes:     General: No scleral icterus.    Conjunctiva/sclera: Conjunctivae normal.  Neck:     Thyroid: No thyromegaly.  Cardiovascular:     Rate and Rhythm: Normal rate and regular rhythm.     Pulses: Normal pulses.     Heart sounds: Normal heart sounds. No murmur heard. Pulmonary:     Effort: Pulmonary effort is normal. No respiratory distress.     Breath sounds: Normal breath sounds. No wheezing, rhonchi or rales.  Musculoskeletal:     Cervical back: Neck supple.     Right lower leg: No edema.     Left lower leg: No edema.  Lymphadenopathy:  Cervical: No cervical adenopathy.  Skin:    General: Skin is warm and dry.     Findings: No rash.  Neurological:     Mental Status: She is alert and oriented to person, place, and time. Mental status is at baseline.  Psychiatric:        Mood and Affect: Mood normal.        Behavior: Behavior normal.      No results found for any visits on 03/20/24.      Assessment and Plan Assessment & Plan     No orders of the defined types were placed in this encounter.   No follow-ups on file.   The patient was advised to call back or seek an in-person evaluation if the symptoms worsen or if the condition fails to improve as anticipated.  I discussed the assessment and treatment plan with the patient. The patient was provided an opportunity to ask questions and all were answered. The patient agreed with the plan and demonstrated an understanding  of the instructions.  I, Anabela Crayton, PA-C have reviewed all documentation for this visit. The documentation on 03/20/2024  for the exam, diagnosis, procedures, and orders are all accurate and complete.  Jolynn Spencer, [facility], MMS [facility] [phone] (phone) [phone] (fax)  [facility]

## 2024-04-03 ENCOUNTER — Telehealth (HOSPITAL_COMMUNITY): Payer: Self-pay | Admitting: Emergency Medicine

## 2024-04-03 NOTE — Telephone Encounter (Signed)
 Reaching out to patient to offer assistance regarding upcoming cardiac imaging study; pt verbalizes understanding of appt date/time, parking situation and where to check in, pre-test NPO status and medications ordered, and verified current allergies; name and call back number provided for further questions should they arise Kristen Alexandria RN Navigator Cardiac Imaging [facility] [phone] office [phone] cell

## 2024-04-06 ENCOUNTER — Ambulatory Visit
Admission: RE | Admit: 2024-04-06 | Discharge: 2024-04-06 | Disposition: A | Discharge status: Home / Self Care | Source: Ambulatory Visit

## 2024-04-06 ENCOUNTER — Ambulatory Visit
Admission: RE | Admit: 2024-04-06 | Discharge: 2024-04-06 | Disposition: A | Discharge status: Home / Self Care | Source: Ambulatory Visit | Attending: Cardiology | Admitting: Cardiology

## 2024-04-06 ENCOUNTER — Other Ambulatory Visit: Payer: Self-pay

## 2024-04-06 DIAGNOSIS — I2510 Atherosclerotic heart disease of native coronary artery without angina pectoris: Secondary | ICD-10-CM | POA: Diagnosis not present

## 2024-04-06 DIAGNOSIS — R931 Abnormal findings on diagnostic imaging of heart and coronary circulation: Secondary | ICD-10-CM | POA: Insufficient documentation

## 2024-04-06 DIAGNOSIS — R072 Precordial pain: Secondary | ICD-10-CM | POA: Diagnosis present

## 2024-04-06 MED ORDER — IOHEXOL 350 MG/ML SOLN
100.0000 mL | Freq: Once | INTRAVENOUS | Status: AC | PRN
  Administered 2024-04-06: 100 mL via INTRAVENOUS

## 2024-04-06 MED ORDER — NITROGLYCERIN 0.4 MG SL SUBL
0.8000 mg | SUBLINGUAL_TABLET | Freq: Once | SUBLINGUAL | Status: AC
  Administered 2024-04-06: 0.8 mg via SUBLINGUAL
  Filled 2024-04-06: qty 25

## 2024-04-06 NOTE — Progress Notes (Signed)
 Patient tolerated CT well. Drank water after. Vital signs stable encourage to drink water throughout day.Reasons explained and verbalized understanding. Ambulated steady gait.

## 2024-04-07 ENCOUNTER — Ambulatory Visit: Payer: Self-pay | Admitting: Cardiology

## 2024-04-07 DIAGNOSIS — R931 Abnormal findings on diagnostic imaging of heart and coronary circulation: Secondary | ICD-10-CM

## 2024-04-08 NOTE — Telephone Encounter (Signed)
 Patient is returning call.

## 2024-04-16 ENCOUNTER — Encounter: Payer: Self-pay | Admitting: Physician Assistant

## 2024-04-16 ENCOUNTER — Ambulatory Visit: Admitting: Physician Assistant

## 2024-04-16 ENCOUNTER — Ambulatory Visit: Admitting: Internal Medicine

## 2024-04-16 VITALS — BP 131/85 | HR 98 | Resp 14 | Ht 63.0 in | Wt 166.8 lb

## 2024-04-16 DIAGNOSIS — F1491 Cocaine use, unspecified, in remission: Secondary | ICD-10-CM

## 2024-04-16 DIAGNOSIS — K219 Gastro-esophageal reflux disease without esophagitis: Secondary | ICD-10-CM

## 2024-04-16 DIAGNOSIS — I10 Essential (primary) hypertension: Secondary | ICD-10-CM | POA: Diagnosis not present

## 2024-04-16 DIAGNOSIS — Z716 Tobacco abuse counseling: Secondary | ICD-10-CM

## 2024-04-16 DIAGNOSIS — R109 Unspecified abdominal pain: Secondary | ICD-10-CM

## 2024-04-16 DIAGNOSIS — R5383 Other fatigue: Secondary | ICD-10-CM

## 2024-04-16 DIAGNOSIS — Z8719 Personal history of other diseases of the digestive system: Secondary | ICD-10-CM

## 2024-04-16 DIAGNOSIS — F39 Unspecified mood [affective] disorder: Secondary | ICD-10-CM

## 2024-04-16 DIAGNOSIS — K580 Irritable bowel syndrome with diarrhea: Secondary | ICD-10-CM | POA: Diagnosis not present

## 2024-04-16 NOTE — Progress Notes (Signed)
 Established patient visit  Patient: Kristen Chung   DOB: Dec 17, 1985   38 y.o. Female  MRN: [id number] Visit Date: 04/16/2024  Today's healthcare provider: Jolynn Spencer, PA-C   Chief Complaint  Patient presents with   Hypertension    Doing good at home   Subjective     HPI     Hypertension    Additional comments: Doing good at home      Last edited by Wilfred Hargis RAMAN, CMA on 04/16/2024 11:03 AM.       Discussed the use of AI scribe software for clinical note transcription with the patient, who gave verbal consent to proceed.  History of Present Illness Kristen Chung is a 38 year old female with hypertension and depression who presents for medication management and counseling.  She is taking hydrochlorothiazide , valsartan , and Lexapro . She recently increased her Lexapro  dose to 20 mg, taking two pills in the morning and one in the evening, and is concerned about insurance coverage for this increase. Her depression and anxiety are well-managed with Lexapro . She is considering counseling but missed a previous appointment and has not rescheduled.  She and her boyfriend are considering starting a family and are concerned about the impact of her current medications on pregnancy. She has discussed removing her birth control.  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 A stress test was recommended. She currently has no symptoms of blurry or double vision.  She has a history of Crohn's disease and IBS, with symptoms including constipation, abdominal pain, diarrhea, and difficulty keeping food down. Her last significant flare-up was in 2011, and she has a gastroenterology appointment scheduled for later today.  She denies current cocaine use and is working towards complete cessation.  Interim history: pt was seen by cardiology on  03/12/24 For Chest pain, HTN, SOB, smoking and cocaine use,  Was advised to get echo, coronary CT for cad evaluation, complete session of smoking and cocaine use     02/20/2024   11:02 AM 01/17/2024   11:42 AM 08/22/2023    2:20 PM  Depression screen PHQ 2/9  Decreased Interest 0 2 0  Down, Depressed, Hopeless 1 2 0  PHQ - 2 Score 1 4 0  Altered sleeping 3 3 0  Tired, decreased energy 2 3 0  Change in appetite 2 2 0  Feeling bad or failure about yourself  1 2 0  Trouble concentrating 3 2 0  Moving slowly or fidgety/restless 2 2 0  Suicidal thoughts 1 2 0  PHQ-9 Score 15 20 0  Difficult doing work/chores Somewhat difficult        02/20/2024   11:02 AM 01/17/2024   11:43 AM 08/22/2023    2:20 PM  GAD 7 : Generalized Anxiety Score  Nervous, Anxious, on Edge 2 2 0  Control/stop worrying 3 2 0  Worry too much - different things 3 3 0  Trouble relaxing 2 2 0  Restless 2 2 0  Easily annoyed or irritable 3 2 0  Afraid - awful might happen 2 0 0  Total GAD 7 Score 17 13 0  Anxiety Difficulty Somewhat difficult Somewhat difficult     Medications: Outpatient Medications Prior to Visit  Medication Sig   escitalopram  (LEXAPRO ) 10 MG tablet Take 1 tablet (10 mg total) by mouth daily. (Patient taking differently: Take 10 mg  by mouth in the morning and at bedtime.)   Blood Pressure KIT Measure BP at home daily (Patient not taking: Reported on 03/12/2024)   hydrochlorothiazide  (HYDRODIURIL ) 12.5 MG tablet Take 1 tablet (12.5 mg total) by mouth daily.   valsartan  (DIOVAN ) 40 MG tablet Take 1 tablet (40 mg total) by mouth daily.   [DISCONTINUED] metoprolol  tartrate (LOPRESSOR ) 50 MG tablet TAKE 1 TABLET 2 HR PRIOR TO CARDIAC PROCEDURE   No facility-administered medications prior to visit.    Review of Systems All negative Except see HPI       Objective    BP 131/85   Pulse 98   Resp 14   Ht 5' 3 (1.6 m)   Wt 166 lb 12.8 oz (75.7 kg)   LMP 04/13/2024   SpO2 99%   BMI 29.55  kg/m     Physical Exam Vitals reviewed.  Constitutional:      General: She is not in acute distress.    Appearance: Normal appearance. She is well-developed. She is not diaphoretic.  HENT:     Head: Normocephalic and atraumatic.  Eyes:     General: No scleral icterus.    Conjunctiva/sclera: Conjunctivae normal.  Neck:     Thyroid: No thyromegaly.  Cardiovascular:     Rate and Rhythm: Normal rate and regular rhythm.     Pulses: Normal pulses.     Heart sounds: Normal heart sounds. No murmur heard. Pulmonary:     Effort: Pulmonary effort is normal. No respiratory distress.     Breath sounds: Normal breath sounds. No wheezing, rhonchi or rales.  Musculoskeletal:     Cervical back: Neck supple.     Right lower leg: No edema.     Left lower leg: No edema.  Lymphadenopathy:     Cervical: No cervical adenopathy.  Skin:    General: Skin is warm and dry.     Findings: No rash.  Neurological:     Mental Status: She is alert and oriented to person, place, and time. Mental status is at baseline.  Psychiatric:        Mood and Affect: Mood normal.        Behavior: Behavior normal.      No results found for any visits on 04/16/24.      Assessment & Plan Hypertension Chronic stable On hydrochlorothiazide  and valsartan . Goal to reduce medications, especially for potential pregnancy. - Measure blood pressure daily. - Encourage regular exercise and healthy eating. - Aim to reduce antihypertensive medications to one. In the setting of Chest pain, HTN, SOB, smoking and cocaine use  - Await results from cardiology evaluation. Will follow-up  Mood disorder/major depressive disorder and anxiety Chronic On Lexapro  20 mg due to imbalance. Insurance does not cover 20 mg dose. Counseling recommended. - Continue Lexapro  at 20 mg daily. - Coordinate with insurance for medication coverage. - Encourage counseling. Will follow-up  Irritable bowel syndrome Generalized abdominal  pain Chronic Reports bloating, diarrhea, and abdominal pain. Seeing gastroenterologist today. - Attend gastroenterology appointment. Will follow-up  Crohn's disease Last flare-up in 2011. Monitor condition closely. - Discuss symptoms and history with gastroenterologist. - Monitor for flare-up signs.  GERD Chronic Not currently on medication Elevate the head of the bed 6-8 inches, avoid recumbency for 3 hours after eating, avoid food as a delayed gastric emptying, weight loss    Tobacco use Chronic  advised cessation, especially for potential pregnancy. - Encourage complete cessation of smoking.  Cocaine use disorder, in remission Emphasized complete  cessation, especially for potential pregnancy. - Maintain complete cessation of cocaine use.  Hypertension, unspecified type (Primary)  - Hemoglobin A1c - Lipid panel  Other fatigue Chronic, multifactorial Will recheck - Hemoglobin A1c - Lipid panel Will reassess after  receiving lab results   No orders of the defined types were placed in this encounter.   No follow-ups on file.   The patient was advised to call back or seek an in-person evaluation if the symptoms worsen or if the condition fails to improve as anticipated.  I discussed the assessment and treatment plan with the patient. The patient was provided an opportunity to ask questions and all were answered. The patient agreed with the plan and demonstrated an understanding of the instructions.  I, Beacher Every, PA-C have reviewed all documentation for this visit. The documentation on 04/16/2024  for the exam, diagnosis, procedures, and orders are all accurate and complete.  Jolynn Spencer, [facility], MMS [facility] [phone] (phone) [phone] (fax)  [facility]

## 2024-04-17 ENCOUNTER — Ambulatory Visit: Admitting: Physician Assistant

## 2024-04-17 NOTE — Progress Notes (Deleted)
 Ellouise Console, PA-C [address] [place], [place]  [address] Phone: [phone]   Gastroenterology Consultation  Referring Provider:     Ostwalt, Janna, PA-C Primary Care Physician:  Ostwalt, Janna, PA-C Primary Gastroenterologist:  Ellouise Console, PA-C / *** Reason for Consultation:     Constipation, diarrhea, vomiting        HPI:   Kristen Chung is a 38 y.o. y/o female referred for consultation & management  by Ostwalt, Janna, PA-C.    Medical history significant for GERD, irritable bowel syndrome.  History of chronic intermittent diarrhea for many years.  03/2010 colonoscopy done by Dr. Rosalie at [facility]:  Past Medical History:  Diagnosis Date   Allergy    Anemia    Anxiety    BPPV (benign paroxysmal positional vertigo)    Chlamydia    Crohn disease (HCC)    Depression    No specific treatment   Eczema    Esophagitis    Gastritis    Genital warts    Gonorrhea    Hearing loss    Heart murmur    Hiatal hernia    Kidney stone    PID (acute pelvic inflammatory disease)    Pregnancy induced hypertension    Preterm labor    Substance abuse (HCC)    Urinary tract infection    Urticaria     Past Surgical History:  Procedure Laterality Date   abortion     x2    Prior to Admission medications   Medication Sig Start Date End Date Taking? Authorizing Provider  Blood Pressure KIT Measure BP at home daily Patient not taking: Reported on 03/12/2024 08/22/23   Ostwalt, Janna, PA-C  escitalopram  (LEXAPRO ) 10 MG tablet Take 1 tablet (10 mg total) by mouth daily. Patient taking differently: Take 10 mg by mouth in the morning and at bedtime. 02/20/24   Ostwalt, Janna, PA-C  hydrochlorothiazide  (HYDRODIURIL ) 12.5 MG tablet Take 1 tablet (12.5 mg total) by mouth daily. 01/17/24   Ostwalt, Janna, PA-C  valsartan  (DIOVAN ) 40 MG tablet Take 1 tablet (40 mg total) by mouth daily. 02/20/24   Ostwalt, Janna, PA-C    Family History  Problem Relation Age of Onset    Asthma Mother    Heart disease Mother    Diabetes Mother    Hypertension Mother    Cancer Father        Breast   Asthma Sister    Depression Sister    Diabetes Sister    Hypertension Sister    Breast cancer Sister    Diabetes Other    Anesthesia problems Neg Hx      Social History   Tobacco Use   Smoking status: Some Days    Current packs/day: 0.25    Average packs/day: 0.3 packs/day for 6.0 years (1.5 ttl pk-yrs)    Types: Cigarettes   Smokeless tobacco: Never  Vaping Use   Vaping status: Every Day   Substances: Nicotine, Flavoring  Substance Use Topics   Alcohol use: Yes    Alcohol/week: 9.0 - 16.0 standard drinks of alcohol    Types: 4 - 6 Cans of beer, 5 - 10 Shots of liquor per week   Drug use: Yes    Types: Marijuana, Cocaine    Comment: daily    Allergies as of 04/17/2024 - Review Complete 04/16/2024  Allergen Reaction Noted   Celery oil Itching 04/01/2012   Codone [hydrocodone ] Itching 12/24/2011   Dilaudid [employer]  hcl] Itching 08/22/2011   Hydromorphone hcl Itching 08/22/2011   Molds & smuts  04/09/2022   Other  12/04/2019   Strawberry extract Itching 04/01/2012   Tramadol  Itching 01/21/2018    Review of Systems:    All systems reviewed and negative except where noted in HPI.   Physical Exam:  LMP 04/13/2024  Patient's last menstrual period was 04/13/2024.  General:   Alert,  Well-developed, well-nourished, pleasant and cooperative in NAD Lungs:  Respirations even and unlabored.  Clear throughout to auscultation.   No wheezes, crackles, or rhonchi. No acute distress. Heart:  Regular rate and rhythm; no murmurs, clicks, rubs, or gallops. Abdomen:  Normal bowel sounds.  No bruits.  Soft, and non-distended without masses, hepatosplenomegaly or hernias noted.  No Tenderness.  No guarding or rebound tenderness.    Neurologic:  Alert and oriented x3;  grossly normal neurologically. Psych:  Alert and cooperative. Normal mood and  affect.  Imaging Studies: CT CORONARY MORPH W/CTA COR W/SCORE W/CA W/CM &/OR WO/CM Addendum Date: 04/14/2024 ADDENDUM REPORT: 04/14/2024 12:47 EXAM: OVER-READ INTERPRETATION  CT CHEST The following report is an over-read performed by radiologist Dr. Andrea Gasman of [facility] on 04/14/2024. This over-read does not include interpretation of cardiac or coronary anatomy or pathology. The coronary CTA interpretation by the cardiologist is attached. COMPARISON:  None. FINDINGS: Vascular: No aortic atherosclerosis. The included aorta is normal in caliber. Mediastinum/nodes: No adenopathy or mass. Unremarkable esophagus. Lungs: No focal airspace disease. No pulmonary nodule. No pleural fluid. The included airways are patent. Upper abdomen: No acute or unexpected findings. Musculoskeletal: There are no acute or suspicious osseous abnormalities. IMPRESSION: No significant extracardiac findings. Electronically Signed   By: Andrea Gasman M.D.   On: 04/14/2024 12:47   Result Date: 04/14/2024 CLINICAL DATA:  Chest discomfort EXAM: Cardiac/Coronary  CTA TECHNIQUE: The patient was scanned on a Siemens Somatom scanner. : A prospective scan was triggered in the ascending thoracic aorta. Axial non-contrast 3 mm slices were carried out through the heart. The data set was analyzed on a dedicated work station and scored using the Agatson method. Gantry rotation speed was 66 msecs and collimation was .6 mm. 100mg  of metoprolol  and 0.8 mg of sl NTG was given. The 3D data set was reconstructed in 5% intervals of the 35-75 % of the R-R cycle. Diastolic phases were analyzed on a dedicated work station using MPR, MIP, and VRT modes. The patient received 75 cc of contrast. FINDINGS: Image quality: Excellent. Noise artifact is: Limited. Other artifacts: Misregistration. Coronary Arteries: Right dominance. 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 Left main: The left main is a large caliber vessel with a normal  take off from the left coronary cusp that bifurcates to form a left anterior descending artery and a left circumflex artery. There is no plaque or stenosis. Left anterior descending artery: The LAD is patent without evidence of plaque or stenosis. The LAD gives off 1 patent diagonal branch. Left circumflex artery: The LCX is non-dominant and patent with no evidence of plaque or stenosis. The LCX gives off 1 patent obtuse marginal branch. Right coronary artery: The RCA is dominant with anomalous origin from the left coronary cusp. There is a separate ostia with acute takeoff angle and a slit-like, intramural origin. There is an interarterial course above the plane of the pulmonic valve. There is severe stenosis (>70%) in the intramural/interarterial segment. The RCA terminates as a PDA and right posterolateral branch without evidence of plaque or stenosis. Right  Atrium: Right atrial size is within normal limits. Right Ventricle: The right ventricular cavity is within normal limits. Left Atrium: Left atrial size is normal in size with no left atrial appendage filling defect. Left Ventricle: The ventricular cavity size is within normal limits. Pulmonary arteries: Normal in size. Pulmonary veins: Normal pulmonary venous drainage. Pericardium: Normal thickness without significant effusion or calcium present. Cardiac valves: The aortic valve is trileaflet without significant calcification. The mitral valve is normal without significant calcification. Aorta: Normal caliber without significant disease. Extra-cardiac findings: See attached radiology report for non-cardiac structures. IMPRESSION: 1. Coronary calcium score of 0. 2. 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 There is an acute takeoff angle with slit-like, intramural origin and interarterial course. There is severe stenosis of the intramural/interarterial segment. RECOMMENDATIONS: CAD-RADS 4:  Severe stenosis. (70-99% or > 50% left main). Cardiac catheterization or CT FFR is recommended. Consider symptom-guided anti-ischemic pharmacotherapy as well as risk factor modification per guideline directed care. Consider invasive coronary angiography or functional testing as indicated per published guideline statements. Additional analysis with CT-FFR will be submitted and reported separately. Caron Poser, MD Electronically Signed: By: Caron Poser On: 04/06/2024 14:09   CT CORONARY FRACTIONAL FLOW RESERVE FLUID ANALYSIS Result Date: 04/06/2024 EXAM: CT FFR analysis was performed on the original cardiac CTA dataset. Diagrammatic representation of the CT FFR analysis is provided in a separate PDF document in PACS. This dictation was created using the PDF document and an interactive 3D model of the results. The 3D model is not available in the EMR/PACS. INTERPRETATION: CT FFR provides simultaneous calculation of pressure and flow across the entire coronary tree. For clinical decision making, CT FFR values should be obtained 1-2 cm distal to the lower border of each stenosis measured. Coronary CTA-related artifacts may impair the diagnostic accuracy of the original cardiac CTA and FFR CT results. *Due to the fact that CT FFR represents a mathematically-derived analysis, it is recommended that the results be interpreted as follows: 1. CT FFR >0.80: Low likelihood of hemodynamic significance. 2. CT FFR 0.76-0.80: Borderline likelihood of hemodynamic significance. 3. CT FFR =< 0.75: High likelihood of hemodynamic significance. *Coronary CT Angiography-derived Fractional Flow Reserve Testing in Patients with Stable Coronary Artery Disease: Recommendations on Interpretation and Reporting. Radiology: Cardiothoracic Imaging. 2019;1(5):e190050 FINDINGS: 1. RCA: CT FFR of 0.75 indicating high likelihood of hemodynamic significance. IMPRESSION: 1. CT FFR analysis suggests probable hemodynamic significance of the  anomalous RCA. Caron Poser, MD Electronically Signed   By: Caron Poser   On: 04/06/2024 14:10    Labs: CBC    Component Value Date/Time   WBC 4.6 01/17/2024 1121   WBC 4.8 10/21/2022 1710   RBC 4.53 01/17/2024 1121   RBC 4.06 10/21/2022 1710   HGB 12.7 01/17/2024 1121   HCT 39.9 01/17/2024 1121   PLT 280 01/17/2024 1121   MCV 88 01/17/2024 1121    CMP     Component Value Date/Time   NA 139 03/12/2024 1016   K 4.1 03/12/2024 1016   CL 105 03/12/2024 1016   CO2 19 (L) 03/12/2024 1016   GLUCOSE 81 03/12/2024 1016   GLUCOSE 131 (H) 10/21/2022 1710   BUN 10 03/12/2024 1016   CREATININE 0.75 03/12/2024 1016   CALCIUM 8.8 03/12/2024 1016   PROT 6.9 01/17/2024 1121   ALBUMIN 4.3 01/17/2024 1121   AST 13 01/17/2024 1121   ALT 9 01/17/2024 1121   ALKPHOS 47 01/17/2024 1121  BILITOT <0.2 01/17/2024 1121   GFRNONAA >60 10/21/2022 1710   GFRAA >60 04/20/2020 1215    Assessment and Plan:   Kristen Chung is a 38 y.o. y/o female has been referred for ***  Follow up ***  Ellouise Console, PA-C

## 2024-04-21 ENCOUNTER — Other Ambulatory Visit: Payer: Self-pay | Admitting: Cardiology

## 2024-04-21 ENCOUNTER — Ambulatory Visit: Attending: Cardiology

## 2024-04-21 DIAGNOSIS — R0602 Shortness of breath: Secondary | ICD-10-CM | POA: Diagnosis present

## 2024-04-21 DIAGNOSIS — R072 Precordial pain: Secondary | ICD-10-CM

## 2024-04-21 DIAGNOSIS — F17200 Nicotine dependence, unspecified, uncomplicated: Secondary | ICD-10-CM | POA: Insufficient documentation

## 2024-04-21 DIAGNOSIS — I10 Essential (primary) hypertension: Secondary | ICD-10-CM

## 2024-04-21 DIAGNOSIS — R079 Chest pain, unspecified: Secondary | ICD-10-CM | POA: Diagnosis not present

## 2024-04-21 LAB — ECHOCARDIOGRAM COMPLETE
AR max vel: 2.4 cm2
AV Area VTI: 2.29 cm2
AV Area mean vel: 2.32 cm2
AV Mean grad: 4 mmHg
AV Peak grad: 7.5 mmHg
Ao pk vel: 1.37 m/s
Area-P 1/2: 3.65 cm2
S' Lateral: 2.32 cm

## 2024-05-05 ENCOUNTER — Ambulatory Visit: Admission: RE | Admit: 2024-05-05 | Source: Ambulatory Visit

## 2024-05-12 ENCOUNTER — Other Ambulatory Visit: Payer: Self-pay | Admitting: Physician Assistant

## 2024-05-12 ENCOUNTER — Other Ambulatory Visit: Payer: Self-pay | Admitting: Licensed Practical Nurse

## 2024-05-12 DIAGNOSIS — B9689 Other specified bacterial agents as the cause of diseases classified elsewhere: Secondary | ICD-10-CM

## 2024-05-15 ENCOUNTER — Telehealth: Payer: Self-pay

## 2024-05-15 DIAGNOSIS — A159 Respiratory tuberculosis unspecified: Secondary | ICD-10-CM

## 2024-05-15 NOTE — Telephone Encounter (Signed)
 Copied from CRM [id number]. Topic: General - Other >> May 15, 2024  3:14 PM Delon DASEN wrote: Reason for CRM: Patient asking if she is up to date on TB, [phone]

## 2024-05-24 LAB — HEMOGLOBIN A1C
Est. average glucose Bld gHb Est-mCnc: 111 mg/dL
Hgb A1c MFr Bld: 5.5 % (ref 4.8–5.6)

## 2024-05-24 LAB — LIPID PANEL
Chol/HDL Ratio: 2.3 ratio (ref 0.0–4.4)
Cholesterol, Total: 157 mg/dL (ref 100–199)
HDL: 68 mg/dL (ref >39)
LDL Chol Calc (NIH): 76 mg/dL (ref 0–99)
Triglycerides: 63 mg/dL (ref 0–149)
VLDL Cholesterol Cal: 13 mg/dL (ref 5–40)

## 2024-05-24 LAB — QUANTIFERON-TB GOLD PLUS
QuantiFERON Mitogen Value: >10 [IU]/mL
QuantiFERON Nil Value: 0.06 [IU]/mL
QuantiFERON TB1 Ag Value: 0.06 [IU]/mL
QuantiFERON TB2 Ag Value: 0.07 [IU]/mL

## 2024-05-25 ENCOUNTER — Ambulatory Visit: Payer: Self-pay | Admitting: Physician Assistant

## 2024-05-25 ENCOUNTER — Ambulatory Visit: Admitting: Licensed Practical Nurse

## 2024-05-25 ENCOUNTER — Encounter: Payer: Self-pay | Admitting: Licensed Practical Nurse

## 2024-05-25 VITALS — BP 146/88 | HR 78 | Ht 63.0 in | Wt 170.2 lb

## 2024-05-25 DIAGNOSIS — Z30432 Encounter for removal of intrauterine contraceptive device: Secondary | ICD-10-CM | POA: Diagnosis not present

## 2024-05-25 DIAGNOSIS — Z3169 Encounter for other general counseling and advice on procreation: Secondary | ICD-10-CM

## 2024-05-25 NOTE — Progress Notes (Signed)
   SUBJECTIVE: Here for IUD remove, desires pregnancy. Has a 58 and 37 y/o at home.  OB HX: PPROM at 6 months then gave birth at 7 months, cerclage with second pregnancy  Planning to stop tobacco, alcohol and MJ  Depression: on Lexapro , seems to be helping  CHTN: has not taken her medication yet today, has been seen by her PCP and has had well controlled blood pressures. She does have a cuff at home  Has a partner, he has other children   OBJECTIVE: BP (!) 146/88 (BP Location: Right Arm, Patient Position: Sitting, Cuff Size: Normal)   Pulse 78   Ht 5' 3 (1.6 m)   Wt 170 lb 3.2 oz (77.2 kg)   BMI 30.15 kg/m   BEN: NAD   GYNECOLOGY OFFICE PROCEDURE NOTE  Kristen Chung is a 39 y.o. [phone] here for Liletta  IUD removal. No GYN concerns.  Last pap smear was on 08/2020 and was normal.  IUD Removal  Patient identified, informed consent performed, consent signed.  Patient was in the dorsal lithotomy position, normal external genitalia was noted.  A speculum was placed in the patient's vagina, normal discharge was noted, no lesions. The cervix was visualized, no lesions, no abnormal discharge.  The strings of the IUD were grasped and pulled using ring forceps. The IUD was removed in its entirety. Patient tolerated the procedure well.    Patient will use plans for pregnancy soon and she was told to avoid teratogens, take PNV and folic acid.  Routine preventative health maintenance measures emphasized.   Will return for Rubella and Varicella titers    Jinnie Cookey, CNM  [facility] 05/25/24  4:39 PM

## 2024-05-26 ENCOUNTER — Other Ambulatory Visit

## 2024-05-27 ENCOUNTER — Other Ambulatory Visit

## 2024-05-27 ENCOUNTER — Telehealth: Payer: Self-pay

## 2024-05-27 LAB — VARICELLA ZOSTER ANTIBODY, IGG: Varicella zoster IgG: REACTIVE

## 2024-05-27 LAB — RUBELLA SCREEN: Rubella Antibodies, IGG: 2.06 {index} (ref >0.99)

## 2024-05-27 NOTE — Telephone Encounter (Signed)
 Copied from CRM [id number]. Topic: Clinical - Lab/Test Results >> May 27, 2024 11:16 AM Larissa RAMAN wrote: Reason for CRM: Patient requesting a copy of lab results. Please call once available for pickup.

## 2024-05-27 NOTE — Telephone Encounter (Signed)
 LM -labs printed and placed up front ready for pick up.

## 2024-06-08 NOTE — Progress Notes (Unsigned)
 Ellouise Console, PA-C [address] [place], [place]  [address] Phone: [phone]   Gastroenterology Consultation  Referring Provider:     Ostwalt, Janna, PA-C Primary Care Physician:  Ostwalt, Janna, PA-C Primary Gastroenterologist:  Ellouise Console, PA-C / Sandor Flatter, MD  Reason for Consultation:     Constipation, diarrhea, vomiting        HPI:   Discussed the use of AI scribe software for clinical note transcription with the patient, who gave verbal consent to proceed. History of Present Illness Kristen Chung is a 38 year old female with a history of gastrointestinal issues who presents with recurrent gastrointestinal symptoms.  She has experienced gastrointestinal symptoms since 2011, including a significant episode requiring a 7 to 11 day hospital stay in 2011 for nausea, vomiting, and diarrhea. During that hospitalization, she underwent a colonoscopy and endoscopy but is uncertain if a diagnosis of Crohn's disease was made.  Reports are not available.  Over the past six to seven months, she has experienced nausea, vomiting, diarrhea, constipation, and rectal urgency. She describes episodes where food is either regurgitated after burping or passes quickly through her system. She also experiences periods of constipation despite using techniques to aid bowel movements.  She reports burning pain in the epigastric and left upper quadrant areas, as well as pelvic pain resembling labor pain, characterized by a sensation of needing to defecate without success. Nausea has persisted throughout the past week, with fluctuating bowel habits including constipation and diarrhea.  She has noticed occasional blood in her stool, occurring once or twice in the past three months, but not within the last month. Her weight has been fluctuating.  She has not undergone a repeat colonoscopy or upper endoscopy since the initial workup in 2011. A CT abdomen pelvis in 2024 showed normal intestinal  findings. Recent blood work indicated normal hemoglobin, white count, and kidney and liver tests.  She occasionally takes probiotics, which sometimes alleviate diarrhea, and uses stool softeners for constipation. She has not had any abdominal or pelvic surgeries aside from an IUD placement.  09/2022 CT abdomen pelvis without contrast: 1. No abnormality seen to explain the clinical presentation. No evidence of urinary tract stone disease or other urinary tract pathology. 2. No significant bowel finding. Normal appendix. The stomach is full of ingested material.  Past Medical History:  Diagnosis Date   Allergy    Anemia    Anxiety    BPPV (benign paroxysmal positional vertigo)    Chlamydia    Crohn disease (HCC)    Depression    No specific treatment   Eczema    Esophagitis    Gastritis    Genital warts    Gonorrhea    Hearing loss    Heart murmur    Hiatal hernia    Kidney stone    PID (acute pelvic inflammatory disease)    Pregnancy induced hypertension    Preterm labor    Substance abuse (HCC)    Urinary tract infection    Urticaria     Past Surgical History:  Procedure Laterality Date   abortion     x2    Prior to Admission medications   Medication Sig Start Date End Date Taking? Authorizing Provider  Blood Pressure KIT Measure BP at home daily Patient not taking: Reported on 03/12/2024 08/22/23   Ostwalt, Janna, PA-C  escitalopram  (LEXAPRO ) 10 MG tablet Take 1 tablet (10 mg total) by mouth daily. Patient taking differently: Take 10  mg by mouth in the morning and at bedtime. 02/20/24   Ostwalt, Janna, PA-C  hydrochlorothiazide  (HYDRODIURIL ) 12.5 MG tablet Take 1 tablet (12.5 mg total) by mouth daily. 01/17/24   Ostwalt, Janna, PA-C  valsartan  (DIOVAN ) 40 MG tablet Take 1 tablet (40 mg total) by mouth daily. 02/20/24   Ostwalt, Janna, PA-C    Family History  Problem Relation Age of Onset   Asthma Mother    Heart disease Mother    Diabetes Mother    Hypertension  Mother    Cancer Father        Breast   Asthma Sister    Depression Sister    Diabetes Sister    Hypertension Sister    Breast cancer Sister    Diabetes Other    Anesthesia problems Neg Hx      Social History   Tobacco Use   Smoking status: Some Days    Current packs/day: 0.25    Average packs/day: 0.3 packs/day for 6.0 years (1.5 ttl pk-yrs)    Types: Cigarettes   Smokeless tobacco: Never  Vaping Use   Vaping status: Every Day   Substances: Nicotine, Flavoring  Substance Use Topics   Alcohol use: Yes    Alcohol/week: 9.0 - 16.0 standard drinks of alcohol    Types: 4 - 6 Cans of beer, 5 - 10 Shots of liquor per week   Drug use: Yes    Types: Marijuana, Cocaine    Comment: daily    Allergies as of 06/09/2024 - Review Complete 06/09/2024  Allergen Reaction Noted   Celery oil Itching 04/01/2012   Codone [hydrocodone ] Itching 12/24/2011   Dilaudid [hydromorphone hcl] Itching 08/22/2011   Hydromorphone hcl Itching 08/22/2011   Molds & smuts  04/09/2022   Other  12/04/2019   Strawberry extract Itching 04/01/2012   Tramadol  Itching 01/21/2018    Review of Systems:    All systems reviewed and negative except where noted in HPI.   Physical Exam:  BP (!) 130/90   Pulse 72   Ht 5' 2 (1.575 m)   Wt 169 lb (76.7 kg)   BMI 30.91 kg/m  No LMP recorded.  General:   Alert,  Well-developed, well-nourished, pleasant and cooperative in NAD Lungs:  Respirations even and unlabored.  Clear throughout to auscultation.   No wheezes, crackles, or rhonchi. No acute distress. Heart:  Regular rate and rhythm; no murmurs, clicks, rubs, or gallops. Abdomen:  Normal bowel sounds.  No bruits.  Soft, and non-distended without masses, hepatosplenomegaly or hernias noted.  Mild epigastric and bilateral lower abdominal tenderness.  No guarding or rebound tenderness.    Neurologic:  Alert and oriented x3;  grossly normal neurologically. Psych:  Alert and cooperative.  Anxious mood and  affect.   Imaging Studies: No results found.  Labs: CBC    Component Value Date/Time   WBC 4.6 01/17/2024 1121   WBC 4.8 10/21/2022 1710   RBC 4.53 01/17/2024 1121   RBC 4.06 10/21/2022 1710   HGB 12.7 01/17/2024 1121   HCT 39.9 01/17/2024 1121   PLT 280 01/17/2024 1121   MCV 88 01/17/2024 1121    CMP     Component Value Date/Time   NA 139 03/12/2024 1016   K 4.1 03/12/2024 1016   CL 105 03/12/2024 1016   CO2 19 (L) 03/12/2024 1016   GLUCOSE 81 03/12/2024 1016   GLUCOSE 131 (H) 10/21/2022 1710   BUN 10 03/12/2024 1016   CREATININE 0.75 03/12/2024 1016   CALCIUM  8.8 03/12/2024 1016   PROT 6.9 01/17/2024 1121   ALBUMIN 4.3 01/17/2024 1121   AST 13 01/17/2024 1121   ALT 9 01/17/2024 1121   ALKPHOS 47 01/17/2024 1121   BILITOT <0.2 01/17/2024 1121   GFRNONAA >60 10/21/2022 1710   GFRAA >60 04/20/2020 1215    Assessment and Plan:   Rheannon L Birchard is a 38 y.o. y/o female has been referred for:  1.  Irritable bowel syndrome with diarrhea, constipation, and abdominal pain - Low FODMAP diet - Take OTC MiraLAX 1 capful in a drink daily as needed for constipation. - If MiraLAX does not work, then try Senokot or Colace stool softener.  2.  Questionable history of Crohn's colitis seen on flexible sigmoidoscopy in 2011.  She has not had confirmed diagnosis of Crohn's disease.  Last abdominal pelvic CT in 2024 showed no bowel inflammation.  She has not been on any Crohn's treatment in the past 10 years. - Fecal calprotectin - Scheduling Colonoscopy I discussed risks of colonoscopy with patient to include risk of bleeding, colon perforation, and risk of sedation.  Patient expressed understanding and agrees to proceed with colonoscopy.   3. Nausea, Vomiting, Epig pain, GERD - Scheduling EGD I discussed risks of EGD with patient to include risk of bleeding, perforation, and risk of sedation.  Patient expressed understanding and agrees to proceed with EGD.  - Take OTC Prilosec  20 mg once daily or Pepcid  20 mg twice daily. - Recommend Lifestyle Modifications to prevent Acid Reflux.  Rec. Avoid coffee, sodas, peppermint, garlic, onions, alcohol, citrus fruits, chocolate, tomatoes, fatty and spicey foods.  Avoid eating 2-3 hours before bedtime.     Follow up 4 weeks after EGD and colonoscopy.  Ellouise Console, PA-C

## 2024-06-09 ENCOUNTER — Ambulatory Visit: Admitting: Physician Assistant

## 2024-06-09 ENCOUNTER — Encounter: Payer: Self-pay | Admitting: Physician Assistant

## 2024-06-09 VITALS — BP 130/90 | HR 72 | Ht 62.0 in | Wt 169.0 lb

## 2024-06-09 DIAGNOSIS — Z8719 Personal history of other diseases of the digestive system: Secondary | ICD-10-CM | POA: Diagnosis not present

## 2024-06-09 DIAGNOSIS — K625 Hemorrhage of anus and rectum: Secondary | ICD-10-CM

## 2024-06-09 DIAGNOSIS — R197 Diarrhea, unspecified: Secondary | ICD-10-CM

## 2024-06-09 DIAGNOSIS — R1084 Generalized abdominal pain: Secondary | ICD-10-CM

## 2024-06-09 DIAGNOSIS — R1013 Epigastric pain: Secondary | ICD-10-CM

## 2024-06-09 DIAGNOSIS — K5900 Constipation, unspecified: Secondary | ICD-10-CM | POA: Diagnosis not present

## 2024-06-09 DIAGNOSIS — R112 Nausea with vomiting, unspecified: Secondary | ICD-10-CM

## 2024-06-09 NOTE — Patient Instructions (Addendum)
 Recommend Lifestyle Modifications to prevent Acid Reflux.  Rec. Avoid coffee, sodas, peppermint, garlic, onions, alcohol, citrus fruits, chocolate, tomatoes, fatty and spicey foods.  Avoid eating 2-3 hours before bedtime.    You can take OTC Prilosec 20mg  1 tablet once daily and / or OTC Pepcid  (famotidine ) 20mg  1 tablet twice daily as needed for Acid Reflux.  For Constipation, you can take OTC Miralax powder 1 capful in a drink, OTC Senokot S 2 tablets once daily, or Colace Stool softener.  Please follow up sooner if symptoms increase or worsen  Due to recent changes in healthcare laws, you may see the results of your imaging and laboratory studies on MyChart before your provider has had a chance to review them.  We understand that in some cases there may be results that are confusing or concerning to you. Not all laboratory results come back in the same time frame and the provider may be waiting for multiple results in order to interpret others.  Please give us  48 hours in order for your provider to thoroughly review all the results before contacting the office for clarification of your results.   Thank you for trusting me with your gastrointestinal care!   Ellouise Console, PA-C _______________________________________________________  If your blood pressure at your visit was 140/90 or greater, please contact your primary care physician to follow up on this.  _______________________________________________________  If you are age 4 or older, your body mass index should be between 23-30. Your Body mass index is 30.91 kg/m. If this is out of the aforementioned range listed, please consider follow up with your Primary Care Provider.  If you are age 69 or younger, your body mass index should be between 19-25. Your Body mass index is 30.91 kg/m. If this is out of the aformentioned range listed, please consider follow up with your Primary Care Provider.    ________________________________________________________  The [facility] providers would like to encourage you to use MYCHART to communicate with providers for non-urgent requests or questions.  Due to long hold times on the telephone, sending your provider a message by [facility] may be a faster and more efficient way to get a response.  Please allow 48 business hours for a response.  Please remember that this is for non-urgent requests.  _______________________________________________________

## 2024-06-12 ENCOUNTER — Ambulatory Visit: Admitting: Cardiology

## 2024-06-12 NOTE — Progress Notes (Deleted)
 Cardiology Office Note   Date:  06/12/2024  ID:  Kristen Chung, DOB August 10, 1985, MRN [id number] PCP: Dineen Channel, PA-C  [facility] Providers Cardiologist:  None { Click to update primary MD,subspecialty MD or APP then REFRESH:1}    History of Present Illness Kristen Chung is a 38 y.o. female with a past medical history of hypertension, current smoker x 20+ years, history of cocaine use who was recently evaluated for chest pain, who is here today for follow-up.  She was last seen in clinic 03/12/2024 by Dr.Agbor-Etang.  At that time she was a new patient complaining of uncontrolled blood pressure and weekly chest discomfort, shortness of breath, palpitations, and fatigue.  She endorsed using cocaine with the last use approximately 2 weeks prior to her appointment.  She is working on cutting back on smoking and also drug use due to her plans on getting pregnant in the near future.  She stated that her mother had a history of congestive heart failure and also had a defibrillator.  Blood pressure was controlled on losartan and HCTZ as prescribed.  She was scheduled for an echocardiogram and a coronary CTA to evaluate coronary artery disease.  She was to follow-up after cardiac testing was completed.  ROS: ***  Studies Reviewed     2d echo 04/21/2024 1. Left ventricular ejection fraction, by estimation, is 65 to 70%. The  left ventricle has normal function. The left ventricle has no regional  wall motion abnormalities. Left ventricular diastolic parameters were  normal. The average left ventricular  global longitudinal strain is -18.9 %. The global longitudinal strain is  normal.   2. Right ventricular systolic function is normal. The right ventricular  size is normal. Tricuspid regurgitation signal is inadequate for assessing  PA pressure.   Comparison(s): No prior Echocardiogram.   Conclusion(s)/Recommendation(s): Normal biventricular function without  evidence of  hemodynamically significant valvular heart disease.   FFR cCTA 04/06/2024 FINDINGS: 1. RCA: CT FFR of 0.75 indicating high likelihood of hemodynamic significance.   IMPRESSION:   1. 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  cCTA 04/06/2024 IMPRESSION: 1. Coronary calcium score of 0.   2. 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 There is an acute takeoff angle with slit-like, intramural origin and interarterial course. There is severe stenosis of the intramural/interarterial segment.   RECOMMENDATIONS: CAD-RADS 4: Severe stenosis. (70-99% or > 50% left main). Cardiac catheterization or CT FFR is recommended. Consider symptom-guided anti-ischemic pharmacotherapy as well as risk factor modification per guideline directed care. Consider invasive coronary angiography or functional testing as indicated per published guideline statements.   Additional analysis with CT-FFR will be submitted and reported separately.   Risk Assessment/Calculations {Does this patient have ATRIAL FIBRILLATION?:[id number]} No BP recorded.  {Refresh Note OR Click here to enter BP  :1}***       Physical Exam VS:  There were no vitals taken for this visit.       Wt Readings from Last 3 Encounters:  06/09/24 169 lb (76.7 kg)  05/25/24 170 lb 3.2 oz (77.2 kg)  04/16/24 166 lb 12.8 oz (75.7 kg)    GEN: Well nourished, well developed in no acute distress NECK: No JVD; No carotid bruits CARDIAC: ***RRR, no murmurs, rubs, gallops RESPIRATORY:  Clear to auscultation without rales, wheezing or rhonchi  ABDOMEN: Soft, non-tender, non-distended EXTREMITIES:  No edema; No deformity   ASSESSMENT AND PLAN ***    {  Are you ordering a CV Procedure (e.g. stress test, cath, DCCV, TEE, etc)?   Press F2        :[id number]}  Dispo: ***  Signed, Elric Tirado, NP

## 2024-06-14 NOTE — Progress Notes (Signed)
 Agree with the assessment and plan as outlined by Brigitte Canard, PA-C.  Leidy Massar, DO, [facility]

## 2024-06-22 ENCOUNTER — Telehealth: Payer: Self-pay | Admitting: Physician Assistant

## 2024-06-22 MED ORDER — NA SULFATE-K SULFATE-MG SULF 17.5-3.13-1.6 GM/177ML PO SOLN: 1.0000 | Freq: Once | ORAL | 0 refills | Status: AC

## 2024-06-22 NOTE — Telephone Encounter (Signed)
 Inbound call from patient stating that she has not received her prep medication for her ENDO COLON scheduled for January the 6 th. Patient is requesting a call back to be informed of when the medication was sent. Please advise.

## 2024-06-22 NOTE — Addendum Note (Signed)
 Addended by: LANETTE KIRSCHNER L on: 06/22/2024 04:00 PM   Modules accepted: Orders

## 2024-06-29 NOTE — Telephone Encounter (Signed)
 Patient called stating Suprep is not covered with insurance. Requesting a alternative prep. Please advise, thank you

## 2024-07-01 ENCOUNTER — Ambulatory Visit: Attending: Cardiology

## 2024-07-01 DIAGNOSIS — R931 Abnormal findings on diagnostic imaging of heart and coronary circulation: Secondary | ICD-10-CM

## 2024-07-06 ENCOUNTER — Ambulatory Visit: Admitting: Physician Assistant

## 2024-07-06 ENCOUNTER — Telehealth: Admitting: Physician Assistant

## 2024-07-06 DIAGNOSIS — F1410 Cocaine abuse, uncomplicated: Secondary | ICD-10-CM

## 2024-07-06 DIAGNOSIS — Z8719 Personal history of other diseases of the digestive system: Secondary | ICD-10-CM

## 2024-07-06 DIAGNOSIS — K219 Gastro-esophageal reflux disease without esophagitis: Secondary | ICD-10-CM

## 2024-07-06 DIAGNOSIS — I10 Essential (primary) hypertension: Secondary | ICD-10-CM

## 2024-07-06 DIAGNOSIS — F1090 Alcohol use, unspecified, uncomplicated: Secondary | ICD-10-CM

## 2024-07-06 DIAGNOSIS — F39 Unspecified mood [affective] disorder: Secondary | ICD-10-CM

## 2024-07-06 DIAGNOSIS — F418 Other specified anxiety disorders: Secondary | ICD-10-CM

## 2024-07-06 DIAGNOSIS — K580 Irritable bowel syndrome with diarrhea: Secondary | ICD-10-CM

## 2024-07-06 DIAGNOSIS — F1290 Cannabis use, unspecified, uncomplicated: Secondary | ICD-10-CM

## 2024-07-06 DIAGNOSIS — F329 Major depressive disorder, single episode, unspecified: Secondary | ICD-10-CM

## 2024-07-06 MED ORDER — HYDROCHLOROTHIAZIDE 12.5 MG PO TABS: 12.5000 mg | ORAL_TABLET | Freq: Every day | ORAL | 1 refills | Status: AC

## 2024-07-06 MED ORDER — ESCITALOPRAM OXALATE 10 MG PO TABS: 10.0000 mg | ORAL_TABLET | Freq: Two times a day (BID) | ORAL | 1 refills | Status: AC

## 2024-07-06 MED ORDER — VALSARTAN 40 MG PO TABS: 40.0000 mg | ORAL_TABLET | Freq: Every day | ORAL | 1 refills | Status: AC

## 2024-07-27 ENCOUNTER — Telehealth: Payer: Self-pay | Admitting: Gastroenterology

## 2024-07-27 NOTE — Telephone Encounter (Signed)
 PT has rescheduled procedure from 1/6 to 1/27. f

## 2024-07-27 NOTE — Telephone Encounter (Signed)
 PT wants to have new instruction mailed to her. Please advise.

## 2024-07-28 ENCOUNTER — Encounter: Admitting: Gastroenterology

## 2024-07-30 NOTE — Telephone Encounter (Signed)
 I have sent new instructions through MyChart I will call the patient.

## 2024-08-03 ENCOUNTER — Ambulatory Visit: Payer: Self-pay | Admitting: Licensed Clinical Social Worker

## 2024-08-03 DIAGNOSIS — F411 Generalized anxiety disorder: Secondary | ICD-10-CM

## 2024-08-03 DIAGNOSIS — F331 Major depressive disorder, recurrent, moderate: Secondary | ICD-10-CM
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
## 2024-08-04 NOTE — Patient Instructions (Signed)
 Using Behavioral Activation to manage stress/depression symptoms     Identify/understand your own mood triggers.   Structure your day--get up around the same time, eat meals/snacks around the same time, go to bed around the same time.   Purposefully schedule self care time and time to complete tasks. This can include quiet time.  Stimulate your brain--go for a walk, text/call a friend or family member, if you are indoors--go outside (and vice versa), go for a drive, go to a store with bright colors and bright lights. Try to do things in a different way--drive to your favorite places using an alternative route, or instead of starting on the right side of the grocery store when shopping, start on the left side. You might feel a bit uncomfortable doing things outside of the comfort zone, but this is helping the brain create new neural pathways and is very healthy for brain/emotional health.   Physical movement based on your ability. If you can go for a walk, do stretches, even waving your hands to music can trigger feel-good endorphins in the brain and help release physical tension we all hold in our bodies.  Even 5 minutes can make a difference.   Be intentional about doing things that bring you joy (or used to bring you joy), and look for the things in every day that make you happy.  Seek those glimmers of joy each day.  Set a timer for 5 minutes for a harder task (ex. Laundry, washing dishes).  Allow yourself to work distraction-free for 5 minutes, then stop when the timer goes off. If you need a break, take a break. If you want to continue working then set another timer for whatever time you choose.   Limit or eliminate substance use including alcohol, marijuana, or recreational use of prescription medication.  Let in the light!! Open the window blinds, curtains and let natural light in. Even sitting near a window or sitting outside can boost your mood, especially in the wintertime when  there is less daylight.   If you take medication to manage symptoms, remember to take all medications as they are prescribed (please read all labels!!)    Things to envision for ourselves to to improve inspiration, motivation, and initiative :  improving physical wellness, focus on family relationships, focusing on our own mental/emotional well being, being a part of a bigger community, finding a hobby, being a part of something that fosters personal growth, engaging socially with others (even digitally!!!)    Emergency Resources:  National Suicide & Crisis Lifeline: Call or text 988  Crisis Text Line: Text HOME to 478-823-2979  [facility]  [address], [place], [place] [address] [phone] or [phone] WALK-IN URGENT CARE 24/7 FOR ANYONE [address], [place], [place]  [phone] Fax: [phone] [email or web address] *Interpreters available *Accepts all insurance and uninsured for Urgent Care needs *Accepts Medicaid and uninsured for outpatient treatment (below)

## 2024-08-05 ENCOUNTER — Telehealth (INDEPENDENT_AMBULATORY_CARE_PROVIDER_SITE_OTHER): Payer: Self-pay | Admitting: Licensed Clinical Social Worker

## 2024-08-05 DIAGNOSIS — F331 Major depressive disorder, recurrent, moderate: Secondary | ICD-10-CM | POA: Diagnosis not present

## 2024-08-05 DIAGNOSIS — F411 Generalized anxiety disorder: Secondary | ICD-10-CM

## 2024-08-06 ENCOUNTER — Telehealth: Payer: Self-pay | Admitting: Gastroenterology

## 2024-08-06 DIAGNOSIS — R197 Diarrhea, unspecified: Secondary | ICD-10-CM

## 2024-08-06 DIAGNOSIS — Z8719 Personal history of other diseases of the digestive system: Secondary | ICD-10-CM

## 2024-08-06 NOTE — Progress Notes (Signed)
" ° ° °  NURSE VISIT NOTE  Subjective:    Patient ID: Kristen Chung, female    DOB: Feb 02, 1986, 39 y.o.   MRN: [id number]  HPI  Patient is a 39 y.o. [phone] female who presents for evaluation of amenorrhea. She believes she could be pregnant. Pregnancy is desired. Sexual Activity: single partner, contraception: none. Current symptoms also include: breast tenderness, fatigue, frequent urination, nausea, and positive home pregnancy test. Last period was normal.    Objective:    BP 139/80   Pulse 82   Ht 5' 3 (1.6 m)   Wt 162 lb 12.8 oz (73.8 kg)   LMP 06/28/2024 (Exact Date)   BMI 28.84 kg/m   Lab Review  Results for orders placed or performed in visit on 08/07/24  POCT urine pregnancy  Result Value Ref Range   Preg Test, Ur Positive (A) Negative    Assessment:   1. Amenorrhea     Plan:   Pregnancy Test: Positive  Patient plans to deliver at the [facility], will be getting her OB in [place].    Mathis LITTIE Getting, CMA   "
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
## 2024-08-06 NOTE — Telephone Encounter (Signed)
 Pt made aware of PA recommendations & advised on when/where to go for stool kit. Procedure has been cancelled.

## 2024-08-06 NOTE — Telephone Encounter (Signed)
 Returned call to patient & she has her first OB appt on Friday. Recently found out she is pregnant (around 4-5 weeks or so) based on LMP. She is scheduled for endo colon on 1/27 & would like to know if this can still be done or if it needs to be rescheduled. Pt last seen with [place], [place] for OV 06/09/24.

## 2024-08-06 NOTE — Telephone Encounter (Signed)
 Inbound call from patient stating she would like to notify the nurse that she recently found out she was pregnant and would like to be advised if she can continue with her double procedure she has on 08/18/24 due to the anesthesia. Requesting a call back  Please advise  Thank you

## 2024-08-07 ENCOUNTER — Ambulatory Visit (INDEPENDENT_AMBULATORY_CARE_PROVIDER_SITE_OTHER)

## 2024-08-07 VITALS — BP 139/80 | HR 82 | Ht 63.0 in | Wt 162.8 lb

## 2024-08-07 DIAGNOSIS — Z3687 Encounter for antenatal screening for uncertain dates: Secondary | ICD-10-CM

## 2024-08-07 DIAGNOSIS — Z3201 Encounter for pregnancy test, result positive: Secondary | ICD-10-CM | POA: Diagnosis not present

## 2024-08-07 DIAGNOSIS — N912 Amenorrhea, unspecified: Secondary | ICD-10-CM | POA: Diagnosis not present

## 2024-08-07 LAB — POCT URINE PREGNANCY: Preg Test, Ur: POSITIVE — AB

## 2024-08-07 NOTE — Patient Instructions (Addendum)
 First Trimester of Pregnancy  The first trimester of pregnancy starts on the first day of your last monthly period until the end of week 13. This is months 1 through 3 of pregnancy. A week after a sperm fertilizes an egg, the egg will implant into the wall of the uterus and begin to develop into a baby. Body changes during your first trimester Your body goes through many changes during pregnancy. The changes usually return to normal after your baby is born. Physical changes Your breasts may grow larger and may hurt. The area around your nipples may get darker. Your periods will stop. Your hair and nails may grow faster. You may pee more often. Health changes You may tire easily. Your gums may bleed and may be sensitive when you brush and floss. You may not feel hungry. You may have heartburn. You may throw up or feel like you may throw up. You may want to eat some foods, but not others. You may have headaches. You may have trouble pooping (constipation). Other changes Your emotions may change from day to day. You may have more dreams. Follow these instructions at home: Medicines Talk to your health care provider if you're taking medicines. Ask if the medicines are safe to take during pregnancy. Your provider may change the medicines that you take. Do not take any medicines unless told to by your provider. Take a prenatal vitamin that has at least 600 micrograms (mcg) of folic acid. Do not use herbal medicines, illegal substances, or medicines that are not approved by your provider. Eating and drinking While you're pregnant your body needs extra food for your growing baby. Talk with your provider about what to eat while pregnant. Activity Most women are able to exercise during pregnancy. Exercises may need to change as your pregnancy goes on. Talk to your provider about your activities and exercise routines. Relieving pain and discomfort Wear a good, supportive bra if your breasts  hurt. Rest with your legs raised if you have leg cramps or low back pain. Safety Wear your seatbelt at all times when you're in a car. Talk to your provider if someone hits you, hurts you, or yells at you. Talk with your provider if you're feeling sad or have thoughts of hurting yourself. Lifestyle Certain things can be harmful while you're pregnant. Follow these rules: Do not use hot tubs, steam rooms, or saunas. Do not douche. Do not use tampons or scented pads. Do not drink alcohol,smoke, vape, or use products with nicotine or tobacco in them. If you need help quitting, talk with your provider. Avoid cat litter boxes and soil used by cats. These things carry germs that can cause harm to your pregnancy and your baby. General instructions Keep all follow-up visits. It helps you and your unborn baby stay as healthy as possible. Write down your questions. Take them to your visits. Your provider will: Talk with you about your overall health. Give you advice or refer you to specialists who can help with different needs, including: Prenatal education classes. Mental health and counseling. Foods and healthy eating. Ask for help if you need help with food. Call your dentist and ask to be seen. Brush your teeth with a soft toothbrush. Floss gently. Where to find more information American Pregnancy Association: americanpregnancy.org [employer]: acog.org Office on [employer] Health: [employer] Contact a health care provider if: You feel dizzy, faint, or have a fever. You vomit or have watery poop (diarrhea) for 2  days or more. You have abnormal discharge or bleeding from your vagina. You have pain when you pee or your pee smells bad. You have cramps, pain, or pressure in your belly area. Get help right away if: You have trouble breathing or chest pain. You have any kind of injury, such as from a fall or a car crash. These symptoms may be an  emergency. Get help right away. Call 911. Do not wait to see if the symptoms will go away. Do not drive yourself to the hospital. This information is not intended to replace advice given to you by your health care provider. Make sure you discuss any questions you have with your health care provider. Document Revised: 04/11/2023 Document Reviewed: 11/09/2022 Elsevier Patient Education  2024 Elsevier Inc.   Common Medications Safe in Pregnancy  Acne:      Constipation:  Benzoyl Peroxide     Colace  Clindamycin      Dulcolax Suppository  Topica Erythromycin     Fibercon  Salicylic Acid      Metamucil         Miralax AVOID:        Senakot   Accutane    Cough:  Retin-A       Cough Drops  Tetracycline      Phenergan w/ Codeine if Rx  Minocycline      Robitussin (Plain & DM)  Antibiotics:     Crabs/Lice:  Ceclor       RID  Cephalosporins    AVOID:  E-Mycins      Kwell  Keflex  Macrobid/Macrodantin   Diarrhea:  Penicillin      Kao-Pectate  Zithromax      Imodium AD         PUSH FLUIDS AVOID:       Cipro     Fever:  Tetracycline      Tylenol (Regular or Extra  Minocycline       Strength)  Levaquin      Extra Strength-Do not          Exceed 8 tabs/24 hrs Caffeine:        200mg /day (equiv. To 1 cup of coffee or  approx. 3 12 oz sodas)         Gas: Cold/Hayfever:       Gas-X  Benadryl      Mylicon  Claritin       Phazyme  **Claritin-D        Chlor-Trimeton    Headaches:  Dimetapp      ASA-Free Excedrin  Drixoral-Non-Drowsy     Cold Compress  Mucinex (Guaifenasin)     Tylenol (Regular or Extra  Sudafed/Sudafed-12 Hour     Strength)  **Sudafed PE Pseudoephedrine   Tylenol Cold & Sinus     Vicks Vapor Rub  Zyrtec  **AVOID if Problems With Blood Pressure         Heartburn: Avoid lying down for at least 1 hour after meals  Aciphex      Maalox     Rash:  Milk of Magnesia     Benadryl    Mylanta       1% Hydrocortisone Cream  Pepcid  Pepcid Complete   Sleep  Aids:  Prevacid      Ambien   Prilosec       Benadryl  Rolaids       Chamomile Tea  Tums (Limit 4/day)     Unisom  Tylenol PM         Warm milk-add vanilla or  Hemorrhoids:       Sugar for taste  Anusol/Anusol H.C.  (RX: Analapram 2.5%)  Sugar Substitutes:  Hydrocortisone OTC     Ok in moderation  Preparation H      Tucks        Vaseline lotion applied to tissue with wiping    Herpes:     Throat:  Acyclovir      Oragel  Famvir  Valtrex     Vaccines:         Flu Shot Leg Cramps:       *Gardasil  Benadryl      Hepatitis A         Hepatitis B Nasal Spray:       Pneumovax  Saline Nasal Spray     Polio Booster         Tetanus Nausea:       Tuberculosis test or PPD  Vitamin B6 25 mg TID   AVOID:    Dramamine      *Gardasil  Emetrol       Live Poliovirus  Ginger Root 250 mg QID    MMR (measles, mumps &  High Complex Carbs @ Bedtime    rebella)  Sea Bands-Accupressure    Varicella (Chickenpox)  Unisom 1/2 tab TID     *No known complications           If received before Pain:         Known pregnancy;   Darvocet       Resume series after  Lortab        Delivery  Percocet    Yeast:   Tramadol      Femstat  Tylenol 3      Gyne-lotrimin  Ultram       Monistat  Vicodin           MISC:         All Sunscreens           Hair Coloring/highlights          Insect Repellant's          (Including DEET)         Mystic Tans   Commonly Asked Questions During Pregnancy   Cats: A parasite can be excreted in cat feces.  To avoid exposure you need to have another person empty the little box.  If you must empty the litter box you will need to wear gloves.  Wash your hands after handling your cat.  This parasite can also be found in raw or undercooked meat so this should also be avoided.  Colds, Sore Throats, Flu: Please check your medication sheet to see what you can take for symptoms.  If your symptoms are unrelieved by these medications please call the office.  Dental Work: Most  any dental work Agricultural consultant recommends is permitted.  X-rays should only be taken during the first trimester if absolutely necessary.  Your abdomen should be shielded with a lead apron during all x-rays.  Please notify your provider prior to receiving any x-rays.  Novocaine is fine; gas is not recommended.  If your dentist requires a note from [place] prior to dental work please call the office and we will provide one for you.  Exercise: Exercise is an important part of staying healthy during your pregnancy.  You may continue most exercises you were accustomed to prior to pregnancy.  Later in your pregnancy you will most likely notice you have difficulty with activities requiring balance like riding a bicycle.  It is important that you listen to your body and avoid activities that put you at a higher risk of falling.  Adequate rest and staying well hydrated are a must!  If you have questions about the safety of specific activities ask your provider.    Exposure to Children with illness: Try to avoid obvious exposure; report any symptoms to [place] when noted,  If you have chicken pos, red measles or mumps, you should be immune to these diseases.   Please do not take any vaccines while pregnant unless you have checked with your OB provider.  Fetal Movement: After 28 weeks we recommend you do "kick counts" twice daily.  Lie or sit down in a calm quiet environment and count your baby movements "kicks".  You should feel your baby at least 10 times per hour.  If you have not felt 10 kicks within the first hour get up, walk around and have something sweet to eat or drink then repeat for an additional hour.  If count remains less than 10 per hour notify your provider.  Fumigating: Follow your pest control agent's advice as to how long to stay out of your home.  Ventilate the area well before re-entering.  Hemorrhoids:   Most over-the-counter preparations can be used during pregnancy.  Check your medication to see what is  safe to use.  It is important to use a stool softener or fiber in your diet and to drink lots of liquids.  If hemorrhoids seem to be getting worse please call the office.   Hot Tubs:  Hot tubs Jacuzzis and saunas are not recommended while pregnant.  These increase your internal body temperature and should be avoided.  Intercourse:  Sexual intercourse is safe during pregnancy as long as you are comfortable, unless otherwise advised by your provider.  Spotting may occur after intercourse; report any bright red bleeding that is heavier than spotting.  Labor:  If you know that you are in labor, please go to the hospital.  If you are unsure, please call the office and let us help you decide what to do.  Lifting, straining, etc:  If your job requires heavy lifting or straining please check with your provider for any limitations.  Generally, you should not lift items heavier than that you can lift simply with your hands and arms (no back muscles)  Painting:  Paint fumes do not harm your pregnancy, but may make you ill and should be avoided if possible.  Latex or water based paints have less odor than oils.  Use adequate ventilation while painting.  Permanents & Hair Color:  Chemicals in hair dyes are not recommended as they cause increase hair dryness which can increase hair loss during pregnancy.  " Highlighting" and permanents are allowed.  Dye may be absorbed differently and permanents may not hold as well during pregnancy.  Sunbathing:  Use a sunscreen, as skin burns easily during pregnancy.  Drink plenty of fluids; avoid over heating.  Tanning Beds:  Because their possible side effects are still unknown, tanning beds are not recommended.  Ultrasound Scans:  Routine ultrasounds are performed at approximately 20 weeks.  You will be able to see your baby's general anatomy an if you would like to know the gender this can usually be determined as well.  If it is questionable when you conceived you may  also  receive an ultrasound early in your pregnancy for dating purposes.  Otherwise ultrasound exams are not routinely performed unless there is a medical necessity.  Although you can request a scan we ask that you pay for it when conducted because insurance does not cover " patient request" scans.  Work: If your pregnancy proceeds without complications you may work until your due date, unless your physician or employer advises otherwise.  Round Ligament Pain/Pelvic Discomfort:  Sharp, shooting pains not associated with bleeding are fairly common, usually occurring in the second trimester of pregnancy.  They tend to be worse when standing up or when you remain standing for long periods of time.  These are the result of pressure of certain pelvic ligaments called "round ligaments".  Rest, Tylenol and heat seem to be the most effective relief.  As the womb and fetus grow, they rise out of the pelvis and the discomfort improves.  Please notify the office if your pain seems different than that described.  It may represent a more serious condition.

## 2024-08-13 LAB — EXERCISE TOLERANCE TEST
Angina Index: 0
Estimated workload: 10.1
Exercise duration (min): 8 min
Exercise duration (sec): 0 s
MPHR: 183 {beats}/min
Peak HR: 164 {beats}/min
Percent HR: 89 %
RPE: 15
Rest HR: 76 {beats}/min

## 2024-08-18 ENCOUNTER — Ambulatory Visit: Payer: Self-pay | Admitting: Cardiology

## 2024-08-18 ENCOUNTER — Encounter: Admitting: Gastroenterology

## 2024-08-21 ENCOUNTER — Ambulatory Visit: Admitting: Licensed Clinical Social Worker

## 2024-08-21 DIAGNOSIS — F330 Major depressive disorder, recurrent, mild: Secondary | ICD-10-CM

## 2024-08-21 NOTE — Patient Instructions (Addendum)
 ?? Self-Care Worksheet  Always prioritize medication compliance--if you are prescribed medications, please take medication as prescribed.   ?? Mental & Emotional Self-Care Practice mindfulness or meditation for 5-10 minutes daily Journal your thoughts or feelings Set boundaries to protect your emotional energy Talk to a therapist or counselor Engage in positive self-talk  ?? Physical Self-Care Get 7-9 hours of sleep each night Move your body (walk, stretch, dance, exercise) Eat nourishing meals and stay hydrated Take breaks to rest and recharge Schedule regular health check-ups  ?? Creative Self-Care Try a new hobby or craft Listen to or play music Draw, paint, or color Write poetry or stories [place] or bake something new  ?? Social Self-Care Spend time with supportive friends or family Join a group or community with shared interests Say no when you need to Reach out to someone you trust Plan a fun outing or virtual hangout  ?? Spiritual Self-Care Reflect on your values and beliefs Spend time in nature Read spiritual or inspirational texts Practice gratitude daily Attend a place of worship or spiritual gathering  ?? Environmental Self-Care Adult Nurse a calming corner or sanctuary Use scents, lighting, or music to set a mood Organize your schedule or workspace Reduce digital clutter   Using Behavioral Activation to manage stress/depression symptoms     Identify/understand your own mood triggers.   Structure your day--get up around the same time, eat meals/snacks around the same time, go to bed around the same time.   Purposefully schedule self care time and time to complete tasks. This can include quiet time.  Stimulate your brain--go for a walk, text/call a friend or family member, if you are indoors--go outside (and vice versa), go for a drive, go to a store with bright colors and bright lights. Try to do things in a different way--drive to  your favorite places using an alternative route, or instead of starting on the right side of the grocery store when shopping, start on the left side. You might feel a bit uncomfortable doing things outside of the comfort zone, but this is helping the brain create new neural pathways and is very healthy for brain/emotional health.   Physical movement based on your ability. If you can go for a walk, do stretches, even waving your hands to music can trigger feel-good endorphins in the brain and help release physical tension we all hold in our bodies.  Even 5 minutes can make a difference.   Be intentional about doing things that bring you joy (or used to bring you joy), and look for the things in every day that make you happy.  Seek those glimmers of joy each day.  Set a timer for 5 minutes for a harder task (ex. Laundry, washing dishes).  Allow yourself to work distraction-free for 5 minutes, then stop when the timer goes off. If you need a break, take a break. If you want to continue working then set another timer for whatever time you choose.   Limit or eliminate substance use including alcohol, marijuana, or recreational use of prescription medication.  Let in the light!! Open the window blinds, curtains and let natural light in. Even sitting near a window or sitting outside can boost your mood, especially in the wintertime when there is less daylight.   If you take medication to manage symptoms, remember to take all medications as they are prescribed (please read all labels!!)    Things to envision for ourselves to to improve inspiration, motivation,  and initiative :  improving physical wellness, focus on family relationships, focusing on our own mental/emotional well being, being a part of a bigger community, finding a hobby, being a part of something that fosters personal growth, engaging socially with others (even digitally!!!)

## 2024-08-21 NOTE — BH Specialist Note (Signed)
 Virtual Visit via Video Note  I connected with Kristen Chung on 08/21/24 at  1:00 PM EST by a video enabled telemedicine application and verified that I am speaking with the correct person using two identifiers.  Location: Patient: Primary residence, [place] Provider: Clinician virtual office,    I discussed the limitations of evaluation and management by telemedicine and the availability of in person appointments. The patient expressed understanding and agreed to proceed.   I discussed the assessment and treatment plan with the patient. The patient was provided an opportunity to ask questions and all were answered. The patient agreed with the plan and demonstrated an understanding of the instructions.    Integrated Behavioral Health Follow Up  Visit  MRN: [id number] Name: Kristen Chung  Number of Integrated Behavioral Health Clinician visits: 3- Third Visit  Session Start time: 1300   Session End time: 1330  Total time in minutes: 30  Encounter Diagnosis  Name Primary?   MDD (major depressive disorder), recurrent episode, mild Yes   Types of Service: Telephone visit and Collaborative care  Subjective: Kristen Chung is a 39 y.o. y.o. adult patient with history of depression and anxiety  seen in consultation at the request of Jolynn Spencer PA C for establishment of [facility] Collaborative Care management. Pt is currently taking the following psychiatric medications: lexapro  10 managed by primary care provider.  Pt has been instructed to ask OBGYN about ongoing safety of continuing psychiatric medication due to pregnancy. OBGYN will manage medication until end of pt pregnancy.   Current symptoms include: Pt reports that she feels significant decrease in overall depression symptoms and anxiety since finding out that she is pregnant.  Patient denies HI, or AVH at time of session. Pt reports past social use of cocaine--has not used in over 4 weeks.   Patient reports the following  symptoms/concerns: Low energy, vivid dreams, irritability when triggered  Duration of problem: ongoing--improving since last session; Severity of problem: mild  Objective: Mood: Euthymic and Affect: Appropriate Risk of harm to self or others: No plan to harm self or others  Life Context: Family and Social: Pt reports that family members are supportive of her, and that her partner is very supportive of them expecting a child. Pt reports that he has been very in tune with her and her needs. Pt reports positive relationship.   School/Work: n/a.  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  Self-Care: Pt reports that she is being intentional about self-care focus. Pt has set limits with people that trigger stress and   Life Changes: Pt recently found out that she is [redacted] weeks pregnant and is very excited about the news. Pt reports that she will be a high-risk pregnancy but is very happy about her future   Patient and/or Family's Strengths/Protective Factors: Social connections, Concrete supports in place (healthy food, safe environments, etc.), and Physical Health (exercise, healthy diet, medication compliance, etc.)  Goals Addressed: Patient will:  Reduce symptoms of: stress   Increase knowledge and/or ability of: coping skills, healthy habits, self-management skills, and stress reduction   Demonstrate ability to: Increase healthy adjustment to current life circumstances  Progress towards Goals: Ongoing  Interventions: Interventions utilized:  Supportive Counseling Standardized Assessments completed: C-SSRS Short, GAD-7, and PHQ 9     08/21/2024    1:22 PM 08/03/2024   10:42 AM 02/20/2024   11:02 AM  Depression screen PHQ 2/9  Decreased Interest 1  2 0  Down, Depressed, Hopeless 0 2 1  PHQ - 2 Score 1 4 1   Altered sleeping 1 3 3   Tired, decreased energy 3 3 2   Change in appetite 0 2 2  Feeling bad or failure about yourself  0 2 1  Trouble  concentrating 0 3 3  Moving slowly or fidgety/restless 0 3 2  Suicidal thoughts 0 1 1  PHQ-9 Score 5 21 15    Difficult doing work/chores   Somewhat difficult     Data saved with a previous flowsheet row definition      08/21/2024    1:27 PM 08/03/2024   10:47 AM 02/20/2024   11:02 AM 01/17/2024   11:43 AM  GAD 7 : Generalized Anxiety Score  Nervous, Anxious, on Edge 0 2  2  2    Control/stop worrying 0 2  3  2    Worry too much - different things 0 2  3  3    Trouble relaxing 0 3  2  2    Restless 0 3  2  2    Easily annoyed or irritable 1 3  3  2    Afraid - awful might happen 0 3  2  0   Total GAD 7 Score 1 18 17 13   Anxiety Difficulty   Somewhat difficult Somewhat difficult     Data saved with a previous flowsheet row definition    Flowsheet Row Integrated Behavioral Health from 08/21/2024 in [facility] Integrated [facility]al Health from 08/03/2024 in [facility] ED from 10/21/2022 in [facility] at [facility]  C-SSRS RISK CATEGORY Moderate Risk Error: Question 2 not populated No Risk    Patient and/or Family Response: Pt reports that she feels a significant improvement in her overall mood and anxiety/stress levels. Pt feel current stressor is her family members that are not happy for her and for the pregnancy.  Patient Centered Plan: Patient is on the following Treatment Plan(s):  Defer all psychiatric medication management to [facility] specialist to follow up  Clinical Assessment/Diagnosis Encounter Diagnosis  Name Primary?   MDD (major depressive disorder), recurrent episode, mild Yes   Assessment: IBH clinician reviewed Kristen Chung 's PHQ-9, GAD-7, and CSSRS scores. Scores reflect improvement in overall symptoms related to depression and anxiety.   IBH clinician explored current external stressors that pt is experiencing and reviewed coping skills that patient is utilizing and provided  encouragement and recommendations of alternative coping skills to support continued symptom management.   IBH clinician provided Kristen Chung with psychoeducational materials in after visit summary, and instructed pt to review materials through their confidential MyChart portal.   IBH clinician encouraging continued [facility] Collaborative Care Team supports including counseling and medication management as needed.    Plan: Follow up with behavioral health clinician on : 09/04/24 Behavioral recommendations:  Consult w/ OBGYN re: vitamin D levels and for psychiatric medication management. Be intentional with self care strategies Continue Behavioral Activation for managing mood and stress Referral(s): Integrated [employer] (In Clinic)  [place] R Sabastian Raimondi, LCSW

## 2024-08-23 ENCOUNTER — Other Ambulatory Visit: Payer: Self-pay

## 2024-08-23 ENCOUNTER — Emergency Department
Admission: EM | Admit: 2024-08-23 | Discharge: 2024-08-23 | Disposition: A | Discharge status: Home / Self Care | Attending: Emergency Medicine | Admitting: Emergency Medicine

## 2024-08-23 ENCOUNTER — Emergency Department

## 2024-08-23 DIAGNOSIS — R1030 Lower abdominal pain, unspecified: Secondary | ICD-10-CM | POA: Diagnosis not present

## 2024-08-23 DIAGNOSIS — W19XXXA Unspecified fall, initial encounter: Secondary | ICD-10-CM

## 2024-08-23 DIAGNOSIS — Z3A08 8 weeks gestation of pregnancy: Secondary | ICD-10-CM | POA: Diagnosis not present

## 2024-08-23 DIAGNOSIS — O9A211 Injury, poisoning and certain other consequences of external causes complicating pregnancy, first trimester: Secondary | ICD-10-CM | POA: Diagnosis present

## 2024-08-23 DIAGNOSIS — O09521 Supervision of elderly multigravida, first trimester: Secondary | ICD-10-CM | POA: Insufficient documentation

## 2024-08-23 DIAGNOSIS — Z3491 Encounter for supervision of normal pregnancy, unspecified, first trimester: Secondary | ICD-10-CM

## 2024-08-23 LAB — COMPREHENSIVE METABOLIC PANEL WITH GFR
ALT: 8 U/L (ref 0–44)
AST: 15 U/L (ref 15–41)
Albumin: 4.3 g/dL (ref 3.5–5.0)
Alkaline Phosphatase: 42 U/L (ref 38–126)
Anion gap: 13 (ref 5–15)
BUN: 9 mg/dL (ref 6–20)
CO2: 19 mmol/L — ABNORMAL LOW (ref 22–32)
Calcium: 9.4 mg/dL (ref 8.9–10.3)
Chloride: 102 mmol/L (ref 98–111)
Creatinine, Ser: 0.54 mg/dL (ref 0.44–1.00)
GFR, Estimated: >60 mL/min
Glucose, Bld: 171 mg/dL — ABNORMAL HIGH (ref 70–99)
Potassium: 3.4 mmol/L — ABNORMAL LOW (ref 3.5–5.1)
Sodium: 134 mmol/L — ABNORMAL LOW (ref 135–145)
Total Bilirubin: 0.3 mg/dL (ref 0.0–1.2)
Total Protein: 7.3 g/dL (ref 6.5–8.1)

## 2024-08-23 LAB — CBC WITH DIFFERENTIAL/PLATELET
Abs Immature Granulocytes: 0 10*3/uL (ref 0.00–0.07)
Basophils Absolute: 0 10*3/uL (ref 0.0–0.1)
Basophils Relative: 0 %
Eosinophils Absolute: 0 10*3/uL (ref 0.0–0.5)
Eosinophils Relative: 0 %
HCT: 36.8 % (ref 36.0–46.0)
Hemoglobin: 12.1 g/dL (ref 12.0–15.0)
Immature Granulocytes: 0 %
Lymphocytes Relative: 29 %
Lymphs Abs: 1.6 10*3/uL (ref 0.7–4.0)
MCH: 28 pg (ref 26.0–34.0)
MCHC: 32.9 g/dL (ref 30.0–36.0)
MCV: 85.2 fL (ref 80.0–100.0)
Monocytes Absolute: 0.4 10*3/uL (ref 0.1–1.0)
Monocytes Relative: 6 %
Neutro Abs: 3.5 10*3/uL (ref 1.7–7.7)
Neutrophils Relative %: 65 %
Platelets: 300 10*3/uL (ref 150–400)
RBC: 4.32 MIL/uL (ref 3.87–5.11)
RDW: 11.8 % (ref 11.5–15.5)
WBC: 5.5 10*3/uL (ref 4.0–10.5)
nRBC: 0 % (ref 0.0–0.2)

## 2024-08-23 LAB — HCG, QUANTITATIVE, PREGNANCY: hCG, Beta Chain, Quant, S: 126843 m[IU]/mL — ABNORMAL HIGH

## 2024-08-23 NOTE — ED Triage Notes (Signed)
 Pt come with slip and fall today. Pt states she hurt lower belly. Pt states she is [redacted] weeks pregnant.

## 2024-08-23 NOTE — ED Notes (Signed)
"  Urine sent down  "

## 2024-08-23 NOTE — ED Notes (Signed)
 Sent pink tube in case ABO ordered.

## 2024-08-23 NOTE — ED Provider Notes (Signed)
 "  [facility] Provider Note    Event Date/Time   First MD Initiated Contact with Patient 08/23/24 1739     (approximate)   History   Fall   HPI  Kristen Chung is a 39 y.o. female who reports she is approximately [redacted] weeks pregnant, she reports she slipped on her stairs and fell onto her bottom, she denies abdominal pain or vaginal bleeding but was concerned about her pregnancy.     Physical Exam   Triage Vital Signs: ED Triage Vitals  Encounter Vitals Group     BP 08/23/24 1415 (!) 149/98     Girls Systolic BP Percentile --      Girls Diastolic BP Percentile --      Boys Systolic BP Percentile --      Boys Diastolic BP Percentile --      Pulse Rate 08/23/24 1415 (!) 113     Resp 08/23/24 1415 18     Temp 08/23/24 1415 97.8 F (36.6 C)     Temp src --      SpO2 08/23/24 1415 97 %     Weight 08/23/24 1415 74.4 kg (164 lb)     Height 08/23/24 1415 1.575 m (5' 2)     Head Circumference --      Peak Flow --      Pain Score 08/23/24 1815 0     Pain Loc --      Pain Education --      Exclude from Growth Chart --     Most recent vital signs: Vitals:   08/23/24 1415  BP: (!) 149/98  Pulse: (!) 113  Resp: 18  Temp: 97.8 F (36.6 C)  SpO2: 97%     General: Awake, no distress.  CV:  Good peripheral perfusion.  Resp:  Normal effort.  Abd:  No distention.  Soft, nontender Other:  , Reassuring exam, no injury   ED Results / Procedures / Treatments   Labs (all labs ordered are listed, but only abnormal results are displayed) Labs Reviewed  COMPREHENSIVE METABOLIC PANEL WITH GFR - Abnormal; Notable for the following components:      Result Value   Sodium 134 (*)    Potassium 3.4 (*)    CO2 19 (*)    Glucose, Bld 171 (*)    All other components within normal limits  HCG, QUANTITATIVE, PREGNANCY - Abnormal; Notable for the following components:   hCG, Beta Chain, Earleen RAMAN [phone] (*)    All other components within normal limits  CBC  WITH DIFFERENTIAL/PLATELET     EKG     RADIOLOGY Ultrasound demonstrates a week IUP    PROCEDURES:  Critical Care performed:   Procedures   MEDICATIONS ORDERED IN ED: Medications - No data to display   IMPRESSION / MDM / ASSESSMENT AND PLAN / ED COURSE  I reviewed the triage vital signs and the nursing notes. Patient's presentation is most consistent with acute complicated illness / injury requiring diagnostic workup.   Patient presents after a fall, overall well-appearing on exam, ultrasound obtained which is unremarkable, she is reassured by this, no vaginal bleeding or abdominal pain, appropriate for discharge with outpatient follow-up       FINAL CLINICAL IMPRESSION(S) / ED DIAGNOSES   Final diagnoses:  Fall, initial encounter  First trimester pregnancy     Rx / DC Orders   ED Discharge Orders     None        Note:  This document was prepared using Dragon voice recognition software and may include unintentional dictation errors.   Arlander Charleston, MD 08/23/24 2016  "

## 2024-08-25 NOTE — Addendum Note (Signed)
 Addended by: Ja Pistole on: 08/25/2024 09:26 PM   Modules accepted: Orders

## 2024-08-26 ENCOUNTER — Encounter

## 2024-09-01 ENCOUNTER — Encounter: Admitting: Obstetrics & Gynecology

## 2024-09-04 ENCOUNTER — Ambulatory Visit: Admitting: Licensed Clinical Social Worker

## 2024-09-22 ENCOUNTER — Encounter: Admitting: Obstetrics & Gynecology
